# Patient Record
Sex: Female | Born: 1945 | Hispanic: No | Marital: Single | Smoking: Former smoker
Health system: Southern US, Community
[De-identification: ages and names within clinical notes are randomized; demographics above are authoritative.]

## PROBLEM LIST (undated history)

## (undated) DIAGNOSIS — I7381 Erythromelalgia: Secondary | ICD-10-CM

## (undated) DIAGNOSIS — F418 Other specified anxiety disorders: Secondary | ICD-10-CM

## (undated) DIAGNOSIS — C801 Malignant (primary) neoplasm, unspecified: Secondary | ICD-10-CM

## (undated) DIAGNOSIS — R519 Headache, unspecified: Secondary | ICD-10-CM

## (undated) DIAGNOSIS — I82409 Acute embolism and thrombosis of unspecified deep veins of unspecified lower extremity: Secondary | ICD-10-CM

## (undated) DIAGNOSIS — M1990 Unspecified osteoarthritis, unspecified site: Secondary | ICD-10-CM

## (undated) DIAGNOSIS — K5903 Drug induced constipation: Secondary | ICD-10-CM

## (undated) DIAGNOSIS — N189 Chronic kidney disease, unspecified: Secondary | ICD-10-CM

## (undated) DIAGNOSIS — Z9289 Personal history of other medical treatment: Secondary | ICD-10-CM

## (undated) DIAGNOSIS — I499 Cardiac arrhythmia, unspecified: Secondary | ICD-10-CM

## (undated) DIAGNOSIS — K219 Gastro-esophageal reflux disease without esophagitis: Secondary | ICD-10-CM

## (undated) DIAGNOSIS — I959 Hypotension, unspecified: Secondary | ICD-10-CM

## (undated) DIAGNOSIS — I059 Rheumatic mitral valve disease, unspecified: Secondary | ICD-10-CM

## (undated) HISTORY — PX: COLONOSCOPY: SHX174

## (undated) HISTORY — PX: AUGMENTATION MAMMAPLASTY: SUR837

## (undated) HISTORY — PX: EYE SURGERY: SHX253

## (undated) HISTORY — PX: MITRAL VALVE REPAIR: SHX2039

---

## 2011-03-29 HISTORY — PX: TOTAL SHOULDER ARTHROPLASTY: SHX126

## 2015-04-01 DIAGNOSIS — F411 Generalized anxiety disorder: Secondary | ICD-10-CM | POA: Diagnosis not present

## 2015-04-01 DIAGNOSIS — F4310 Post-traumatic stress disorder, unspecified: Secondary | ICD-10-CM | POA: Diagnosis not present

## 2015-04-01 DIAGNOSIS — F331 Major depressive disorder, recurrent, moderate: Secondary | ICD-10-CM | POA: Diagnosis not present

## 2015-04-08 DIAGNOSIS — F411 Generalized anxiety disorder: Secondary | ICD-10-CM | POA: Diagnosis not present

## 2015-04-08 DIAGNOSIS — F331 Major depressive disorder, recurrent, moderate: Secondary | ICD-10-CM | POA: Diagnosis not present

## 2015-04-08 DIAGNOSIS — F4310 Post-traumatic stress disorder, unspecified: Secondary | ICD-10-CM | POA: Diagnosis not present

## 2015-04-15 DIAGNOSIS — F411 Generalized anxiety disorder: Secondary | ICD-10-CM | POA: Diagnosis not present

## 2015-04-15 DIAGNOSIS — F331 Major depressive disorder, recurrent, moderate: Secondary | ICD-10-CM | POA: Diagnosis not present

## 2015-04-15 DIAGNOSIS — F4310 Post-traumatic stress disorder, unspecified: Secondary | ICD-10-CM | POA: Diagnosis not present

## 2015-04-16 DIAGNOSIS — F411 Generalized anxiety disorder: Secondary | ICD-10-CM | POA: Diagnosis not present

## 2015-04-16 DIAGNOSIS — F331 Major depressive disorder, recurrent, moderate: Secondary | ICD-10-CM | POA: Diagnosis not present

## 2015-04-16 DIAGNOSIS — F4310 Post-traumatic stress disorder, unspecified: Secondary | ICD-10-CM | POA: Diagnosis not present

## 2015-04-27 DIAGNOSIS — R001 Bradycardia, unspecified: Secondary | ICD-10-CM | POA: Diagnosis not present

## 2015-04-27 DIAGNOSIS — I4891 Unspecified atrial fibrillation: Secondary | ICD-10-CM | POA: Diagnosis not present

## 2015-04-27 DIAGNOSIS — Z01818 Encounter for other preprocedural examination: Secondary | ICD-10-CM | POA: Diagnosis not present

## 2015-04-27 DIAGNOSIS — Z79891 Long term (current) use of opiate analgesic: Secondary | ICD-10-CM | POA: Diagnosis not present

## 2015-04-29 DIAGNOSIS — F331 Major depressive disorder, recurrent, moderate: Secondary | ICD-10-CM | POA: Diagnosis not present

## 2015-04-29 DIAGNOSIS — F4310 Post-traumatic stress disorder, unspecified: Secondary | ICD-10-CM | POA: Diagnosis not present

## 2015-04-29 DIAGNOSIS — F411 Generalized anxiety disorder: Secondary | ICD-10-CM | POA: Diagnosis not present

## 2015-05-01 DIAGNOSIS — I10 Essential (primary) hypertension: Secondary | ICD-10-CM | POA: Diagnosis not present

## 2015-05-01 DIAGNOSIS — Z4542 Encounter for adjustment and management of neuropacemaker (brain) (peripheral nerve) (spinal cord): Secondary | ICD-10-CM | POA: Diagnosis not present

## 2015-05-01 DIAGNOSIS — K219 Gastro-esophageal reflux disease without esophagitis: Secondary | ICD-10-CM | POA: Diagnosis not present

## 2015-05-01 DIAGNOSIS — I4891 Unspecified atrial fibrillation: Secondary | ICD-10-CM | POA: Diagnosis not present

## 2015-05-01 DIAGNOSIS — Z79899 Other long term (current) drug therapy: Secondary | ICD-10-CM | POA: Diagnosis not present

## 2015-05-01 DIAGNOSIS — Z87891 Personal history of nicotine dependence: Secondary | ICD-10-CM | POA: Diagnosis not present

## 2015-05-01 DIAGNOSIS — Z462 Encounter for fitting and adjustment of other devices related to nervous system and special senses: Secondary | ICD-10-CM | POA: Diagnosis not present

## 2015-05-01 DIAGNOSIS — I7381 Erythromelalgia: Secondary | ICD-10-CM | POA: Diagnosis not present

## 2015-05-01 DIAGNOSIS — F329 Major depressive disorder, single episode, unspecified: Secondary | ICD-10-CM | POA: Diagnosis not present

## 2015-05-01 DIAGNOSIS — M549 Dorsalgia, unspecified: Secondary | ICD-10-CM | POA: Diagnosis not present

## 2015-05-01 DIAGNOSIS — Z7982 Long term (current) use of aspirin: Secondary | ICD-10-CM | POA: Diagnosis not present

## 2015-05-11 DIAGNOSIS — I7381 Erythromelalgia: Secondary | ICD-10-CM | POA: Diagnosis not present

## 2015-05-11 DIAGNOSIS — Z79899 Other long term (current) drug therapy: Secondary | ICD-10-CM | POA: Diagnosis not present

## 2015-05-14 DIAGNOSIS — F411 Generalized anxiety disorder: Secondary | ICD-10-CM | POA: Diagnosis not present

## 2015-05-14 DIAGNOSIS — F331 Major depressive disorder, recurrent, moderate: Secondary | ICD-10-CM | POA: Diagnosis not present

## 2015-05-14 DIAGNOSIS — F4310 Post-traumatic stress disorder, unspecified: Secondary | ICD-10-CM | POA: Diagnosis not present

## 2015-05-28 DIAGNOSIS — F4310 Post-traumatic stress disorder, unspecified: Secondary | ICD-10-CM | POA: Diagnosis not present

## 2015-05-28 DIAGNOSIS — F411 Generalized anxiety disorder: Secondary | ICD-10-CM | POA: Diagnosis not present

## 2015-05-28 DIAGNOSIS — F331 Major depressive disorder, recurrent, moderate: Secondary | ICD-10-CM | POA: Diagnosis not present

## 2015-06-04 DIAGNOSIS — I7381 Erythromelalgia: Secondary | ICD-10-CM | POA: Diagnosis not present

## 2015-06-04 DIAGNOSIS — L821 Other seborrheic keratosis: Secondary | ICD-10-CM | POA: Diagnosis not present

## 2015-06-09 DIAGNOSIS — F4310 Post-traumatic stress disorder, unspecified: Secondary | ICD-10-CM | POA: Diagnosis not present

## 2015-06-09 DIAGNOSIS — F411 Generalized anxiety disorder: Secondary | ICD-10-CM | POA: Diagnosis not present

## 2015-06-09 DIAGNOSIS — F331 Major depressive disorder, recurrent, moderate: Secondary | ICD-10-CM | POA: Diagnosis not present

## 2015-06-12 DIAGNOSIS — M5137 Other intervertebral disc degeneration, lumbosacral region: Secondary | ICD-10-CM | POA: Diagnosis not present

## 2015-06-12 DIAGNOSIS — M5432 Sciatica, left side: Secondary | ICD-10-CM | POA: Diagnosis not present

## 2015-06-12 DIAGNOSIS — Z79891 Long term (current) use of opiate analgesic: Secondary | ICD-10-CM | POA: Diagnosis not present

## 2015-06-12 DIAGNOSIS — M5416 Radiculopathy, lumbar region: Secondary | ICD-10-CM | POA: Diagnosis not present

## 2015-06-12 DIAGNOSIS — M5430 Sciatica, unspecified side: Secondary | ICD-10-CM | POA: Diagnosis not present

## 2015-06-12 DIAGNOSIS — Z9689 Presence of other specified functional implants: Secondary | ICD-10-CM | POA: Diagnosis not present

## 2015-06-12 DIAGNOSIS — M5117 Intervertebral disc disorders with radiculopathy, lumbosacral region: Secondary | ICD-10-CM | POA: Diagnosis not present

## 2015-06-12 DIAGNOSIS — I7381 Erythromelalgia: Secondary | ICD-10-CM | POA: Diagnosis not present

## 2015-06-12 DIAGNOSIS — M4317 Spondylolisthesis, lumbosacral region: Secondary | ICD-10-CM | POA: Diagnosis not present

## 2015-06-17 DIAGNOSIS — F411 Generalized anxiety disorder: Secondary | ICD-10-CM | POA: Diagnosis not present

## 2015-06-17 DIAGNOSIS — F331 Major depressive disorder, recurrent, moderate: Secondary | ICD-10-CM | POA: Diagnosis not present

## 2015-06-17 DIAGNOSIS — F4310 Post-traumatic stress disorder, unspecified: Secondary | ICD-10-CM | POA: Diagnosis not present

## 2015-06-19 DIAGNOSIS — I10 Essential (primary) hypertension: Secondary | ICD-10-CM | POA: Diagnosis not present

## 2015-06-19 DIAGNOSIS — R600 Localized edema: Secondary | ICD-10-CM | POA: Diagnosis not present

## 2015-06-19 DIAGNOSIS — F419 Anxiety disorder, unspecified: Secondary | ICD-10-CM | POA: Diagnosis not present

## 2015-06-24 DIAGNOSIS — F4310 Post-traumatic stress disorder, unspecified: Secondary | ICD-10-CM | POA: Diagnosis not present

## 2015-06-24 DIAGNOSIS — F411 Generalized anxiety disorder: Secondary | ICD-10-CM | POA: Diagnosis not present

## 2015-06-24 DIAGNOSIS — F331 Major depressive disorder, recurrent, moderate: Secondary | ICD-10-CM | POA: Diagnosis not present

## 2015-06-26 DIAGNOSIS — F4310 Post-traumatic stress disorder, unspecified: Secondary | ICD-10-CM | POA: Diagnosis not present

## 2015-06-26 DIAGNOSIS — F411 Generalized anxiety disorder: Secondary | ICD-10-CM | POA: Diagnosis not present

## 2015-06-26 DIAGNOSIS — F331 Major depressive disorder, recurrent, moderate: Secondary | ICD-10-CM | POA: Diagnosis not present

## 2015-06-27 DIAGNOSIS — R4182 Altered mental status, unspecified: Secondary | ICD-10-CM | POA: Diagnosis not present

## 2015-06-27 DIAGNOSIS — S0990XA Unspecified injury of head, initial encounter: Secondary | ICD-10-CM | POA: Diagnosis not present

## 2015-06-27 DIAGNOSIS — Z87891 Personal history of nicotine dependence: Secondary | ICD-10-CM | POA: Diagnosis not present

## 2015-06-27 DIAGNOSIS — M25559 Pain in unspecified hip: Secondary | ICD-10-CM | POA: Diagnosis not present

## 2015-06-27 DIAGNOSIS — S299XXA Unspecified injury of thorax, initial encounter: Secondary | ICD-10-CM | POA: Diagnosis not present

## 2015-06-27 DIAGNOSIS — R220 Localized swelling, mass and lump, head: Secondary | ICD-10-CM | POA: Diagnosis not present

## 2015-06-27 DIAGNOSIS — Z79899 Other long term (current) drug therapy: Secondary | ICD-10-CM | POA: Diagnosis not present

## 2015-06-27 DIAGNOSIS — S199XXA Unspecified injury of neck, initial encounter: Secondary | ICD-10-CM | POA: Diagnosis not present

## 2015-06-27 DIAGNOSIS — M8580 Other specified disorders of bone density and structure, unspecified site: Secondary | ICD-10-CM | POA: Diagnosis not present

## 2015-06-27 DIAGNOSIS — R443 Hallucinations, unspecified: Secondary | ICD-10-CM | POA: Diagnosis not present

## 2015-06-27 DIAGNOSIS — G8929 Other chronic pain: Secondary | ICD-10-CM | POA: Diagnosis not present

## 2015-06-30 DIAGNOSIS — F331 Major depressive disorder, recurrent, moderate: Secondary | ICD-10-CM | POA: Diagnosis not present

## 2015-06-30 DIAGNOSIS — F4310 Post-traumatic stress disorder, unspecified: Secondary | ICD-10-CM | POA: Diagnosis not present

## 2015-06-30 DIAGNOSIS — F411 Generalized anxiety disorder: Secondary | ICD-10-CM | POA: Diagnosis not present

## 2015-07-07 DIAGNOSIS — Z5181 Encounter for therapeutic drug level monitoring: Secondary | ICD-10-CM | POA: Diagnosis not present

## 2015-07-07 DIAGNOSIS — G894 Chronic pain syndrome: Secondary | ICD-10-CM | POA: Diagnosis not present

## 2015-07-07 DIAGNOSIS — I7381 Erythromelalgia: Secondary | ICD-10-CM | POA: Diagnosis not present

## 2015-07-07 DIAGNOSIS — Z79899 Other long term (current) drug therapy: Secondary | ICD-10-CM | POA: Diagnosis not present

## 2015-07-10 DIAGNOSIS — F331 Major depressive disorder, recurrent, moderate: Secondary | ICD-10-CM | POA: Diagnosis not present

## 2015-07-10 DIAGNOSIS — F411 Generalized anxiety disorder: Secondary | ICD-10-CM | POA: Diagnosis not present

## 2015-07-10 DIAGNOSIS — F4310 Post-traumatic stress disorder, unspecified: Secondary | ICD-10-CM | POA: Diagnosis not present

## 2015-07-20 DIAGNOSIS — F4310 Post-traumatic stress disorder, unspecified: Secondary | ICD-10-CM | POA: Diagnosis not present

## 2015-07-20 DIAGNOSIS — F411 Generalized anxiety disorder: Secondary | ICD-10-CM | POA: Diagnosis not present

## 2015-07-20 DIAGNOSIS — F331 Major depressive disorder, recurrent, moderate: Secondary | ICD-10-CM | POA: Diagnosis not present

## 2015-07-28 DIAGNOSIS — F411 Generalized anxiety disorder: Secondary | ICD-10-CM | POA: Diagnosis not present

## 2015-07-28 DIAGNOSIS — F331 Major depressive disorder, recurrent, moderate: Secondary | ICD-10-CM | POA: Diagnosis not present

## 2015-07-28 DIAGNOSIS — F4310 Post-traumatic stress disorder, unspecified: Secondary | ICD-10-CM | POA: Diagnosis not present

## 2015-08-03 DIAGNOSIS — H43393 Other vitreous opacities, bilateral: Secondary | ICD-10-CM | POA: Diagnosis not present

## 2015-08-03 DIAGNOSIS — Z821 Family history of blindness and visual loss: Secondary | ICD-10-CM | POA: Diagnosis not present

## 2015-08-03 DIAGNOSIS — H2513 Age-related nuclear cataract, bilateral: Secondary | ICD-10-CM | POA: Diagnosis not present

## 2015-08-07 DIAGNOSIS — Z79899 Other long term (current) drug therapy: Secondary | ICD-10-CM | POA: Diagnosis not present

## 2015-08-07 DIAGNOSIS — I7381 Erythromelalgia: Secondary | ICD-10-CM | POA: Diagnosis not present

## 2015-08-07 DIAGNOSIS — F411 Generalized anxiety disorder: Secondary | ICD-10-CM | POA: Diagnosis not present

## 2015-08-07 DIAGNOSIS — Z79891 Long term (current) use of opiate analgesic: Secondary | ICD-10-CM | POA: Diagnosis not present

## 2015-08-07 DIAGNOSIS — M5432 Sciatica, left side: Secondary | ICD-10-CM | POA: Diagnosis not present

## 2015-08-07 DIAGNOSIS — M5416 Radiculopathy, lumbar region: Secondary | ICD-10-CM | POA: Diagnosis not present

## 2015-08-07 DIAGNOSIS — M4316 Spondylolisthesis, lumbar region: Secondary | ICD-10-CM | POA: Diagnosis not present

## 2015-08-07 DIAGNOSIS — F4310 Post-traumatic stress disorder, unspecified: Secondary | ICD-10-CM | POA: Diagnosis not present

## 2015-08-07 DIAGNOSIS — M5136 Other intervertebral disc degeneration, lumbar region: Secondary | ICD-10-CM | POA: Diagnosis not present

## 2015-08-07 DIAGNOSIS — M5442 Lumbago with sciatica, left side: Secondary | ICD-10-CM | POA: Diagnosis not present

## 2015-08-07 DIAGNOSIS — F331 Major depressive disorder, recurrent, moderate: Secondary | ICD-10-CM | POA: Diagnosis not present

## 2015-08-07 DIAGNOSIS — M4317 Spondylolisthesis, lumbosacral region: Secondary | ICD-10-CM | POA: Diagnosis not present

## 2015-08-07 DIAGNOSIS — M5117 Intervertebral disc disorders with radiculopathy, lumbosacral region: Secondary | ICD-10-CM | POA: Diagnosis not present

## 2015-08-07 DIAGNOSIS — Z9689 Presence of other specified functional implants: Secondary | ICD-10-CM | POA: Diagnosis not present

## 2015-08-11 DIAGNOSIS — G894 Chronic pain syndrome: Secondary | ICD-10-CM | POA: Diagnosis not present

## 2015-08-11 DIAGNOSIS — I7381 Erythromelalgia: Secondary | ICD-10-CM | POA: Diagnosis not present

## 2015-08-14 DIAGNOSIS — F331 Major depressive disorder, recurrent, moderate: Secondary | ICD-10-CM | POA: Diagnosis not present

## 2015-08-14 DIAGNOSIS — F411 Generalized anxiety disorder: Secondary | ICD-10-CM | POA: Diagnosis not present

## 2015-08-14 DIAGNOSIS — F4310 Post-traumatic stress disorder, unspecified: Secondary | ICD-10-CM | POA: Diagnosis not present

## 2015-08-17 DIAGNOSIS — M79604 Pain in right leg: Secondary | ICD-10-CM | POA: Diagnosis not present

## 2015-08-17 DIAGNOSIS — I10 Essential (primary) hypertension: Secondary | ICD-10-CM | POA: Diagnosis not present

## 2015-08-17 DIAGNOSIS — F329 Major depressive disorder, single episode, unspecified: Secondary | ICD-10-CM | POA: Diagnosis not present

## 2015-08-20 DIAGNOSIS — F4310 Post-traumatic stress disorder, unspecified: Secondary | ICD-10-CM | POA: Diagnosis not present

## 2015-08-20 DIAGNOSIS — F411 Generalized anxiety disorder: Secondary | ICD-10-CM | POA: Diagnosis not present

## 2015-08-20 DIAGNOSIS — F331 Major depressive disorder, recurrent, moderate: Secondary | ICD-10-CM | POA: Diagnosis not present

## 2015-08-21 DIAGNOSIS — F331 Major depressive disorder, recurrent, moderate: Secondary | ICD-10-CM | POA: Diagnosis not present

## 2015-08-21 DIAGNOSIS — F411 Generalized anxiety disorder: Secondary | ICD-10-CM | POA: Diagnosis not present

## 2015-08-21 DIAGNOSIS — F4310 Post-traumatic stress disorder, unspecified: Secondary | ICD-10-CM | POA: Diagnosis not present

## 2015-08-25 DIAGNOSIS — I7381 Erythromelalgia: Secondary | ICD-10-CM | POA: Diagnosis not present

## 2015-08-25 DIAGNOSIS — G894 Chronic pain syndrome: Secondary | ICD-10-CM | POA: Diagnosis not present

## 2015-09-01 DIAGNOSIS — F411 Generalized anxiety disorder: Secondary | ICD-10-CM | POA: Diagnosis not present

## 2015-09-01 DIAGNOSIS — F4310 Post-traumatic stress disorder, unspecified: Secondary | ICD-10-CM | POA: Diagnosis not present

## 2015-09-01 DIAGNOSIS — F331 Major depressive disorder, recurrent, moderate: Secondary | ICD-10-CM | POA: Diagnosis not present

## 2015-09-04 DIAGNOSIS — F331 Major depressive disorder, recurrent, moderate: Secondary | ICD-10-CM | POA: Diagnosis not present

## 2015-09-04 DIAGNOSIS — F411 Generalized anxiety disorder: Secondary | ICD-10-CM | POA: Diagnosis not present

## 2015-09-04 DIAGNOSIS — F4310 Post-traumatic stress disorder, unspecified: Secondary | ICD-10-CM | POA: Diagnosis not present

## 2015-09-16 DIAGNOSIS — F411 Generalized anxiety disorder: Secondary | ICD-10-CM | POA: Diagnosis not present

## 2015-09-16 DIAGNOSIS — F4310 Post-traumatic stress disorder, unspecified: Secondary | ICD-10-CM | POA: Diagnosis not present

## 2015-09-16 DIAGNOSIS — F331 Major depressive disorder, recurrent, moderate: Secondary | ICD-10-CM | POA: Diagnosis not present

## 2015-09-22 DIAGNOSIS — I7381 Erythromelalgia: Secondary | ICD-10-CM | POA: Diagnosis not present

## 2015-09-22 DIAGNOSIS — M25572 Pain in left ankle and joints of left foot: Secondary | ICD-10-CM | POA: Diagnosis not present

## 2015-09-22 DIAGNOSIS — G894 Chronic pain syndrome: Secondary | ICD-10-CM | POA: Diagnosis not present

## 2015-09-25 DIAGNOSIS — M25472 Effusion, left ankle: Secondary | ICD-10-CM | POA: Diagnosis not present

## 2015-09-25 DIAGNOSIS — M25572 Pain in left ankle and joints of left foot: Secondary | ICD-10-CM | POA: Diagnosis not present

## 2015-10-02 DIAGNOSIS — M5416 Radiculopathy, lumbar region: Secondary | ICD-10-CM | POA: Diagnosis not present

## 2015-10-02 DIAGNOSIS — I7381 Erythromelalgia: Secondary | ICD-10-CM | POA: Diagnosis not present

## 2015-10-02 DIAGNOSIS — M5116 Intervertebral disc disorders with radiculopathy, lumbar region: Secondary | ICD-10-CM | POA: Diagnosis not present

## 2015-10-02 DIAGNOSIS — Z87891 Personal history of nicotine dependence: Secondary | ICD-10-CM | POA: Diagnosis not present

## 2015-10-02 DIAGNOSIS — Z79899 Other long term (current) drug therapy: Secondary | ICD-10-CM | POA: Diagnosis not present

## 2015-10-02 DIAGNOSIS — Z9689 Presence of other specified functional implants: Secondary | ICD-10-CM | POA: Diagnosis not present

## 2015-10-02 DIAGNOSIS — M5137 Other intervertebral disc degeneration, lumbosacral region: Secondary | ICD-10-CM | POA: Diagnosis not present

## 2015-10-02 DIAGNOSIS — M5432 Sciatica, left side: Secondary | ICD-10-CM | POA: Diagnosis not present

## 2015-10-06 DIAGNOSIS — F4310 Post-traumatic stress disorder, unspecified: Secondary | ICD-10-CM | POA: Diagnosis not present

## 2015-10-06 DIAGNOSIS — F411 Generalized anxiety disorder: Secondary | ICD-10-CM | POA: Diagnosis not present

## 2015-10-06 DIAGNOSIS — F331 Major depressive disorder, recurrent, moderate: Secondary | ICD-10-CM | POA: Diagnosis not present

## 2015-10-16 DIAGNOSIS — F4310 Post-traumatic stress disorder, unspecified: Secondary | ICD-10-CM | POA: Diagnosis not present

## 2015-10-16 DIAGNOSIS — F411 Generalized anxiety disorder: Secondary | ICD-10-CM | POA: Diagnosis not present

## 2015-10-16 DIAGNOSIS — F331 Major depressive disorder, recurrent, moderate: Secondary | ICD-10-CM | POA: Diagnosis not present

## 2015-11-04 DIAGNOSIS — F411 Generalized anxiety disorder: Secondary | ICD-10-CM | POA: Diagnosis not present

## 2015-11-04 DIAGNOSIS — F4310 Post-traumatic stress disorder, unspecified: Secondary | ICD-10-CM | POA: Diagnosis not present

## 2015-11-04 DIAGNOSIS — F331 Major depressive disorder, recurrent, moderate: Secondary | ICD-10-CM | POA: Diagnosis not present

## 2015-11-09 DIAGNOSIS — F329 Major depressive disorder, single episode, unspecified: Secondary | ICD-10-CM | POA: Diagnosis not present

## 2015-11-09 DIAGNOSIS — I10 Essential (primary) hypertension: Secondary | ICD-10-CM | POA: Diagnosis not present

## 2015-11-09 DIAGNOSIS — Z888 Allergy status to other drugs, medicaments and biological substances status: Secondary | ICD-10-CM | POA: Diagnosis not present

## 2015-11-09 DIAGNOSIS — G894 Chronic pain syndrome: Secondary | ICD-10-CM | POA: Diagnosis not present

## 2015-11-09 DIAGNOSIS — Z7982 Long term (current) use of aspirin: Secondary | ICD-10-CM | POA: Diagnosis not present

## 2015-11-09 DIAGNOSIS — I7381 Erythromelalgia: Secondary | ICD-10-CM | POA: Diagnosis not present

## 2015-11-09 DIAGNOSIS — Z87891 Personal history of nicotine dependence: Secondary | ICD-10-CM | POA: Diagnosis not present

## 2015-11-09 DIAGNOSIS — Z880 Allergy status to penicillin: Secondary | ICD-10-CM | POA: Diagnosis not present

## 2015-11-09 DIAGNOSIS — M1990 Unspecified osteoarthritis, unspecified site: Secondary | ICD-10-CM | POA: Diagnosis not present

## 2015-11-09 DIAGNOSIS — Z885 Allergy status to narcotic agent status: Secondary | ICD-10-CM | POA: Diagnosis not present

## 2015-11-09 DIAGNOSIS — Z79891 Long term (current) use of opiate analgesic: Secondary | ICD-10-CM | POA: Diagnosis not present

## 2015-11-09 DIAGNOSIS — M546 Pain in thoracic spine: Secondary | ICD-10-CM | POA: Diagnosis not present

## 2015-11-09 DIAGNOSIS — G8929 Other chronic pain: Secondary | ICD-10-CM | POA: Diagnosis not present

## 2015-11-09 DIAGNOSIS — Z881 Allergy status to other antibiotic agents status: Secondary | ICD-10-CM | POA: Diagnosis not present

## 2015-11-09 DIAGNOSIS — Z79899 Other long term (current) drug therapy: Secondary | ICD-10-CM | POA: Diagnosis not present

## 2015-11-09 DIAGNOSIS — K219 Gastro-esophageal reflux disease without esophagitis: Secondary | ICD-10-CM | POA: Diagnosis not present

## 2015-11-09 DIAGNOSIS — I4891 Unspecified atrial fibrillation: Secondary | ICD-10-CM | POA: Diagnosis not present

## 2015-11-09 DIAGNOSIS — Z791 Long term (current) use of non-steroidal anti-inflammatories (NSAID): Secondary | ICD-10-CM | POA: Diagnosis not present

## 2015-11-10 DIAGNOSIS — G894 Chronic pain syndrome: Secondary | ICD-10-CM | POA: Diagnosis not present

## 2015-11-10 DIAGNOSIS — I10 Essential (primary) hypertension: Secondary | ICD-10-CM | POA: Diagnosis not present

## 2015-11-10 DIAGNOSIS — I7381 Erythromelalgia: Secondary | ICD-10-CM | POA: Diagnosis not present

## 2015-11-10 DIAGNOSIS — F329 Major depressive disorder, single episode, unspecified: Secondary | ICD-10-CM | POA: Diagnosis not present

## 2015-11-10 DIAGNOSIS — I4891 Unspecified atrial fibrillation: Secondary | ICD-10-CM | POA: Diagnosis not present

## 2015-11-10 DIAGNOSIS — Z87891 Personal history of nicotine dependence: Secondary | ICD-10-CM | POA: Diagnosis not present

## 2015-11-17 DIAGNOSIS — G894 Chronic pain syndrome: Secondary | ICD-10-CM | POA: Diagnosis not present

## 2015-11-17 DIAGNOSIS — I7381 Erythromelalgia: Secondary | ICD-10-CM | POA: Diagnosis not present

## 2015-11-17 DIAGNOSIS — Z9689 Presence of other specified functional implants: Secondary | ICD-10-CM | POA: Diagnosis not present

## 2015-11-18 DIAGNOSIS — K5900 Constipation, unspecified: Secondary | ICD-10-CM | POA: Diagnosis not present

## 2015-11-18 DIAGNOSIS — N644 Mastodynia: Secondary | ICD-10-CM | POA: Diagnosis not present

## 2015-11-18 DIAGNOSIS — I10 Essential (primary) hypertension: Secondary | ICD-10-CM | POA: Diagnosis not present

## 2015-11-26 DIAGNOSIS — F411 Generalized anxiety disorder: Secondary | ICD-10-CM | POA: Diagnosis not present

## 2015-11-26 DIAGNOSIS — F331 Major depressive disorder, recurrent, moderate: Secondary | ICD-10-CM | POA: Diagnosis not present

## 2015-11-26 DIAGNOSIS — F4310 Post-traumatic stress disorder, unspecified: Secondary | ICD-10-CM | POA: Diagnosis not present

## 2015-11-27 DIAGNOSIS — Z79891 Long term (current) use of opiate analgesic: Secondary | ICD-10-CM | POA: Diagnosis not present

## 2015-11-27 DIAGNOSIS — M5430 Sciatica, unspecified side: Secondary | ICD-10-CM | POA: Diagnosis not present

## 2015-11-27 DIAGNOSIS — M5117 Intervertebral disc disorders with radiculopathy, lumbosacral region: Secondary | ICD-10-CM | POA: Diagnosis not present

## 2015-11-27 DIAGNOSIS — Z87891 Personal history of nicotine dependence: Secondary | ICD-10-CM | POA: Diagnosis not present

## 2015-11-27 DIAGNOSIS — Z9689 Presence of other specified functional implants: Secondary | ICD-10-CM | POA: Diagnosis not present

## 2015-11-27 DIAGNOSIS — M5416 Radiculopathy, lumbar region: Secondary | ICD-10-CM | POA: Diagnosis not present

## 2015-11-27 DIAGNOSIS — I7381 Erythromelalgia: Secondary | ICD-10-CM | POA: Diagnosis not present

## 2015-11-27 DIAGNOSIS — Z79899 Other long term (current) drug therapy: Secondary | ICD-10-CM | POA: Diagnosis not present

## 2015-11-27 DIAGNOSIS — M4317 Spondylolisthesis, lumbosacral region: Secondary | ICD-10-CM | POA: Diagnosis not present

## 2015-11-27 DIAGNOSIS — M5137 Other intervertebral disc degeneration, lumbosacral region: Secondary | ICD-10-CM | POA: Diagnosis not present

## 2015-12-08 DIAGNOSIS — I7381 Erythromelalgia: Secondary | ICD-10-CM | POA: Diagnosis not present

## 2015-12-08 DIAGNOSIS — Z9689 Presence of other specified functional implants: Secondary | ICD-10-CM | POA: Diagnosis not present

## 2015-12-08 DIAGNOSIS — G894 Chronic pain syndrome: Secondary | ICD-10-CM | POA: Diagnosis not present

## 2015-12-18 DIAGNOSIS — M545 Low back pain: Secondary | ICD-10-CM | POA: Diagnosis not present

## 2015-12-18 DIAGNOSIS — E559 Vitamin D deficiency, unspecified: Secondary | ICD-10-CM | POA: Diagnosis not present

## 2015-12-18 DIAGNOSIS — I10 Essential (primary) hypertension: Secondary | ICD-10-CM | POA: Diagnosis not present

## 2015-12-23 DIAGNOSIS — F331 Major depressive disorder, recurrent, moderate: Secondary | ICD-10-CM | POA: Diagnosis not present

## 2015-12-23 DIAGNOSIS — F4310 Post-traumatic stress disorder, unspecified: Secondary | ICD-10-CM | POA: Diagnosis not present

## 2015-12-23 DIAGNOSIS — F411 Generalized anxiety disorder: Secondary | ICD-10-CM | POA: Diagnosis not present

## 2015-12-24 DIAGNOSIS — Z9889 Other specified postprocedural states: Secondary | ICD-10-CM | POA: Diagnosis not present

## 2015-12-24 DIAGNOSIS — I10 Essential (primary) hypertension: Secondary | ICD-10-CM | POA: Diagnosis not present

## 2015-12-24 DIAGNOSIS — I480 Paroxysmal atrial fibrillation: Secondary | ICD-10-CM | POA: Diagnosis not present

## 2015-12-29 DIAGNOSIS — I7381 Erythromelalgia: Secondary | ICD-10-CM | POA: Diagnosis not present

## 2015-12-29 DIAGNOSIS — Z9689 Presence of other specified functional implants: Secondary | ICD-10-CM | POA: Diagnosis not present

## 2015-12-29 DIAGNOSIS — Z5181 Encounter for therapeutic drug level monitoring: Secondary | ICD-10-CM | POA: Diagnosis not present

## 2015-12-29 DIAGNOSIS — G894 Chronic pain syndrome: Secondary | ICD-10-CM | POA: Diagnosis not present

## 2015-12-29 DIAGNOSIS — Z79899 Other long term (current) drug therapy: Secondary | ICD-10-CM | POA: Diagnosis not present

## 2015-12-31 DIAGNOSIS — F4310 Post-traumatic stress disorder, unspecified: Secondary | ICD-10-CM | POA: Diagnosis not present

## 2015-12-31 DIAGNOSIS — F331 Major depressive disorder, recurrent, moderate: Secondary | ICD-10-CM | POA: Diagnosis not present

## 2015-12-31 DIAGNOSIS — F411 Generalized anxiety disorder: Secondary | ICD-10-CM | POA: Diagnosis not present

## 2016-01-01 DIAGNOSIS — Z87891 Personal history of nicotine dependence: Secondary | ICD-10-CM | POA: Diagnosis not present

## 2016-01-01 DIAGNOSIS — M8588 Other specified disorders of bone density and structure, other site: Secondary | ICD-10-CM | POA: Diagnosis not present

## 2016-01-01 DIAGNOSIS — Z880 Allergy status to penicillin: Secondary | ICD-10-CM | POA: Diagnosis not present

## 2016-01-01 DIAGNOSIS — M4316 Spondylolisthesis, lumbar region: Secondary | ICD-10-CM | POA: Diagnosis not present

## 2016-01-01 DIAGNOSIS — M47817 Spondylosis without myelopathy or radiculopathy, lumbosacral region: Secondary | ICD-10-CM | POA: Diagnosis not present

## 2016-01-01 DIAGNOSIS — Z90710 Acquired absence of both cervix and uterus: Secondary | ICD-10-CM | POA: Diagnosis not present

## 2016-01-01 DIAGNOSIS — Z79899 Other long term (current) drug therapy: Secondary | ICD-10-CM | POA: Diagnosis not present

## 2016-01-01 DIAGNOSIS — M5116 Intervertebral disc disorders with radiculopathy, lumbar region: Secondary | ICD-10-CM | POA: Diagnosis not present

## 2016-01-01 DIAGNOSIS — M461 Sacroiliitis, not elsewhere classified: Secondary | ICD-10-CM | POA: Diagnosis not present

## 2016-01-01 DIAGNOSIS — Z888 Allergy status to other drugs, medicaments and biological substances status: Secondary | ICD-10-CM | POA: Diagnosis not present

## 2016-01-01 DIAGNOSIS — Z885 Allergy status to narcotic agent status: Secondary | ICD-10-CM | POA: Diagnosis not present

## 2016-01-01 DIAGNOSIS — Z7982 Long term (current) use of aspirin: Secondary | ICD-10-CM | POA: Diagnosis not present

## 2016-01-01 DIAGNOSIS — Z79891 Long term (current) use of opiate analgesic: Secondary | ICD-10-CM | POA: Diagnosis not present

## 2016-01-01 DIAGNOSIS — Z8679 Personal history of other diseases of the circulatory system: Secondary | ICD-10-CM | POA: Diagnosis not present

## 2016-01-01 DIAGNOSIS — Z96611 Presence of right artificial shoulder joint: Secondary | ICD-10-CM | POA: Diagnosis not present

## 2016-01-01 DIAGNOSIS — I10 Essential (primary) hypertension: Secondary | ICD-10-CM | POA: Diagnosis not present

## 2016-01-01 DIAGNOSIS — I7381 Erythromelalgia: Secondary | ICD-10-CM | POA: Diagnosis not present

## 2016-01-06 DIAGNOSIS — F331 Major depressive disorder, recurrent, moderate: Secondary | ICD-10-CM | POA: Diagnosis not present

## 2016-01-06 DIAGNOSIS — F4310 Post-traumatic stress disorder, unspecified: Secondary | ICD-10-CM | POA: Diagnosis not present

## 2016-01-06 DIAGNOSIS — F411 Generalized anxiety disorder: Secondary | ICD-10-CM | POA: Diagnosis not present

## 2016-01-07 DIAGNOSIS — Z01818 Encounter for other preprocedural examination: Secondary | ICD-10-CM | POA: Diagnosis not present

## 2016-01-07 DIAGNOSIS — Z5181 Encounter for therapeutic drug level monitoring: Secondary | ICD-10-CM | POA: Diagnosis not present

## 2016-01-07 DIAGNOSIS — Z79899 Other long term (current) drug therapy: Secondary | ICD-10-CM | POA: Diagnosis not present

## 2016-01-13 DIAGNOSIS — F4310 Post-traumatic stress disorder, unspecified: Secondary | ICD-10-CM | POA: Diagnosis not present

## 2016-01-13 DIAGNOSIS — F331 Major depressive disorder, recurrent, moderate: Secondary | ICD-10-CM | POA: Diagnosis not present

## 2016-01-13 DIAGNOSIS — F411 Generalized anxiety disorder: Secondary | ICD-10-CM | POA: Diagnosis not present

## 2016-01-15 DIAGNOSIS — Z87891 Personal history of nicotine dependence: Secondary | ICD-10-CM | POA: Diagnosis not present

## 2016-01-15 DIAGNOSIS — Z462 Encounter for fitting and adjustment of other devices related to nervous system and special senses: Secondary | ICD-10-CM | POA: Diagnosis not present

## 2016-01-15 DIAGNOSIS — Z79899 Other long term (current) drug therapy: Secondary | ICD-10-CM | POA: Diagnosis not present

## 2016-01-15 DIAGNOSIS — M47817 Spondylosis without myelopathy or radiculopathy, lumbosacral region: Secondary | ICD-10-CM | POA: Diagnosis not present

## 2016-01-15 DIAGNOSIS — M5136 Other intervertebral disc degeneration, lumbar region: Secondary | ICD-10-CM | POA: Diagnosis not present

## 2016-01-15 DIAGNOSIS — I7381 Erythromelalgia: Secondary | ICD-10-CM | POA: Diagnosis not present

## 2016-01-15 DIAGNOSIS — G8929 Other chronic pain: Secondary | ICD-10-CM | POA: Diagnosis not present

## 2016-01-15 DIAGNOSIS — M19019 Primary osteoarthritis, unspecified shoulder: Secondary | ICD-10-CM | POA: Diagnosis not present

## 2016-01-15 DIAGNOSIS — Z880 Allergy status to penicillin: Secondary | ICD-10-CM | POA: Diagnosis not present

## 2016-01-15 DIAGNOSIS — Z881 Allergy status to other antibiotic agents status: Secondary | ICD-10-CM | POA: Diagnosis not present

## 2016-01-15 DIAGNOSIS — I10 Essential (primary) hypertension: Secondary | ICD-10-CM | POA: Diagnosis not present

## 2016-01-15 DIAGNOSIS — Z7982 Long term (current) use of aspirin: Secondary | ICD-10-CM | POA: Diagnosis not present

## 2016-01-15 DIAGNOSIS — Z885 Allergy status to narcotic agent status: Secondary | ICD-10-CM | POA: Diagnosis not present

## 2016-01-18 DIAGNOSIS — Z01810 Encounter for preprocedural cardiovascular examination: Secondary | ICD-10-CM | POA: Diagnosis not present

## 2016-01-20 DIAGNOSIS — F331 Major depressive disorder, recurrent, moderate: Secondary | ICD-10-CM | POA: Diagnosis not present

## 2016-01-20 DIAGNOSIS — F411 Generalized anxiety disorder: Secondary | ICD-10-CM | POA: Diagnosis not present

## 2016-01-20 DIAGNOSIS — F4310 Post-traumatic stress disorder, unspecified: Secondary | ICD-10-CM | POA: Diagnosis not present

## 2016-01-27 DIAGNOSIS — F331 Major depressive disorder, recurrent, moderate: Secondary | ICD-10-CM | POA: Diagnosis not present

## 2016-01-27 DIAGNOSIS — F4310 Post-traumatic stress disorder, unspecified: Secondary | ICD-10-CM | POA: Diagnosis not present

## 2016-01-27 DIAGNOSIS — F411 Generalized anxiety disorder: Secondary | ICD-10-CM | POA: Diagnosis not present

## 2016-02-02 DIAGNOSIS — F4310 Post-traumatic stress disorder, unspecified: Secondary | ICD-10-CM | POA: Diagnosis not present

## 2016-02-02 DIAGNOSIS — F411 Generalized anxiety disorder: Secondary | ICD-10-CM | POA: Diagnosis not present

## 2016-02-02 DIAGNOSIS — F331 Major depressive disorder, recurrent, moderate: Secondary | ICD-10-CM | POA: Diagnosis not present

## 2016-02-08 DIAGNOSIS — M720 Palmar fascial fibromatosis [Dupuytren]: Secondary | ICD-10-CM | POA: Diagnosis not present

## 2016-02-08 DIAGNOSIS — M778 Other enthesopathies, not elsewhere classified: Secondary | ICD-10-CM | POA: Diagnosis not present

## 2016-02-09 DIAGNOSIS — F331 Major depressive disorder, recurrent, moderate: Secondary | ICD-10-CM | POA: Diagnosis not present

## 2016-02-09 DIAGNOSIS — F4310 Post-traumatic stress disorder, unspecified: Secondary | ICD-10-CM | POA: Diagnosis not present

## 2016-02-09 DIAGNOSIS — F411 Generalized anxiety disorder: Secondary | ICD-10-CM | POA: Diagnosis not present

## 2016-02-15 DIAGNOSIS — I7381 Erythromelalgia: Secondary | ICD-10-CM | POA: Diagnosis not present

## 2016-02-15 DIAGNOSIS — M792 Neuralgia and neuritis, unspecified: Secondary | ICD-10-CM | POA: Diagnosis not present

## 2016-02-15 DIAGNOSIS — Z9689 Presence of other specified functional implants: Secondary | ICD-10-CM | POA: Diagnosis not present

## 2016-02-15 DIAGNOSIS — G894 Chronic pain syndrome: Secondary | ICD-10-CM | POA: Diagnosis not present

## 2016-02-16 DIAGNOSIS — F411 Generalized anxiety disorder: Secondary | ICD-10-CM | POA: Diagnosis not present

## 2016-02-16 DIAGNOSIS — F4310 Post-traumatic stress disorder, unspecified: Secondary | ICD-10-CM | POA: Diagnosis not present

## 2016-02-16 DIAGNOSIS — F331 Major depressive disorder, recurrent, moderate: Secondary | ICD-10-CM | POA: Diagnosis not present

## 2016-02-16 DIAGNOSIS — I4891 Unspecified atrial fibrillation: Secondary | ICD-10-CM | POA: Diagnosis not present

## 2016-02-24 DIAGNOSIS — F4310 Post-traumatic stress disorder, unspecified: Secondary | ICD-10-CM | POA: Diagnosis not present

## 2016-02-24 DIAGNOSIS — F331 Major depressive disorder, recurrent, moderate: Secondary | ICD-10-CM | POA: Diagnosis not present

## 2016-02-24 DIAGNOSIS — F411 Generalized anxiety disorder: Secondary | ICD-10-CM | POA: Diagnosis not present

## 2016-03-07 DIAGNOSIS — M5137 Other intervertebral disc degeneration, lumbosacral region: Secondary | ICD-10-CM | POA: Diagnosis not present

## 2016-03-07 DIAGNOSIS — G8929 Other chronic pain: Secondary | ICD-10-CM | POA: Diagnosis not present

## 2016-03-07 DIAGNOSIS — M5432 Sciatica, left side: Secondary | ICD-10-CM | POA: Diagnosis not present

## 2016-03-07 DIAGNOSIS — Z79899 Other long term (current) drug therapy: Secondary | ICD-10-CM | POA: Diagnosis not present

## 2016-03-07 DIAGNOSIS — M5116 Intervertebral disc disorders with radiculopathy, lumbar region: Secondary | ICD-10-CM | POA: Diagnosis not present

## 2016-03-07 DIAGNOSIS — I7381 Erythromelalgia: Secondary | ICD-10-CM | POA: Diagnosis not present

## 2016-03-07 DIAGNOSIS — M5416 Radiculopathy, lumbar region: Secondary | ICD-10-CM | POA: Diagnosis not present

## 2016-03-13 DIAGNOSIS — Z9889 Other specified postprocedural states: Secondary | ICD-10-CM | POA: Diagnosis not present

## 2016-03-13 DIAGNOSIS — I480 Paroxysmal atrial fibrillation: Secondary | ICD-10-CM | POA: Diagnosis not present

## 2016-03-18 DIAGNOSIS — I4891 Unspecified atrial fibrillation: Secondary | ICD-10-CM | POA: Diagnosis not present

## 2016-04-04 DIAGNOSIS — G894 Chronic pain syndrome: Secondary | ICD-10-CM | POA: Diagnosis not present

## 2016-04-04 DIAGNOSIS — M792 Neuralgia and neuritis, unspecified: Secondary | ICD-10-CM | POA: Diagnosis not present

## 2016-04-04 DIAGNOSIS — Z9689 Presence of other specified functional implants: Secondary | ICD-10-CM | POA: Diagnosis not present

## 2016-04-04 DIAGNOSIS — I7381 Erythromelalgia: Secondary | ICD-10-CM | POA: Diagnosis not present

## 2016-04-06 DIAGNOSIS — F331 Major depressive disorder, recurrent, moderate: Secondary | ICD-10-CM | POA: Diagnosis not present

## 2016-04-06 DIAGNOSIS — F4310 Post-traumatic stress disorder, unspecified: Secondary | ICD-10-CM | POA: Diagnosis not present

## 2016-04-06 DIAGNOSIS — F411 Generalized anxiety disorder: Secondary | ICD-10-CM | POA: Diagnosis not present

## 2016-04-26 DIAGNOSIS — F4310 Post-traumatic stress disorder, unspecified: Secondary | ICD-10-CM | POA: Diagnosis not present

## 2016-04-26 DIAGNOSIS — F331 Major depressive disorder, recurrent, moderate: Secondary | ICD-10-CM | POA: Diagnosis not present

## 2016-04-26 DIAGNOSIS — F411 Generalized anxiety disorder: Secondary | ICD-10-CM | POA: Diagnosis not present

## 2016-05-04 DIAGNOSIS — F4310 Post-traumatic stress disorder, unspecified: Secondary | ICD-10-CM | POA: Diagnosis not present

## 2016-05-04 DIAGNOSIS — F411 Generalized anxiety disorder: Secondary | ICD-10-CM | POA: Diagnosis not present

## 2016-05-04 DIAGNOSIS — F331 Major depressive disorder, recurrent, moderate: Secondary | ICD-10-CM | POA: Diagnosis not present

## 2016-05-12 DIAGNOSIS — F4310 Post-traumatic stress disorder, unspecified: Secondary | ICD-10-CM | POA: Diagnosis not present

## 2016-05-12 DIAGNOSIS — F411 Generalized anxiety disorder: Secondary | ICD-10-CM | POA: Diagnosis not present

## 2016-05-12 DIAGNOSIS — F331 Major depressive disorder, recurrent, moderate: Secondary | ICD-10-CM | POA: Diagnosis not present

## 2016-05-17 DIAGNOSIS — Z9689 Presence of other specified functional implants: Secondary | ICD-10-CM | POA: Diagnosis not present

## 2016-05-17 DIAGNOSIS — G894 Chronic pain syndrome: Secondary | ICD-10-CM | POA: Diagnosis not present

## 2016-05-17 DIAGNOSIS — I7381 Erythromelalgia: Secondary | ICD-10-CM | POA: Diagnosis not present

## 2016-05-19 DIAGNOSIS — F411 Generalized anxiety disorder: Secondary | ICD-10-CM | POA: Diagnosis not present

## 2016-05-19 DIAGNOSIS — F4310 Post-traumatic stress disorder, unspecified: Secondary | ICD-10-CM | POA: Diagnosis not present

## 2016-05-19 DIAGNOSIS — F331 Major depressive disorder, recurrent, moderate: Secondary | ICD-10-CM | POA: Diagnosis not present

## 2016-05-24 DIAGNOSIS — F4310 Post-traumatic stress disorder, unspecified: Secondary | ICD-10-CM | POA: Diagnosis not present

## 2016-05-24 DIAGNOSIS — F411 Generalized anxiety disorder: Secondary | ICD-10-CM | POA: Diagnosis not present

## 2016-05-24 DIAGNOSIS — F331 Major depressive disorder, recurrent, moderate: Secondary | ICD-10-CM | POA: Diagnosis not present

## 2016-05-31 DIAGNOSIS — F411 Generalized anxiety disorder: Secondary | ICD-10-CM | POA: Diagnosis not present

## 2016-05-31 DIAGNOSIS — F331 Major depressive disorder, recurrent, moderate: Secondary | ICD-10-CM | POA: Diagnosis not present

## 2016-05-31 DIAGNOSIS — F4310 Post-traumatic stress disorder, unspecified: Secondary | ICD-10-CM | POA: Diagnosis not present

## 2016-06-09 DIAGNOSIS — F4310 Post-traumatic stress disorder, unspecified: Secondary | ICD-10-CM | POA: Diagnosis not present

## 2016-06-09 DIAGNOSIS — F331 Major depressive disorder, recurrent, moderate: Secondary | ICD-10-CM | POA: Diagnosis not present

## 2016-06-09 DIAGNOSIS — F411 Generalized anxiety disorder: Secondary | ICD-10-CM | POA: Diagnosis not present

## 2016-06-15 DIAGNOSIS — F4310 Post-traumatic stress disorder, unspecified: Secondary | ICD-10-CM | POA: Diagnosis not present

## 2016-06-15 DIAGNOSIS — F411 Generalized anxiety disorder: Secondary | ICD-10-CM | POA: Diagnosis not present

## 2016-06-15 DIAGNOSIS — F331 Major depressive disorder, recurrent, moderate: Secondary | ICD-10-CM | POA: Diagnosis not present

## 2016-06-21 DIAGNOSIS — Z23 Encounter for immunization: Secondary | ICD-10-CM | POA: Diagnosis not present

## 2016-06-21 DIAGNOSIS — N183 Chronic kidney disease, stage 3 (moderate): Secondary | ICD-10-CM | POA: Diagnosis not present

## 2016-06-21 DIAGNOSIS — G5603 Carpal tunnel syndrome, bilateral upper limbs: Secondary | ICD-10-CM | POA: Diagnosis not present

## 2016-06-21 DIAGNOSIS — F325 Major depressive disorder, single episode, in full remission: Secondary | ICD-10-CM | POA: Diagnosis not present

## 2016-06-21 DIAGNOSIS — I7381 Erythromelalgia: Secondary | ICD-10-CM | POA: Diagnosis not present

## 2016-06-21 DIAGNOSIS — I129 Hypertensive chronic kidney disease with stage 1 through stage 4 chronic kidney disease, or unspecified chronic kidney disease: Secondary | ICD-10-CM | POA: Diagnosis not present

## 2016-07-05 DIAGNOSIS — G894 Chronic pain syndrome: Secondary | ICD-10-CM | POA: Diagnosis not present

## 2016-07-05 DIAGNOSIS — Z9689 Presence of other specified functional implants: Secondary | ICD-10-CM | POA: Diagnosis not present

## 2016-07-05 DIAGNOSIS — M792 Neuralgia and neuritis, unspecified: Secondary | ICD-10-CM | POA: Diagnosis not present

## 2016-07-05 DIAGNOSIS — I7381 Erythromelalgia: Secondary | ICD-10-CM | POA: Diagnosis not present

## 2016-07-15 DIAGNOSIS — F4310 Post-traumatic stress disorder, unspecified: Secondary | ICD-10-CM | POA: Diagnosis not present

## 2016-07-15 DIAGNOSIS — F411 Generalized anxiety disorder: Secondary | ICD-10-CM | POA: Diagnosis not present

## 2016-07-15 DIAGNOSIS — F331 Major depressive disorder, recurrent, moderate: Secondary | ICD-10-CM | POA: Diagnosis not present

## 2016-07-27 DIAGNOSIS — F4310 Post-traumatic stress disorder, unspecified: Secondary | ICD-10-CM | POA: Diagnosis not present

## 2016-07-27 DIAGNOSIS — F331 Major depressive disorder, recurrent, moderate: Secondary | ICD-10-CM | POA: Diagnosis not present

## 2016-07-27 DIAGNOSIS — F411 Generalized anxiety disorder: Secondary | ICD-10-CM | POA: Diagnosis not present

## 2016-07-28 DIAGNOSIS — D2271 Melanocytic nevi of right lower limb, including hip: Secondary | ICD-10-CM | POA: Diagnosis not present

## 2016-07-28 DIAGNOSIS — Z85828 Personal history of other malignant neoplasm of skin: Secondary | ICD-10-CM | POA: Diagnosis not present

## 2016-07-28 DIAGNOSIS — D225 Melanocytic nevi of trunk: Secondary | ICD-10-CM | POA: Diagnosis not present

## 2016-07-28 DIAGNOSIS — D692 Other nonthrombocytopenic purpura: Secondary | ICD-10-CM | POA: Diagnosis not present

## 2016-07-28 DIAGNOSIS — L821 Other seborrheic keratosis: Secondary | ICD-10-CM | POA: Diagnosis not present

## 2016-07-28 DIAGNOSIS — D1801 Hemangioma of skin and subcutaneous tissue: Secondary | ICD-10-CM | POA: Diagnosis not present

## 2016-07-29 DIAGNOSIS — J301 Allergic rhinitis due to pollen: Secondary | ICD-10-CM | POA: Diagnosis not present

## 2016-07-29 DIAGNOSIS — R0609 Other forms of dyspnea: Secondary | ICD-10-CM | POA: Diagnosis not present

## 2016-07-29 DIAGNOSIS — Z79899 Other long term (current) drug therapy: Secondary | ICD-10-CM | POA: Diagnosis not present

## 2016-07-29 DIAGNOSIS — N183 Chronic kidney disease, stage 3 (moderate): Secondary | ICD-10-CM | POA: Diagnosis not present

## 2016-07-29 DIAGNOSIS — I129 Hypertensive chronic kidney disease with stage 1 through stage 4 chronic kidney disease, or unspecified chronic kidney disease: Secondary | ICD-10-CM | POA: Diagnosis not present

## 2016-07-29 DIAGNOSIS — D539 Nutritional anemia, unspecified: Secondary | ICD-10-CM | POA: Diagnosis not present

## 2016-07-29 DIAGNOSIS — R600 Localized edema: Secondary | ICD-10-CM | POA: Diagnosis not present

## 2016-08-08 DIAGNOSIS — E611 Iron deficiency: Secondary | ICD-10-CM | POA: Diagnosis not present

## 2016-08-08 DIAGNOSIS — I129 Hypertensive chronic kidney disease with stage 1 through stage 4 chronic kidney disease, or unspecified chronic kidney disease: Secondary | ICD-10-CM | POA: Diagnosis not present

## 2016-08-08 DIAGNOSIS — N183 Chronic kidney disease, stage 3 (moderate): Secondary | ICD-10-CM | POA: Diagnosis not present

## 2016-08-12 DIAGNOSIS — F331 Major depressive disorder, recurrent, moderate: Secondary | ICD-10-CM | POA: Diagnosis not present

## 2016-08-12 DIAGNOSIS — F411 Generalized anxiety disorder: Secondary | ICD-10-CM | POA: Diagnosis not present

## 2016-08-12 DIAGNOSIS — F4310 Post-traumatic stress disorder, unspecified: Secondary | ICD-10-CM | POA: Diagnosis not present

## 2016-08-16 DIAGNOSIS — F4310 Post-traumatic stress disorder, unspecified: Secondary | ICD-10-CM | POA: Diagnosis not present

## 2016-08-16 DIAGNOSIS — F331 Major depressive disorder, recurrent, moderate: Secondary | ICD-10-CM | POA: Diagnosis not present

## 2016-08-16 DIAGNOSIS — F411 Generalized anxiety disorder: Secondary | ICD-10-CM | POA: Diagnosis not present

## 2016-08-17 DIAGNOSIS — G894 Chronic pain syndrome: Secondary | ICD-10-CM | POA: Diagnosis not present

## 2016-08-17 DIAGNOSIS — Z9689 Presence of other specified functional implants: Secondary | ICD-10-CM | POA: Diagnosis not present

## 2016-08-17 DIAGNOSIS — M792 Neuralgia and neuritis, unspecified: Secondary | ICD-10-CM | POA: Diagnosis not present

## 2016-08-17 DIAGNOSIS — I7381 Erythromelalgia: Secondary | ICD-10-CM | POA: Diagnosis not present

## 2016-08-23 DIAGNOSIS — K5903 Drug induced constipation: Secondary | ICD-10-CM | POA: Diagnosis not present

## 2016-08-23 DIAGNOSIS — D509 Iron deficiency anemia, unspecified: Secondary | ICD-10-CM | POA: Diagnosis not present

## 2016-08-23 DIAGNOSIS — T402X5A Adverse effect of other opioids, initial encounter: Secondary | ICD-10-CM | POA: Diagnosis not present

## 2016-08-24 DIAGNOSIS — R600 Localized edema: Secondary | ICD-10-CM | POA: Diagnosis not present

## 2016-08-24 DIAGNOSIS — I129 Hypertensive chronic kidney disease with stage 1 through stage 4 chronic kidney disease, or unspecified chronic kidney disease: Secondary | ICD-10-CM | POA: Diagnosis not present

## 2016-08-24 DIAGNOSIS — Z79899 Other long term (current) drug therapy: Secondary | ICD-10-CM | POA: Diagnosis not present

## 2016-08-24 DIAGNOSIS — N183 Chronic kidney disease, stage 3 (moderate): Secondary | ICD-10-CM | POA: Diagnosis not present

## 2016-09-01 DIAGNOSIS — F331 Major depressive disorder, recurrent, moderate: Secondary | ICD-10-CM | POA: Diagnosis not present

## 2016-09-01 DIAGNOSIS — F411 Generalized anxiety disorder: Secondary | ICD-10-CM | POA: Diagnosis not present

## 2016-09-01 DIAGNOSIS — F4310 Post-traumatic stress disorder, unspecified: Secondary | ICD-10-CM | POA: Diagnosis not present

## 2016-09-08 DIAGNOSIS — F411 Generalized anxiety disorder: Secondary | ICD-10-CM | POA: Diagnosis not present

## 2016-09-08 DIAGNOSIS — F331 Major depressive disorder, recurrent, moderate: Secondary | ICD-10-CM | POA: Diagnosis not present

## 2016-09-08 DIAGNOSIS — F4310 Post-traumatic stress disorder, unspecified: Secondary | ICD-10-CM | POA: Diagnosis not present

## 2016-09-22 DIAGNOSIS — F4310 Post-traumatic stress disorder, unspecified: Secondary | ICD-10-CM | POA: Diagnosis not present

## 2016-09-22 DIAGNOSIS — F331 Major depressive disorder, recurrent, moderate: Secondary | ICD-10-CM | POA: Diagnosis not present

## 2016-09-22 DIAGNOSIS — F411 Generalized anxiety disorder: Secondary | ICD-10-CM | POA: Diagnosis not present

## 2016-10-03 DIAGNOSIS — M47897 Other spondylosis, lumbosacral region: Secondary | ICD-10-CM | POA: Diagnosis not present

## 2016-10-03 DIAGNOSIS — G894 Chronic pain syndrome: Secondary | ICD-10-CM | POA: Diagnosis not present

## 2016-10-03 DIAGNOSIS — I7381 Erythromelalgia: Secondary | ICD-10-CM | POA: Diagnosis not present

## 2016-10-03 DIAGNOSIS — M47817 Spondylosis without myelopathy or radiculopathy, lumbosacral region: Secondary | ICD-10-CM | POA: Diagnosis not present

## 2016-10-03 DIAGNOSIS — M545 Low back pain: Secondary | ICD-10-CM | POA: Diagnosis not present

## 2016-10-03 DIAGNOSIS — M5137 Other intervertebral disc degeneration, lumbosacral region: Secondary | ICD-10-CM | POA: Diagnosis not present

## 2016-10-03 DIAGNOSIS — G8929 Other chronic pain: Secondary | ICD-10-CM | POA: Diagnosis not present

## 2016-10-03 DIAGNOSIS — Z79899 Other long term (current) drug therapy: Secondary | ICD-10-CM | POA: Diagnosis not present

## 2016-10-03 DIAGNOSIS — S39012A Strain of muscle, fascia and tendon of lower back, initial encounter: Secondary | ICD-10-CM | POA: Diagnosis not present

## 2016-10-03 DIAGNOSIS — Z5181 Encounter for therapeutic drug level monitoring: Secondary | ICD-10-CM | POA: Diagnosis not present

## 2016-10-03 DIAGNOSIS — Z9689 Presence of other specified functional implants: Secondary | ICD-10-CM | POA: Diagnosis not present

## 2016-10-03 DIAGNOSIS — M5442 Lumbago with sciatica, left side: Secondary | ICD-10-CM | POA: Diagnosis not present

## 2016-10-03 DIAGNOSIS — M792 Neuralgia and neuritis, unspecified: Secondary | ICD-10-CM | POA: Diagnosis not present

## 2016-10-05 DIAGNOSIS — F331 Major depressive disorder, recurrent, moderate: Secondary | ICD-10-CM | POA: Diagnosis not present

## 2016-10-05 DIAGNOSIS — F4310 Post-traumatic stress disorder, unspecified: Secondary | ICD-10-CM | POA: Diagnosis not present

## 2016-10-05 DIAGNOSIS — F411 Generalized anxiety disorder: Secondary | ICD-10-CM | POA: Diagnosis not present

## 2016-10-10 DIAGNOSIS — K640 First degree hemorrhoids: Secondary | ICD-10-CM | POA: Diagnosis not present

## 2016-10-10 DIAGNOSIS — K5730 Diverticulosis of large intestine without perforation or abscess without bleeding: Secondary | ICD-10-CM | POA: Diagnosis not present

## 2016-10-10 DIAGNOSIS — K2930 Chronic superficial gastritis without bleeding: Secondary | ICD-10-CM | POA: Diagnosis not present

## 2016-10-10 DIAGNOSIS — K210 Gastro-esophageal reflux disease with esophagitis: Secondary | ICD-10-CM | POA: Diagnosis not present

## 2016-10-10 DIAGNOSIS — D509 Iron deficiency anemia, unspecified: Secondary | ICD-10-CM | POA: Diagnosis not present

## 2016-10-10 DIAGNOSIS — R1310 Dysphagia, unspecified: Secondary | ICD-10-CM | POA: Diagnosis not present

## 2016-10-12 DIAGNOSIS — D509 Iron deficiency anemia, unspecified: Secondary | ICD-10-CM | POA: Diagnosis not present

## 2016-10-12 DIAGNOSIS — K210 Gastro-esophageal reflux disease with esophagitis: Secondary | ICD-10-CM | POA: Diagnosis not present

## 2016-10-14 DIAGNOSIS — F331 Major depressive disorder, recurrent, moderate: Secondary | ICD-10-CM | POA: Diagnosis not present

## 2016-10-14 DIAGNOSIS — F4310 Post-traumatic stress disorder, unspecified: Secondary | ICD-10-CM | POA: Diagnosis not present

## 2016-10-14 DIAGNOSIS — F411 Generalized anxiety disorder: Secondary | ICD-10-CM | POA: Diagnosis not present

## 2016-10-19 DIAGNOSIS — F4310 Post-traumatic stress disorder, unspecified: Secondary | ICD-10-CM | POA: Diagnosis not present

## 2016-10-19 DIAGNOSIS — F331 Major depressive disorder, recurrent, moderate: Secondary | ICD-10-CM | POA: Diagnosis not present

## 2016-10-19 DIAGNOSIS — F411 Generalized anxiety disorder: Secondary | ICD-10-CM | POA: Diagnosis not present

## 2016-10-21 DIAGNOSIS — I7381 Erythromelalgia: Secondary | ICD-10-CM | POA: Diagnosis not present

## 2016-10-21 DIAGNOSIS — I129 Hypertensive chronic kidney disease with stage 1 through stage 4 chronic kidney disease, or unspecified chronic kidney disease: Secondary | ICD-10-CM | POA: Diagnosis not present

## 2016-10-21 DIAGNOSIS — N183 Chronic kidney disease, stage 3 (moderate): Secondary | ICD-10-CM | POA: Diagnosis not present

## 2016-10-21 DIAGNOSIS — F325 Major depressive disorder, single episode, in full remission: Secondary | ICD-10-CM | POA: Diagnosis not present

## 2016-10-21 DIAGNOSIS — Z79899 Other long term (current) drug therapy: Secondary | ICD-10-CM | POA: Diagnosis not present

## 2016-10-21 DIAGNOSIS — D509 Iron deficiency anemia, unspecified: Secondary | ICD-10-CM | POA: Diagnosis not present

## 2016-10-26 DIAGNOSIS — F331 Major depressive disorder, recurrent, moderate: Secondary | ICD-10-CM | POA: Diagnosis not present

## 2016-10-26 DIAGNOSIS — F4310 Post-traumatic stress disorder, unspecified: Secondary | ICD-10-CM | POA: Diagnosis not present

## 2016-10-26 DIAGNOSIS — F411 Generalized anxiety disorder: Secondary | ICD-10-CM | POA: Diagnosis not present

## 2016-11-01 DIAGNOSIS — K5903 Drug induced constipation: Secondary | ICD-10-CM | POA: Diagnosis not present

## 2016-11-01 DIAGNOSIS — D500 Iron deficiency anemia secondary to blood loss (chronic): Secondary | ICD-10-CM | POA: Diagnosis not present

## 2016-11-01 DIAGNOSIS — T402X5A Adverse effect of other opioids, initial encounter: Secondary | ICD-10-CM | POA: Diagnosis not present

## 2016-11-02 DIAGNOSIS — F4310 Post-traumatic stress disorder, unspecified: Secondary | ICD-10-CM | POA: Diagnosis not present

## 2016-11-02 DIAGNOSIS — F331 Major depressive disorder, recurrent, moderate: Secondary | ICD-10-CM | POA: Diagnosis not present

## 2016-11-02 DIAGNOSIS — F411 Generalized anxiety disorder: Secondary | ICD-10-CM | POA: Diagnosis not present

## 2016-11-09 DIAGNOSIS — F4310 Post-traumatic stress disorder, unspecified: Secondary | ICD-10-CM | POA: Diagnosis not present

## 2016-11-09 DIAGNOSIS — F331 Major depressive disorder, recurrent, moderate: Secondary | ICD-10-CM | POA: Diagnosis not present

## 2016-11-09 DIAGNOSIS — F411 Generalized anxiety disorder: Secondary | ICD-10-CM | POA: Diagnosis not present

## 2016-11-15 DIAGNOSIS — Z9689 Presence of other specified functional implants: Secondary | ICD-10-CM | POA: Diagnosis not present

## 2016-11-15 DIAGNOSIS — G894 Chronic pain syndrome: Secondary | ICD-10-CM | POA: Diagnosis not present

## 2016-11-15 DIAGNOSIS — M792 Neuralgia and neuritis, unspecified: Secondary | ICD-10-CM | POA: Diagnosis not present

## 2016-11-15 DIAGNOSIS — I7381 Erythromelalgia: Secondary | ICD-10-CM | POA: Diagnosis not present

## 2016-11-15 DIAGNOSIS — M79671 Pain in right foot: Secondary | ICD-10-CM | POA: Diagnosis not present

## 2016-11-17 DIAGNOSIS — F411 Generalized anxiety disorder: Secondary | ICD-10-CM | POA: Diagnosis not present

## 2016-11-17 DIAGNOSIS — F4310 Post-traumatic stress disorder, unspecified: Secondary | ICD-10-CM | POA: Diagnosis not present

## 2016-11-17 DIAGNOSIS — F331 Major depressive disorder, recurrent, moderate: Secondary | ICD-10-CM | POA: Diagnosis not present

## 2016-11-25 DIAGNOSIS — F331 Major depressive disorder, recurrent, moderate: Secondary | ICD-10-CM | POA: Diagnosis not present

## 2016-11-25 DIAGNOSIS — F4310 Post-traumatic stress disorder, unspecified: Secondary | ICD-10-CM | POA: Diagnosis not present

## 2016-11-25 DIAGNOSIS — F411 Generalized anxiety disorder: Secondary | ICD-10-CM | POA: Diagnosis not present

## 2016-12-06 DIAGNOSIS — F411 Generalized anxiety disorder: Secondary | ICD-10-CM | POA: Diagnosis not present

## 2016-12-06 DIAGNOSIS — F4310 Post-traumatic stress disorder, unspecified: Secondary | ICD-10-CM | POA: Diagnosis not present

## 2016-12-06 DIAGNOSIS — F331 Major depressive disorder, recurrent, moderate: Secondary | ICD-10-CM | POA: Diagnosis not present

## 2016-12-14 DIAGNOSIS — F331 Major depressive disorder, recurrent, moderate: Secondary | ICD-10-CM | POA: Diagnosis not present

## 2016-12-14 DIAGNOSIS — F4310 Post-traumatic stress disorder, unspecified: Secondary | ICD-10-CM | POA: Diagnosis not present

## 2016-12-14 DIAGNOSIS — F411 Generalized anxiety disorder: Secondary | ICD-10-CM | POA: Diagnosis not present

## 2016-12-21 DIAGNOSIS — F331 Major depressive disorder, recurrent, moderate: Secondary | ICD-10-CM | POA: Diagnosis not present

## 2016-12-21 DIAGNOSIS — F411 Generalized anxiety disorder: Secondary | ICD-10-CM | POA: Diagnosis not present

## 2016-12-21 DIAGNOSIS — F4310 Post-traumatic stress disorder, unspecified: Secondary | ICD-10-CM | POA: Diagnosis not present

## 2016-12-28 DIAGNOSIS — M79672 Pain in left foot: Secondary | ICD-10-CM | POA: Diagnosis not present

## 2016-12-28 DIAGNOSIS — I7381 Erythromelalgia: Secondary | ICD-10-CM | POA: Diagnosis not present

## 2016-12-28 DIAGNOSIS — Z9689 Presence of other specified functional implants: Secondary | ICD-10-CM | POA: Diagnosis not present

## 2016-12-28 DIAGNOSIS — G894 Chronic pain syndrome: Secondary | ICD-10-CM | POA: Diagnosis not present

## 2016-12-28 DIAGNOSIS — M79671 Pain in right foot: Secondary | ICD-10-CM | POA: Diagnosis not present

## 2016-12-28 DIAGNOSIS — M792 Neuralgia and neuritis, unspecified: Secondary | ICD-10-CM | POA: Diagnosis not present

## 2017-01-16 DIAGNOSIS — K5909 Other constipation: Secondary | ICD-10-CM | POA: Diagnosis not present

## 2017-01-16 DIAGNOSIS — I129 Hypertensive chronic kidney disease with stage 1 through stage 4 chronic kidney disease, or unspecified chronic kidney disease: Secondary | ICD-10-CM | POA: Diagnosis not present

## 2017-01-16 DIAGNOSIS — M549 Dorsalgia, unspecified: Secondary | ICD-10-CM | POA: Diagnosis not present

## 2017-01-16 DIAGNOSIS — D500 Iron deficiency anemia secondary to blood loss (chronic): Secondary | ICD-10-CM | POA: Diagnosis not present

## 2017-01-16 DIAGNOSIS — F325 Major depressive disorder, single episode, in full remission: Secondary | ICD-10-CM | POA: Diagnosis not present

## 2017-01-16 DIAGNOSIS — Z23 Encounter for immunization: Secondary | ICD-10-CM | POA: Diagnosis not present

## 2017-01-16 DIAGNOSIS — I7381 Erythromelalgia: Secondary | ICD-10-CM | POA: Diagnosis not present

## 2017-01-16 DIAGNOSIS — G4700 Insomnia, unspecified: Secondary | ICD-10-CM | POA: Diagnosis not present

## 2017-01-16 DIAGNOSIS — J301 Allergic rhinitis due to pollen: Secondary | ICD-10-CM | POA: Diagnosis not present

## 2017-01-16 DIAGNOSIS — N183 Chronic kidney disease, stage 3 (moderate): Secondary | ICD-10-CM | POA: Diagnosis not present

## 2017-01-16 DIAGNOSIS — K219 Gastro-esophageal reflux disease without esophagitis: Secondary | ICD-10-CM | POA: Diagnosis not present

## 2017-01-17 DIAGNOSIS — N183 Chronic kidney disease, stage 3 (moderate): Secondary | ICD-10-CM | POA: Diagnosis not present

## 2017-01-17 DIAGNOSIS — D500 Iron deficiency anemia secondary to blood loss (chronic): Secondary | ICD-10-CM | POA: Diagnosis not present

## 2017-01-17 DIAGNOSIS — I129 Hypertensive chronic kidney disease with stage 1 through stage 4 chronic kidney disease, or unspecified chronic kidney disease: Secondary | ICD-10-CM | POA: Diagnosis not present

## 2017-01-17 DIAGNOSIS — D509 Iron deficiency anemia, unspecified: Secondary | ICD-10-CM | POA: Diagnosis not present

## 2017-01-17 DIAGNOSIS — F325 Major depressive disorder, single episode, in full remission: Secondary | ICD-10-CM | POA: Diagnosis not present

## 2017-01-30 DIAGNOSIS — N183 Chronic kidney disease, stage 3 (moderate): Secondary | ICD-10-CM | POA: Diagnosis not present

## 2017-01-30 DIAGNOSIS — I129 Hypertensive chronic kidney disease with stage 1 through stage 4 chronic kidney disease, or unspecified chronic kidney disease: Secondary | ICD-10-CM | POA: Diagnosis not present

## 2017-01-30 DIAGNOSIS — F325 Major depressive disorder, single episode, in full remission: Secondary | ICD-10-CM | POA: Diagnosis not present

## 2017-01-30 DIAGNOSIS — D500 Iron deficiency anemia secondary to blood loss (chronic): Secondary | ICD-10-CM | POA: Diagnosis not present

## 2017-02-07 DIAGNOSIS — G894 Chronic pain syndrome: Secondary | ICD-10-CM | POA: Diagnosis not present

## 2017-02-07 DIAGNOSIS — Z9689 Presence of other specified functional implants: Secondary | ICD-10-CM | POA: Diagnosis not present

## 2017-02-07 DIAGNOSIS — M792 Neuralgia and neuritis, unspecified: Secondary | ICD-10-CM | POA: Diagnosis not present

## 2017-02-07 DIAGNOSIS — Z791 Long term (current) use of non-steroidal anti-inflammatories (NSAID): Secondary | ICD-10-CM | POA: Diagnosis not present

## 2017-03-01 DIAGNOSIS — Z9689 Presence of other specified functional implants: Secondary | ICD-10-CM | POA: Diagnosis not present

## 2017-03-01 DIAGNOSIS — M79671 Pain in right foot: Secondary | ICD-10-CM | POA: Diagnosis not present

## 2017-03-01 DIAGNOSIS — I7381 Erythromelalgia: Secondary | ICD-10-CM | POA: Diagnosis not present

## 2017-03-01 DIAGNOSIS — G894 Chronic pain syndrome: Secondary | ICD-10-CM | POA: Diagnosis not present

## 2017-03-01 DIAGNOSIS — M792 Neuralgia and neuritis, unspecified: Secondary | ICD-10-CM | POA: Diagnosis not present

## 2017-04-11 DIAGNOSIS — M79671 Pain in right foot: Secondary | ICD-10-CM | POA: Diagnosis not present

## 2017-04-11 DIAGNOSIS — M5442 Lumbago with sciatica, left side: Secondary | ICD-10-CM | POA: Diagnosis not present

## 2017-04-11 DIAGNOSIS — G8929 Other chronic pain: Secondary | ICD-10-CM | POA: Diagnosis not present

## 2017-04-11 DIAGNOSIS — I7381 Erythromelalgia: Secondary | ICD-10-CM | POA: Diagnosis not present

## 2017-04-11 DIAGNOSIS — G894 Chronic pain syndrome: Secondary | ICD-10-CM | POA: Diagnosis not present

## 2017-04-17 DIAGNOSIS — N183 Chronic kidney disease, stage 3 (moderate): Secondary | ICD-10-CM | POA: Diagnosis not present

## 2017-04-17 DIAGNOSIS — Z1389 Encounter for screening for other disorder: Secondary | ICD-10-CM | POA: Diagnosis not present

## 2017-04-17 DIAGNOSIS — Z23 Encounter for immunization: Secondary | ICD-10-CM | POA: Diagnosis not present

## 2017-04-17 DIAGNOSIS — Z0000 Encounter for general adult medical examination without abnormal findings: Secondary | ICD-10-CM | POA: Diagnosis not present

## 2017-04-17 DIAGNOSIS — Z79899 Other long term (current) drug therapy: Secondary | ICD-10-CM | POA: Diagnosis not present

## 2017-04-17 DIAGNOSIS — I7381 Erythromelalgia: Secondary | ICD-10-CM | POA: Diagnosis not present

## 2017-04-17 DIAGNOSIS — M8588 Other specified disorders of bone density and structure, other site: Secondary | ICD-10-CM | POA: Diagnosis not present

## 2017-04-17 DIAGNOSIS — I129 Hypertensive chronic kidney disease with stage 1 through stage 4 chronic kidney disease, or unspecified chronic kidney disease: Secondary | ICD-10-CM | POA: Diagnosis not present

## 2017-04-17 DIAGNOSIS — F325 Major depressive disorder, single episode, in full remission: Secondary | ICD-10-CM | POA: Diagnosis not present

## 2017-05-15 ENCOUNTER — Other Ambulatory Visit: Payer: Self-pay | Admitting: Geriatric Medicine

## 2017-05-15 DIAGNOSIS — Z1231 Encounter for screening mammogram for malignant neoplasm of breast: Secondary | ICD-10-CM

## 2017-05-23 DIAGNOSIS — G8929 Other chronic pain: Secondary | ICD-10-CM | POA: Diagnosis not present

## 2017-05-23 DIAGNOSIS — Z5181 Encounter for therapeutic drug level monitoring: Secondary | ICD-10-CM | POA: Diagnosis not present

## 2017-05-23 DIAGNOSIS — I7381 Erythromelalgia: Secondary | ICD-10-CM | POA: Diagnosis not present

## 2017-05-23 DIAGNOSIS — Z79899 Other long term (current) drug therapy: Secondary | ICD-10-CM | POA: Diagnosis not present

## 2017-05-23 DIAGNOSIS — G894 Chronic pain syndrome: Secondary | ICD-10-CM | POA: Diagnosis not present

## 2017-05-23 DIAGNOSIS — M5442 Lumbago with sciatica, left side: Secondary | ICD-10-CM | POA: Diagnosis not present

## 2017-05-23 DIAGNOSIS — Z9689 Presence of other specified functional implants: Secondary | ICD-10-CM | POA: Diagnosis not present

## 2017-06-06 ENCOUNTER — Other Ambulatory Visit: Payer: Self-pay | Admitting: Geriatric Medicine

## 2017-06-06 ENCOUNTER — Ambulatory Visit
Admission: RE | Admit: 2017-06-06 | Discharge: 2017-06-06 | Disposition: A | Discharge status: Home / Self Care | Payer: Medicare Other | Source: Ambulatory Visit | Attending: Geriatric Medicine | Admitting: Geriatric Medicine

## 2017-06-06 DIAGNOSIS — Z1231 Encounter for screening mammogram for malignant neoplasm of breast: Secondary | ICD-10-CM

## 2017-06-08 DIAGNOSIS — M8588 Other specified disorders of bone density and structure, other site: Secondary | ICD-10-CM | POA: Diagnosis not present

## 2017-06-13 DIAGNOSIS — I7381 Erythromelalgia: Secondary | ICD-10-CM | POA: Diagnosis not present

## 2017-06-13 DIAGNOSIS — M79671 Pain in right foot: Secondary | ICD-10-CM | POA: Diagnosis not present

## 2017-06-13 DIAGNOSIS — M5442 Lumbago with sciatica, left side: Secondary | ICD-10-CM | POA: Diagnosis not present

## 2017-06-13 DIAGNOSIS — M792 Neuralgia and neuritis, unspecified: Secondary | ICD-10-CM | POA: Diagnosis not present

## 2017-06-30 DIAGNOSIS — F411 Generalized anxiety disorder: Secondary | ICD-10-CM | POA: Diagnosis not present

## 2017-06-30 DIAGNOSIS — F331 Major depressive disorder, recurrent, moderate: Secondary | ICD-10-CM | POA: Diagnosis not present

## 2017-06-30 DIAGNOSIS — F4310 Post-traumatic stress disorder, unspecified: Secondary | ICD-10-CM | POA: Diagnosis not present

## 2017-07-07 DIAGNOSIS — F411 Generalized anxiety disorder: Secondary | ICD-10-CM | POA: Diagnosis not present

## 2017-07-07 DIAGNOSIS — F4310 Post-traumatic stress disorder, unspecified: Secondary | ICD-10-CM | POA: Diagnosis not present

## 2017-07-07 DIAGNOSIS — F331 Major depressive disorder, recurrent, moderate: Secondary | ICD-10-CM | POA: Diagnosis not present

## 2017-07-11 DIAGNOSIS — M5442 Lumbago with sciatica, left side: Secondary | ICD-10-CM | POA: Diagnosis not present

## 2017-07-11 DIAGNOSIS — G8929 Other chronic pain: Secondary | ICD-10-CM | POA: Diagnosis not present

## 2017-07-11 DIAGNOSIS — M792 Neuralgia and neuritis, unspecified: Secondary | ICD-10-CM | POA: Diagnosis not present

## 2017-07-11 DIAGNOSIS — I7381 Erythromelalgia: Secondary | ICD-10-CM | POA: Diagnosis not present

## 2017-07-11 DIAGNOSIS — M79671 Pain in right foot: Secondary | ICD-10-CM | POA: Diagnosis not present

## 2017-07-14 DIAGNOSIS — F4310 Post-traumatic stress disorder, unspecified: Secondary | ICD-10-CM | POA: Diagnosis not present

## 2017-07-14 DIAGNOSIS — F411 Generalized anxiety disorder: Secondary | ICD-10-CM | POA: Diagnosis not present

## 2017-07-14 DIAGNOSIS — F331 Major depressive disorder, recurrent, moderate: Secondary | ICD-10-CM | POA: Diagnosis not present

## 2017-07-18 DIAGNOSIS — Z9689 Presence of other specified functional implants: Secondary | ICD-10-CM | POA: Diagnosis not present

## 2017-07-18 DIAGNOSIS — M79672 Pain in left foot: Secondary | ICD-10-CM | POA: Diagnosis not present

## 2017-07-18 DIAGNOSIS — I7381 Erythromelalgia: Secondary | ICD-10-CM | POA: Diagnosis not present

## 2017-07-18 DIAGNOSIS — M79671 Pain in right foot: Secondary | ICD-10-CM | POA: Diagnosis not present

## 2017-07-18 DIAGNOSIS — I10 Essential (primary) hypertension: Secondary | ICD-10-CM | POA: Diagnosis not present

## 2017-07-20 DIAGNOSIS — F4310 Post-traumatic stress disorder, unspecified: Secondary | ICD-10-CM | POA: Diagnosis not present

## 2017-07-20 DIAGNOSIS — F331 Major depressive disorder, recurrent, moderate: Secondary | ICD-10-CM | POA: Diagnosis not present

## 2017-07-20 DIAGNOSIS — F411 Generalized anxiety disorder: Secondary | ICD-10-CM | POA: Diagnosis not present

## 2017-07-28 DIAGNOSIS — F4310 Post-traumatic stress disorder, unspecified: Secondary | ICD-10-CM | POA: Diagnosis not present

## 2017-07-28 DIAGNOSIS — F331 Major depressive disorder, recurrent, moderate: Secondary | ICD-10-CM | POA: Diagnosis not present

## 2017-07-28 DIAGNOSIS — F411 Generalized anxiety disorder: Secondary | ICD-10-CM | POA: Diagnosis not present

## 2017-08-01 DIAGNOSIS — L812 Freckles: Secondary | ICD-10-CM | POA: Diagnosis not present

## 2017-08-01 DIAGNOSIS — D225 Melanocytic nevi of trunk: Secondary | ICD-10-CM | POA: Diagnosis not present

## 2017-08-01 DIAGNOSIS — L309 Dermatitis, unspecified: Secondary | ICD-10-CM | POA: Diagnosis not present

## 2017-08-01 DIAGNOSIS — D692 Other nonthrombocytopenic purpura: Secondary | ICD-10-CM | POA: Diagnosis not present

## 2017-08-01 DIAGNOSIS — L570 Actinic keratosis: Secondary | ICD-10-CM | POA: Diagnosis not present

## 2017-08-01 DIAGNOSIS — L821 Other seborrheic keratosis: Secondary | ICD-10-CM | POA: Diagnosis not present

## 2017-08-02 DIAGNOSIS — F331 Major depressive disorder, recurrent, moderate: Secondary | ICD-10-CM | POA: Diagnosis not present

## 2017-08-02 DIAGNOSIS — G8929 Other chronic pain: Secondary | ICD-10-CM | POA: Diagnosis not present

## 2017-08-02 DIAGNOSIS — F4310 Post-traumatic stress disorder, unspecified: Secondary | ICD-10-CM | POA: Diagnosis not present

## 2017-08-02 DIAGNOSIS — F411 Generalized anxiety disorder: Secondary | ICD-10-CM | POA: Diagnosis not present

## 2017-08-10 DIAGNOSIS — F4310 Post-traumatic stress disorder, unspecified: Secondary | ICD-10-CM | POA: Diagnosis not present

## 2017-08-10 DIAGNOSIS — F411 Generalized anxiety disorder: Secondary | ICD-10-CM | POA: Diagnosis not present

## 2017-08-10 DIAGNOSIS — F331 Major depressive disorder, recurrent, moderate: Secondary | ICD-10-CM | POA: Diagnosis not present

## 2017-08-15 DIAGNOSIS — I7381 Erythromelalgia: Secondary | ICD-10-CM | POA: Diagnosis not present

## 2017-08-15 DIAGNOSIS — M792 Neuralgia and neuritis, unspecified: Secondary | ICD-10-CM | POA: Diagnosis not present

## 2017-08-15 DIAGNOSIS — G894 Chronic pain syndrome: Secondary | ICD-10-CM | POA: Diagnosis not present

## 2017-08-15 DIAGNOSIS — Z9689 Presence of other specified functional implants: Secondary | ICD-10-CM | POA: Diagnosis not present

## 2017-08-18 DIAGNOSIS — F331 Major depressive disorder, recurrent, moderate: Secondary | ICD-10-CM | POA: Diagnosis not present

## 2017-08-18 DIAGNOSIS — F4310 Post-traumatic stress disorder, unspecified: Secondary | ICD-10-CM | POA: Diagnosis not present

## 2017-08-18 DIAGNOSIS — F411 Generalized anxiety disorder: Secondary | ICD-10-CM | POA: Diagnosis not present

## 2017-09-01 DIAGNOSIS — F4310 Post-traumatic stress disorder, unspecified: Secondary | ICD-10-CM | POA: Diagnosis not present

## 2017-09-01 DIAGNOSIS — F411 Generalized anxiety disorder: Secondary | ICD-10-CM | POA: Diagnosis not present

## 2017-09-01 DIAGNOSIS — F331 Major depressive disorder, recurrent, moderate: Secondary | ICD-10-CM | POA: Diagnosis not present

## 2017-09-08 DIAGNOSIS — F4310 Post-traumatic stress disorder, unspecified: Secondary | ICD-10-CM | POA: Diagnosis not present

## 2017-09-08 DIAGNOSIS — F411 Generalized anxiety disorder: Secondary | ICD-10-CM | POA: Diagnosis not present

## 2017-09-08 DIAGNOSIS — F331 Major depressive disorder, recurrent, moderate: Secondary | ICD-10-CM | POA: Diagnosis not present

## 2017-09-15 DIAGNOSIS — F4310 Post-traumatic stress disorder, unspecified: Secondary | ICD-10-CM | POA: Diagnosis not present

## 2017-09-15 DIAGNOSIS — F411 Generalized anxiety disorder: Secondary | ICD-10-CM | POA: Diagnosis not present

## 2017-09-15 DIAGNOSIS — F331 Major depressive disorder, recurrent, moderate: Secondary | ICD-10-CM | POA: Diagnosis not present

## 2017-09-22 DIAGNOSIS — F4310 Post-traumatic stress disorder, unspecified: Secondary | ICD-10-CM | POA: Diagnosis not present

## 2017-09-22 DIAGNOSIS — F331 Major depressive disorder, recurrent, moderate: Secondary | ICD-10-CM | POA: Diagnosis not present

## 2017-09-22 DIAGNOSIS — F411 Generalized anxiety disorder: Secondary | ICD-10-CM | POA: Diagnosis not present

## 2017-09-25 DIAGNOSIS — M792 Neuralgia and neuritis, unspecified: Secondary | ICD-10-CM | POA: Diagnosis not present

## 2017-09-25 DIAGNOSIS — M5442 Lumbago with sciatica, left side: Secondary | ICD-10-CM | POA: Diagnosis not present

## 2017-09-25 DIAGNOSIS — G8929 Other chronic pain: Secondary | ICD-10-CM | POA: Diagnosis not present

## 2017-09-25 DIAGNOSIS — Z9689 Presence of other specified functional implants: Secondary | ICD-10-CM | POA: Diagnosis not present

## 2017-10-06 DIAGNOSIS — F4310 Post-traumatic stress disorder, unspecified: Secondary | ICD-10-CM | POA: Diagnosis not present

## 2017-10-06 DIAGNOSIS — F411 Generalized anxiety disorder: Secondary | ICD-10-CM | POA: Diagnosis not present

## 2017-10-06 DIAGNOSIS — F331 Major depressive disorder, recurrent, moderate: Secondary | ICD-10-CM | POA: Diagnosis not present

## 2017-10-20 DIAGNOSIS — F331 Major depressive disorder, recurrent, moderate: Secondary | ICD-10-CM | POA: Diagnosis not present

## 2017-10-20 DIAGNOSIS — F4310 Post-traumatic stress disorder, unspecified: Secondary | ICD-10-CM | POA: Diagnosis not present

## 2017-10-20 DIAGNOSIS — F411 Generalized anxiety disorder: Secondary | ICD-10-CM | POA: Diagnosis not present

## 2017-10-25 DIAGNOSIS — I129 Hypertensive chronic kidney disease with stage 1 through stage 4 chronic kidney disease, or unspecified chronic kidney disease: Secondary | ICD-10-CM | POA: Diagnosis not present

## 2017-10-25 DIAGNOSIS — R002 Palpitations: Secondary | ICD-10-CM | POA: Diagnosis not present

## 2017-10-25 DIAGNOSIS — N183 Chronic kidney disease, stage 3 (moderate): Secondary | ICD-10-CM | POA: Diagnosis not present

## 2017-10-25 DIAGNOSIS — Z79899 Other long term (current) drug therapy: Secondary | ICD-10-CM | POA: Diagnosis not present

## 2017-10-25 DIAGNOSIS — S6991XA Unspecified injury of right wrist, hand and finger(s), initial encounter: Secondary | ICD-10-CM | POA: Diagnosis not present

## 2017-10-25 DIAGNOSIS — B351 Tinea unguium: Secondary | ICD-10-CM | POA: Diagnosis not present

## 2017-11-03 DIAGNOSIS — F411 Generalized anxiety disorder: Secondary | ICD-10-CM | POA: Diagnosis not present

## 2017-11-03 DIAGNOSIS — F331 Major depressive disorder, recurrent, moderate: Secondary | ICD-10-CM | POA: Diagnosis not present

## 2017-11-03 DIAGNOSIS — F4310 Post-traumatic stress disorder, unspecified: Secondary | ICD-10-CM | POA: Diagnosis not present

## 2017-11-07 DIAGNOSIS — M5442 Lumbago with sciatica, left side: Secondary | ICD-10-CM | POA: Diagnosis not present

## 2017-11-07 DIAGNOSIS — G894 Chronic pain syndrome: Secondary | ICD-10-CM | POA: Diagnosis not present

## 2017-11-07 DIAGNOSIS — M792 Neuralgia and neuritis, unspecified: Secondary | ICD-10-CM | POA: Diagnosis not present

## 2017-11-07 DIAGNOSIS — Z9689 Presence of other specified functional implants: Secondary | ICD-10-CM | POA: Diagnosis not present

## 2017-11-10 DIAGNOSIS — F411 Generalized anxiety disorder: Secondary | ICD-10-CM | POA: Diagnosis not present

## 2017-11-10 DIAGNOSIS — F4310 Post-traumatic stress disorder, unspecified: Secondary | ICD-10-CM | POA: Diagnosis not present

## 2017-11-10 DIAGNOSIS — F331 Major depressive disorder, recurrent, moderate: Secondary | ICD-10-CM | POA: Diagnosis not present

## 2017-11-17 DIAGNOSIS — F441 Dissociative fugue: Secondary | ICD-10-CM | POA: Diagnosis not present

## 2017-11-17 DIAGNOSIS — F331 Major depressive disorder, recurrent, moderate: Secondary | ICD-10-CM | POA: Diagnosis not present

## 2017-11-17 DIAGNOSIS — F4310 Post-traumatic stress disorder, unspecified: Secondary | ICD-10-CM | POA: Diagnosis not present

## 2017-11-24 DIAGNOSIS — F331 Major depressive disorder, recurrent, moderate: Secondary | ICD-10-CM | POA: Diagnosis not present

## 2017-11-24 DIAGNOSIS — F4310 Post-traumatic stress disorder, unspecified: Secondary | ICD-10-CM | POA: Diagnosis not present

## 2017-11-24 DIAGNOSIS — F411 Generalized anxiety disorder: Secondary | ICD-10-CM | POA: Diagnosis not present

## 2017-12-05 DIAGNOSIS — G894 Chronic pain syndrome: Secondary | ICD-10-CM | POA: Diagnosis not present

## 2017-12-05 DIAGNOSIS — M792 Neuralgia and neuritis, unspecified: Secondary | ICD-10-CM | POA: Diagnosis not present

## 2017-12-05 DIAGNOSIS — M5442 Lumbago with sciatica, left side: Secondary | ICD-10-CM | POA: Diagnosis not present

## 2017-12-08 DIAGNOSIS — F331 Major depressive disorder, recurrent, moderate: Secondary | ICD-10-CM | POA: Diagnosis not present

## 2017-12-08 DIAGNOSIS — F411 Generalized anxiety disorder: Secondary | ICD-10-CM | POA: Diagnosis not present

## 2017-12-08 DIAGNOSIS — F4310 Post-traumatic stress disorder, unspecified: Secondary | ICD-10-CM | POA: Diagnosis not present

## 2017-12-12 DIAGNOSIS — G894 Chronic pain syndrome: Secondary | ICD-10-CM | POA: Diagnosis not present

## 2017-12-12 DIAGNOSIS — Z9689 Presence of other specified functional implants: Secondary | ICD-10-CM | POA: Diagnosis not present

## 2017-12-13 DIAGNOSIS — R5383 Other fatigue: Secondary | ICD-10-CM | POA: Diagnosis not present

## 2017-12-13 DIAGNOSIS — Z79899 Other long term (current) drug therapy: Secondary | ICD-10-CM | POA: Diagnosis not present

## 2017-12-13 DIAGNOSIS — I129 Hypertensive chronic kidney disease with stage 1 through stage 4 chronic kidney disease, or unspecified chronic kidney disease: Secondary | ICD-10-CM | POA: Diagnosis not present

## 2017-12-13 DIAGNOSIS — N183 Chronic kidney disease, stage 3 (moderate): Secondary | ICD-10-CM | POA: Diagnosis not present

## 2017-12-13 DIAGNOSIS — R002 Palpitations: Secondary | ICD-10-CM | POA: Diagnosis not present

## 2017-12-13 DIAGNOSIS — Z23 Encounter for immunization: Secondary | ICD-10-CM | POA: Diagnosis not present

## 2017-12-29 DIAGNOSIS — F411 Generalized anxiety disorder: Secondary | ICD-10-CM | POA: Diagnosis not present

## 2017-12-29 DIAGNOSIS — F331 Major depressive disorder, recurrent, moderate: Secondary | ICD-10-CM | POA: Diagnosis not present

## 2017-12-29 DIAGNOSIS — F4310 Post-traumatic stress disorder, unspecified: Secondary | ICD-10-CM | POA: Diagnosis not present

## 2018-01-03 DIAGNOSIS — R002 Palpitations: Secondary | ICD-10-CM | POA: Diagnosis not present

## 2018-01-15 DIAGNOSIS — R002 Palpitations: Secondary | ICD-10-CM | POA: Diagnosis not present

## 2018-01-16 DIAGNOSIS — G894 Chronic pain syndrome: Secondary | ICD-10-CM | POA: Diagnosis not present

## 2018-01-26 DIAGNOSIS — F411 Generalized anxiety disorder: Secondary | ICD-10-CM | POA: Diagnosis not present

## 2018-01-26 DIAGNOSIS — F4310 Post-traumatic stress disorder, unspecified: Secondary | ICD-10-CM | POA: Diagnosis not present

## 2018-01-26 DIAGNOSIS — F331 Major depressive disorder, recurrent, moderate: Secondary | ICD-10-CM | POA: Diagnosis not present

## 2018-02-02 DIAGNOSIS — F411 Generalized anxiety disorder: Secondary | ICD-10-CM | POA: Diagnosis not present

## 2018-02-02 DIAGNOSIS — F331 Major depressive disorder, recurrent, moderate: Secondary | ICD-10-CM | POA: Diagnosis not present

## 2018-02-02 DIAGNOSIS — F4310 Post-traumatic stress disorder, unspecified: Secondary | ICD-10-CM | POA: Diagnosis not present

## 2018-02-09 DIAGNOSIS — F331 Major depressive disorder, recurrent, moderate: Secondary | ICD-10-CM | POA: Diagnosis not present

## 2018-02-09 DIAGNOSIS — F411 Generalized anxiety disorder: Secondary | ICD-10-CM | POA: Diagnosis not present

## 2018-02-09 DIAGNOSIS — F4310 Post-traumatic stress disorder, unspecified: Secondary | ICD-10-CM | POA: Diagnosis not present

## 2018-02-19 DIAGNOSIS — F331 Major depressive disorder, recurrent, moderate: Secondary | ICD-10-CM | POA: Diagnosis not present

## 2018-02-19 DIAGNOSIS — F411 Generalized anxiety disorder: Secondary | ICD-10-CM | POA: Diagnosis not present

## 2018-02-19 DIAGNOSIS — F4310 Post-traumatic stress disorder, unspecified: Secondary | ICD-10-CM | POA: Diagnosis not present

## 2018-02-28 DIAGNOSIS — G894 Chronic pain syndrome: Secondary | ICD-10-CM | POA: Diagnosis not present

## 2018-02-28 DIAGNOSIS — M5442 Lumbago with sciatica, left side: Secondary | ICD-10-CM | POA: Diagnosis not present

## 2018-02-28 DIAGNOSIS — G8929 Other chronic pain: Secondary | ICD-10-CM | POA: Diagnosis not present

## 2018-02-28 DIAGNOSIS — M792 Neuralgia and neuritis, unspecified: Secondary | ICD-10-CM | POA: Diagnosis not present

## 2018-03-05 DIAGNOSIS — F411 Generalized anxiety disorder: Secondary | ICD-10-CM | POA: Diagnosis not present

## 2018-03-05 DIAGNOSIS — F4310 Post-traumatic stress disorder, unspecified: Secondary | ICD-10-CM | POA: Diagnosis not present

## 2018-03-05 DIAGNOSIS — F331 Major depressive disorder, recurrent, moderate: Secondary | ICD-10-CM | POA: Diagnosis not present

## 2018-03-16 DIAGNOSIS — F331 Major depressive disorder, recurrent, moderate: Secondary | ICD-10-CM | POA: Diagnosis not present

## 2018-03-16 DIAGNOSIS — F4310 Post-traumatic stress disorder, unspecified: Secondary | ICD-10-CM | POA: Diagnosis not present

## 2018-03-16 DIAGNOSIS — F411 Generalized anxiety disorder: Secondary | ICD-10-CM | POA: Diagnosis not present

## 2018-03-19 DIAGNOSIS — F411 Generalized anxiety disorder: Secondary | ICD-10-CM | POA: Diagnosis not present

## 2018-03-19 DIAGNOSIS — F4310 Post-traumatic stress disorder, unspecified: Secondary | ICD-10-CM | POA: Diagnosis not present

## 2018-03-19 DIAGNOSIS — F331 Major depressive disorder, recurrent, moderate: Secondary | ICD-10-CM | POA: Diagnosis not present

## 2018-03-23 ENCOUNTER — Encounter (HOSPITAL_COMMUNITY): Payer: Self-pay

## 2018-03-23 ENCOUNTER — Other Ambulatory Visit: Payer: Self-pay

## 2018-03-23 ENCOUNTER — Emergency Department (HOSPITAL_COMMUNITY): Payer: Medicare Other

## 2018-03-23 ENCOUNTER — Observation Stay (HOSPITAL_COMMUNITY)
Admission: EM | Admit: 2018-03-23 | Discharge: 2018-03-24 | Disposition: A | Discharge status: Home / Self Care | Payer: Medicare Other | Attending: Internal Medicine | Admitting: Internal Medicine

## 2018-03-23 DIAGNOSIS — Z880 Allergy status to penicillin: Secondary | ICD-10-CM | POA: Insufficient documentation

## 2018-03-23 DIAGNOSIS — D649 Anemia, unspecified: Secondary | ICD-10-CM | POA: Diagnosis not present

## 2018-03-23 DIAGNOSIS — I10 Essential (primary) hypertension: Secondary | ICD-10-CM | POA: Diagnosis present

## 2018-03-23 DIAGNOSIS — R0602 Shortness of breath: Secondary | ICD-10-CM | POA: Diagnosis not present

## 2018-03-23 DIAGNOSIS — Z885 Allergy status to narcotic agent status: Secondary | ICD-10-CM | POA: Insufficient documentation

## 2018-03-23 DIAGNOSIS — F418 Other specified anxiety disorders: Secondary | ICD-10-CM | POA: Diagnosis present

## 2018-03-23 DIAGNOSIS — I110 Hypertensive heart disease with heart failure: Principal | ICD-10-CM | POA: Insufficient documentation

## 2018-03-23 DIAGNOSIS — I340 Nonrheumatic mitral (valve) insufficiency: Secondary | ICD-10-CM | POA: Diagnosis not present

## 2018-03-23 DIAGNOSIS — M7989 Other specified soft tissue disorders: Secondary | ICD-10-CM | POA: Diagnosis not present

## 2018-03-23 DIAGNOSIS — Z881 Allergy status to other antibiotic agents status: Secondary | ICD-10-CM | POA: Diagnosis not present

## 2018-03-23 DIAGNOSIS — Z8249 Family history of ischemic heart disease and other diseases of the circulatory system: Secondary | ICD-10-CM | POA: Diagnosis not present

## 2018-03-23 DIAGNOSIS — Z841 Family history of disorders of kidney and ureter: Secondary | ICD-10-CM | POA: Insufficient documentation

## 2018-03-23 DIAGNOSIS — N179 Acute kidney failure, unspecified: Secondary | ICD-10-CM | POA: Diagnosis present

## 2018-03-23 DIAGNOSIS — I5033 Acute on chronic diastolic (congestive) heart failure: Secondary | ICD-10-CM | POA: Diagnosis not present

## 2018-03-23 DIAGNOSIS — Z9882 Breast implant status: Secondary | ICD-10-CM | POA: Insufficient documentation

## 2018-03-23 DIAGNOSIS — R609 Edema, unspecified: Secondary | ICD-10-CM

## 2018-03-23 DIAGNOSIS — D509 Iron deficiency anemia, unspecified: Secondary | ICD-10-CM | POA: Diagnosis not present

## 2018-03-23 DIAGNOSIS — Z79899 Other long term (current) drug therapy: Secondary | ICD-10-CM | POA: Diagnosis not present

## 2018-03-23 DIAGNOSIS — I7381 Erythromelalgia: Secondary | ICD-10-CM | POA: Diagnosis not present

## 2018-03-23 DIAGNOSIS — I509 Heart failure, unspecified: Secondary | ICD-10-CM | POA: Diagnosis not present

## 2018-03-23 DIAGNOSIS — R55 Syncope and collapse: Secondary | ICD-10-CM | POA: Diagnosis not present

## 2018-03-23 DIAGNOSIS — K219 Gastro-esophageal reflux disease without esophagitis: Secondary | ICD-10-CM | POA: Diagnosis not present

## 2018-03-23 DIAGNOSIS — Z888 Allergy status to other drugs, medicaments and biological substances status: Secondary | ICD-10-CM | POA: Diagnosis not present

## 2018-03-23 DIAGNOSIS — R600 Localized edema: Secondary | ICD-10-CM | POA: Diagnosis not present

## 2018-03-23 HISTORY — DX: Other specified anxiety disorders: F41.8

## 2018-03-23 HISTORY — DX: Erythromelalgia: I73.81

## 2018-03-23 HISTORY — DX: Gastro-esophageal reflux disease without esophagitis: K21.9

## 2018-03-23 LAB — URINALYSIS, ROUTINE W REFLEX MICROSCOPIC
Bacteria, UA: NONE SEEN
Bilirubin Urine: NEGATIVE
Glucose, UA: NEGATIVE mg/dL
Hgb urine dipstick: NEGATIVE
Ketones, ur: NEGATIVE mg/dL
Nitrite: POSITIVE — AB
Protein, ur: NEGATIVE mg/dL
Specific Gravity, Urine: 1.012 (ref 1.005–1.030)
pH: 7 (ref 5.0–8.0)

## 2018-03-23 LAB — COMPREHENSIVE METABOLIC PANEL
ALT: 30 U/L (ref 0–44)
AST: 30 U/L (ref 15–41)
Albumin: 3.5 g/dL (ref 3.5–5.0)
Alkaline Phosphatase: 74 U/L (ref 38–126)
Anion gap: 6 (ref 5–15)
BUN: 35 mg/dL — ABNORMAL HIGH (ref 8–23)
CO2: 33 mmol/L — ABNORMAL HIGH (ref 22–32)
Calcium: 8.8 mg/dL — ABNORMAL LOW (ref 8.9–10.3)
Chloride: 101 mmol/L (ref 98–111)
Creatinine, Ser: 1.15 mg/dL — ABNORMAL HIGH (ref 0.44–1.00)
GFR calc Af Amer: 56 mL/min — ABNORMAL LOW (ref >60)
GFR calc non Af Amer: 48 mL/min — ABNORMAL LOW (ref >60)
Glucose, Bld: 79 mg/dL (ref 70–99)
Potassium: 4.2 mmol/L (ref 3.5–5.1)
Sodium: 140 mmol/L (ref 135–145)
Total Bilirubin: 0.4 mg/dL (ref 0.3–1.2)
Total Protein: 5.6 g/dL — ABNORMAL LOW (ref 6.5–8.1)

## 2018-03-23 LAB — CBC WITH DIFFERENTIAL/PLATELET
Abs Immature Granulocytes: 0.12 10*3/uL — ABNORMAL HIGH (ref 0.00–0.07)
Basophils Absolute: 0.1 10*3/uL (ref 0.0–0.1)
Basophils Relative: 1 %
Eosinophils Absolute: 0.4 10*3/uL (ref 0.0–0.5)
Eosinophils Relative: 7 %
HCT: 27.1 % — ABNORMAL LOW (ref 36.0–46.0)
Hemoglobin: 7.7 g/dL — ABNORMAL LOW (ref 12.0–15.0)
Immature Granulocytes: 2 %
Lymphocytes Relative: 18 %
Lymphs Abs: 1.2 10*3/uL (ref 0.7–4.0)
MCH: 22.4 pg — ABNORMAL LOW (ref 26.0–34.0)
MCHC: 28.4 g/dL — ABNORMAL LOW (ref 30.0–36.0)
MCV: 78.8 fL — ABNORMAL LOW (ref 80.0–100.0)
Monocytes Absolute: 0.6 10*3/uL (ref 0.1–1.0)
Monocytes Relative: 9 %
Neutro Abs: 3.9 10*3/uL (ref 1.7–7.7)
Neutrophils Relative %: 63 %
Platelets: UNDETERMINED 10*3/uL (ref 150–400)
RBC: 3.44 MIL/uL — ABNORMAL LOW (ref 3.87–5.11)
RDW: 16.6 % — ABNORMAL HIGH (ref 11.5–15.5)
WBC: 6.3 10*3/uL (ref 4.0–10.5)
nRBC: 0 % (ref 0.0–0.2)

## 2018-03-23 LAB — IRON AND TIBC
Iron: 23 ug/dL — ABNORMAL LOW (ref 28–170)
Saturation Ratios: 5 % — ABNORMAL LOW (ref 10.4–31.8)
TIBC: 461 ug/dL — ABNORMAL HIGH (ref 250–450)
UIBC: 438 ug/dL

## 2018-03-23 LAB — RETICULOCYTES
Immature Retic Fract: 20.2 % — ABNORMAL HIGH (ref 2.3–15.9)
RBC.: 3.07 MIL/uL — ABNORMAL LOW (ref 3.87–5.11)
Retic Count, Absolute: 60 10*3/uL (ref 19.0–186.0)
Retic Ct Pct: 1.9 % (ref 0.4–3.1)

## 2018-03-23 LAB — FERRITIN: Ferritin: 9 ng/mL — ABNORMAL LOW (ref 11–307)

## 2018-03-23 LAB — FOLATE: Folate: 8 ng/mL (ref >5.9)

## 2018-03-23 LAB — I-STAT TROPONIN, ED
Troponin i, poc: 0 ng/mL (ref 0.00–0.08)
Troponin i, poc: 0 ng/mL (ref 0.00–0.08)

## 2018-03-23 LAB — VITAMIN B12: Vitamin B-12: 333 pg/mL (ref 180–914)

## 2018-03-23 LAB — LACTATE DEHYDROGENASE: LDH: 165 U/L (ref 98–192)

## 2018-03-23 LAB — SAVE SMEAR(SSMR), FOR PROVIDER SLIDE REVIEW

## 2018-03-23 LAB — BRAIN NATRIURETIC PEPTIDE: B Natriuretic Peptide: 277.4 pg/mL — ABNORMAL HIGH (ref 0.0–100.0)

## 2018-03-23 LAB — TYPE AND SCREEN
ABO/RH(D): O POS
Antibody Screen: NEGATIVE

## 2018-03-23 LAB — ABO/RH: ABO/RH(D): O POS

## 2018-03-23 LAB — MAGNESIUM: Magnesium: 2.6 mg/dL — ABNORMAL HIGH (ref 1.7–2.4)

## 2018-03-23 MED ORDER — PANTOPRAZOLE SODIUM 40 MG PO TBEC
40.0000 mg | DELAYED_RELEASE_TABLET | Freq: Every day | ORAL | Status: DC
  Administered 2018-03-24: 40 mg via ORAL
  Filled 2018-03-23: qty 1

## 2018-03-23 MED ORDER — NALOXONE HCL 4 MG/0.1ML NA LIQD
1.0000 | Freq: Once | NASAL | Status: DC | PRN
  Filled 2018-03-23: qty 8

## 2018-03-23 MED ORDER — SODIUM CHLORIDE 0.9 % IV SOLN: 250.0000 mL | INTRAVENOUS | Status: DC | PRN

## 2018-03-23 MED ORDER — DULOXETINE HCL 30 MG PO CPEP
30.0000 mg | ORAL_CAPSULE | Freq: Every day | ORAL | Status: DC
  Administered 2018-03-24: 30 mg via ORAL
  Filled 2018-03-23: qty 1

## 2018-03-23 MED ORDER — HYDRALAZINE HCL 20 MG/ML IJ SOLN: 5.0000 mg | INTRAMUSCULAR | Status: DC | PRN

## 2018-03-23 MED ORDER — SODIUM CHLORIDE 0.9% FLUSH: 3.0000 mL | INTRAVENOUS | Status: DC | PRN

## 2018-03-23 MED ORDER — FUROSEMIDE 10 MG/ML IJ SOLN
40.0000 mg | Freq: Two times a day (BID) | INTRAMUSCULAR | Status: DC
  Administered 2018-03-23 – 2018-03-24 (×2): 40 mg via INTRAVENOUS
  Filled 2018-03-23 (×2): qty 4

## 2018-03-23 MED ORDER — ONDANSETRON HCL 4 MG/2ML IJ SOLN: 4.0000 mg | Freq: Four times a day (QID) | INTRAMUSCULAR | Status: DC | PRN

## 2018-03-23 MED ORDER — SODIUM CHLORIDE 0.9% FLUSH
3.0000 mL | Freq: Two times a day (BID) | INTRAVENOUS | Status: DC
  Administered 2018-03-23 – 2018-03-24 (×2): 3 mL via INTRAVENOUS

## 2018-03-23 MED ORDER — OXYCODONE-ACETAMINOPHEN 10-325 MG PO TABS: 1.0000 | ORAL_TABLET | Freq: Four times a day (QID) | ORAL | Status: DC | PRN

## 2018-03-23 MED ORDER — ARNICA 20 % EX TINC: Freq: Four times a day (QID) | CUTANEOUS | Status: DC | PRN

## 2018-03-23 MED ORDER — ACETAMINOPHEN 325 MG PO TABS: 650.0000 mg | ORAL_TABLET | ORAL | Status: DC | PRN

## 2018-03-23 MED ORDER — ASPIRIN EC 81 MG PO TBEC
81.0000 mg | DELAYED_RELEASE_TABLET | Freq: Every day | ORAL | Status: DC
  Administered 2018-03-24: 81 mg via ORAL
  Filled 2018-03-23: qty 1

## 2018-03-23 MED ORDER — BUPROPION HCL ER (XL) 150 MG PO TB24
150.0000 mg | ORAL_TABLET | Freq: Every day | ORAL | Status: DC
  Administered 2018-03-23: 150 mg via ORAL
  Filled 2018-03-23: qty 1

## 2018-03-23 MED ORDER — OXYCODONE-ACETAMINOPHEN 5-325 MG PO TABS
1.0000 | ORAL_TABLET | Freq: Four times a day (QID) | ORAL | Status: DC | PRN
  Administered 2018-03-23 – 2018-03-24 (×2): 1 via ORAL
  Filled 2018-03-23 (×2): qty 1

## 2018-03-23 MED ORDER — MAGNESIUM OXIDE 400 (241.3 MG) MG PO TABS
400.0000 mg | ORAL_TABLET | Freq: Every day | ORAL | Status: DC
  Administered 2018-03-23: 400 mg via ORAL
  Filled 2018-03-23: qty 1

## 2018-03-23 MED ORDER — POLYVINYL ALCOHOL 1.4 % OP SOLN
1.0000 [drp] | Freq: Four times a day (QID) | OPHTHALMIC | Status: DC | PRN
  Filled 2018-03-23: qty 15

## 2018-03-23 MED ORDER — LORATADINE 10 MG PO TABS
10.0000 mg | ORAL_TABLET | Freq: Every day | ORAL | Status: DC
  Administered 2018-03-24: 10 mg via ORAL
  Filled 2018-03-23: qty 1

## 2018-03-23 MED ORDER — TIZANIDINE HCL 4 MG PO TABS: 4.0000 mg | ORAL_TABLET | Freq: Four times a day (QID) | ORAL | Status: DC | PRN

## 2018-03-23 MED ORDER — ASPIRIN EC 81 MG PO TBEC: 81.0000 mg | DELAYED_RELEASE_TABLET | Freq: Every day | ORAL | Status: DC

## 2018-03-23 MED ORDER — METHADONE HCL 10 MG PO TABS
10.0000 mg | ORAL_TABLET | Freq: Three times a day (TID) | ORAL | Status: DC
  Administered 2018-03-24: 10 mg via ORAL
  Filled 2018-03-23: qty 1

## 2018-03-23 MED ORDER — OXYCODONE HCL 5 MG PO TABS
5.0000 mg | ORAL_TABLET | Freq: Four times a day (QID) | ORAL | Status: DC | PRN
  Administered 2018-03-23 – 2018-03-24 (×2): 5 mg via ORAL
  Filled 2018-03-23 (×2): qty 1

## 2018-03-23 MED ORDER — ZOLPIDEM TARTRATE 5 MG PO TABS
5.0000 mg | ORAL_TABLET | Freq: Every evening | ORAL | Status: DC | PRN
  Administered 2018-03-23: 5 mg via ORAL
  Filled 2018-03-23: qty 1

## 2018-03-23 NOTE — ED Provider Notes (Signed)
[facility] DEPART[facility] Provider Note   CSN: [id number] Arrival date & time: 03/23/18  1310     History   Chief Complaint Chief Complaint  Patient presents with  . Leg Swelling  . Abnormal Lab    HPI Kelly Curtis is a 72 y.o. female.  The history is provided by the patient, medical records and a relative. No language interpreter was used.  Shortness of Breath  This is a new problem. The average episode lasts 1 month. The problem occurs frequently.The current episode started more than 1 week ago. The problem has been gradually worsening. Associated symptoms include syncope, rash (at baseline), leg pain and leg swelling. Pertinent negatives include no fever, no headaches, no rhinorrhea, no neck pain, no cough, no sputum production, no hemoptysis, no wheezing, no chest pain, no vomiting and no abdominal pain. She has tried nothing for the symptoms. The treatment provided no relief. Associated medical issues comments: hx of fluid overload on lasix.    Past Medical History:  Diagnosis Date  . 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   Past Surgical History:  Procedure Laterality Date  . AUGMENTATION MAMMAPLASTY Bilateral      OB History   No obstetric history on file.      Home Medications    Prior to Admission medications   Not on File    Family History No family history on file.  Social History Social History   Tobacco Use  . Smoking status: Never Smoker  Substance Use Topics  . Alcohol use: Never    Frequency: Never  . Drug use: Never     Allergies   Vancomycin and Penicillins   Review of Systems Review of Systems  Constitutional: Positive for chills, diaphoresis and fatigue. Negative for fever.  HENT: Negative for congestion and rhinorrhea.   Respiratory: Positive for shortness of breath. Negative for cough, hemoptysis, sputum production, choking, chest tightness, wheezing and  stridor.   Cardiovascular: Positive for palpitations, leg swelling and syncope. Negative for chest pain.  Gastrointestinal: Positive for anal bleeding (several weeks ago). Negative for abdominal pain, constipation (chronic), diarrhea, nausea and vomiting.  Genitourinary: Negative for dysuria, flank pain and frequency.  Musculoskeletal: Negative for back pain, neck pain and neck stiffness.  Skin: Positive for rash (at baseline). Negative for wound.  Neurological: Positive for syncope and light-headedness. Negative for dizziness and headaches.  Psychiatric/Behavioral: Negative for agitation.  All other systems reviewed and are negative.    Physical Exam Updated Vital Signs BP 132/72 (BP Location: Left Arm)   Pulse 76   Temp (!) 97.4 F (36.3 C) (Oral)   Resp 18   SpO2 99%   Physical Exam Vitals signs and nursing note reviewed.  Constitutional:      General: She is not in acute distress.    Appearance: She is well-developed. She is not ill-appearing, toxic-appearing or diaphoretic.  HENT:     Head: Normocephalic and atraumatic.     Right Ear: External ear normal.     Left Ear: External ear normal.     Nose: Nose normal. No congestion or rhinorrhea.     Mouth/Throat:     Pharynx: No oropharyngeal exudate.  Eyes:     Conjunctiva/sclera: Conjunctivae normal.     Pupils: Pupils are equal, round, and reactive to light.  Neck:     Musculoskeletal: Normal range of motion and neck supple. No muscular tenderness.  Cardiovascular:  Rate and Rhythm: Normal rate.  Extrasystoles are present.    Heart sounds: No murmur.  Pulmonary:     Effort: Pulmonary effort is normal. No respiratory distress.     Breath sounds: No stridor. No wheezing, rhonchi or rales.  Chest:     Chest wall: No tenderness.  Abdominal:     General: There is no distension.     Tenderness: There is no abdominal tenderness. There is no rebound.  Musculoskeletal:        General: Tenderness present. No deformity or  signs of injury.     Right lower leg: Edema present.     Left lower leg: Edema present.  Skin:    General: Skin is warm.     Capillary Refill: Capillary refill takes less than 2 seconds.     Findings: Erythema (in palms and soles) present. No rash.  Neurological:     General: No focal deficit present.     Mental Status: She is alert and oriented to person, place, and time.     Motor: No abnormal muscle tone.     Coordination: Coordination normal.     Deep Tendon Reflexes: Reflexes are normal and symmetric.      ED Treatments / Results  Labs (all labs ordered are listed, but only abnormal results are displayed) Labs Reviewed  CBC WITH DIFFERENTIAL/PLATELET - Abnormal; Notable for the following components:      Result Value   RBC 3.44 (*)    Hemoglobin 7.7 (*)    HCT 27.1 (*)    MCV 78.8 (*)    MCH 22.4 (*)    MCHC 28.4 (*)    RDW 16.6 (*)    Abs Immature Granulocytes 0.12 (*)    All other components within normal limits  COMPREHENSIVE METABOLIC PANEL - Abnormal; Notable for the following components:   CO2 33 (*)    BUN 35 (*)    Creatinine, Ser 1.15 (*)    Calcium 8.8 (*)    Total Protein 5.6 (*)    GFR calc non Af Amer 48 (*)    GFR calc Af Amer 56 (*)    All other components within normal limits  MAGNESIUM - Abnormal; Notable for the following components:   Magnesium 2.6 (*)    All other components within normal limits  URINALYSIS, ROUTINE W REFLEX MICROSCOPIC - Abnormal; Notable for the following components:   Nitrite POSITIVE (*)    Leukocytes, UA MODERATE (*)    All other components within normal limits  BRAIN NATRIURETIC PEPTIDE - Abnormal; Notable for the following components:   B Natriuretic Peptide 277.4 (*)    All other components within normal limits  IRON AND TIBC - Abnormal; Notable for the following components:   Iron 23 (*)    TIBC 461 (*)    Saturation Ratios 5 (*)    All other components within normal limits  FERRITIN - Abnormal; Notable for the  following components:   Ferritin 9 (*)    All other components within normal limits  RETICULOCYTES - Abnormal; Notable for the following components:   RBC. 3.07 (*)    Immature Retic Fract 20.2 (*)    All other components within normal limits  URINE CULTURE  VITAMIN B12  FOLATE  LACTATE DEHYDROGENASE  SAVE SMEAR (SSMR)  HAPTOGLOBIN  PATHOLOGIST SMEAR REVIEW  HIV ANTIBODY (ROUTINE TESTING W REFLEX)  BASIC METABOLIC PANEL  I-STAT TROPONIN, ED  I-STAT TROPONIN, ED  TYPE AND SCREEN  ABO/RH  EKG EKG Interpretation  Date/Time:  Friday March 23 2018 17:50:33 EST Ventricular Rate:  65 PR Interval:    QRS Duration: 105 QT Interval:  466 QTC Calculation: 485 R Axis:   73 Text Interpretation:  Sinus rhythm No prior ECG for comparison. No STEMI Confirmed by Antony Blackbird [phone]) on 03/23/2018 7:32:14 PM   Radiology Dg Chest 2 View  Result Date: 03/23/2018 CLINICAL DATA:  Bilateral lower extremity edema, shortness of breath, and syncope. EXAM: CHEST - 2 VIEW COMPARISON:  None. FINDINGS: The cardiomediastinal silhouette is within normal limits. Mitral valve repair and aortic atherosclerosis are noted. No airspace consolidation, edema, pleural effusion, pneumothorax is identified. A dorsal column stimulator terminates in the lower thoracic spine. Prior right shoulder arthroplasty is noted. IMPRESSION: No active cardiopulmonary disease. Electronically Signed   By: Logan Bores M.D.   On: 03/23/2018 17:35    Procedures Procedures (including critical care time)  Medications Ordered in ED Medications  sodium chloride flush (NS) 0.9 % injection 3 mL (3 mLs Intravenous Given 03/23/18 2254)  sodium chloride flush (NS) 0.9 % injection 3 mL (has no administration in time range)  0.9 %  sodium chloride infusion (has no administration in time range)  acetaminophen (TYLENOL) tablet 650 mg (has no administration in time range)  ondansetron (ZOFRAN) injection 4 mg (has no administration  in time range)  hydrALAZINE (APRESOLINE) injection 5 mg (has no administration in time range)  zolpidem (AMBIEN) tablet 5 mg (5 mg Oral Given 03/23/18 2249)  aspirin EC tablet 81 mg (has no administration in time range)  methadone (DOLOPHINE) tablet 10 mg (has no administration in time range)  buPROPion (WELLBUTRIN XL) 24 hr tablet 150 mg (150 mg Oral Given 03/23/18 2306)  DULoxetine (CYMBALTA) DR capsule 30 mg (has no administration in time range)  magnesium oxide (MAG-OX) tablet 400 mg (400 mg Oral Given 03/23/18 2249)  pantoprazole (PROTONIX) EC tablet 40 mg (has no administration in time range)  naloxone (NARCAN) nasal spray 4 mg/0.1 mL (has no administration in time range)  tiZANidine (ZANAFLEX) tablet 4 mg (has no administration in time range)  loratadine (CLARITIN) tablet 10 mg (has no administration in time range)  polyvinyl alcohol (LIQUIFILM TEARS) 1.4 % ophthalmic solution 1 drop (has no administration in time range)  furosemide (LASIX) injection 40 mg (40 mg Intravenous Given 03/23/18 2249)  oxyCODONE-acetaminophen (PERCOCET/ROXICET) 5-325 MG per tablet 1 tablet (1 tablet Oral Given 03/23/18 2307)    And  oxyCODONE (Oxy IR/ROXICODONE) immediate release tablet 5 mg (5 mg Oral Given 03/23/18 2307)     Initial Impression / Assessment and Plan / ED Course  I have reviewed the triage vital signs and the nursing notes.  Pertinent labs & imaging results that were available during my care of the patient were reviewed by me and considered in my medical decision making (see chart for details).     Kelly Curtis is a 72 y.o. female with a past medical history significant for erythromelasia, prior mitral valve repair, and fluid overload on Lasix who presents with severe fatigue, intermittent subjective chills and diaphoresis, multiple episodes of syncope, exertional shortness of breath worsening peripheral edema, malaise, and PCP report of anemia.  Patient says that for the last month she is  been having worsening fatigue.  She says that her legs been more and more edematous despite taking the Lasix as directed.  She says that she has been having some shortness of breath with exertion.  She denies any chest pain but does  report palpitations at times.  She denies any fevers, chills, congestion, or cough.  She says that she has chronic erythema in her extremities due to her erythro-[place] and has some edema from this but it is acutely worsening.  She reports that she has had 2 syncopal episodes over the last month.  She denies syncopal episode today.  Due to worsening fatigue and sensation of malaise, patient saw her PCP today who did blood work.  Patient was then called and told she had a hemoglobin in the sevens and needed to come to the emergency department for evaluation.  She reports that earlier this month she did have some rectal bleeding in the setting of constipation and a hard impacted stool.  She reports she has not had further bleeding recently.  She denies other complaints on arrival.  On exam, lungs are clear.  Chest is nontender.  Abdomen is nontender.  Patient has edema in her lower extremities bilaterally.  Patient does have palpable DP pulses.  Patient has erythema in her hands and feet which she reports is normal for her.  Patient had some PVCs on telemetry.  Abdomen was nontender.  CVA areas nontender, back nontender.  Clinical aspect patient is having symptomatic anemia and concern for fluid overload.  Due to the syncopal episodes, exertional shortness of breath, and hemoglobin allegedly in the 7 range, anticipate patient will require admission.  Patient will have laboratory testing and work-up.  8:16 PM Patient's laboratory testing showed no evidence of heart strain with no elevation in troponin.  BNP was however elevated to 77.4.  Patient has no history of heart failure but does take Lasix.  Patient has nitrites and leukocytes in her urine but no bacteria.  Given lack of  urinary symptoms we will hold on antibiotics and send a urine culture.  Not feel she has UTI at this time.  Good patient's magnesium was slightly elevated and CBC confirms the low hemoglobin of 7.7.  I suspect patient is having symptomatic anemia causing her multiple syncopal episodes, her exertional shortness breath, her fatigue, and this worsening peripheral edema.  Chest x-ray shows no pneumonia.  Due to the patient's continued symptoms, patient will be admitted for further hemoglobin management and trending.   Final Clinical Impressions(s) / ED Diagnoses   Final diagnoses:  Symptomatic anemia  Syncope, unspecified syncope type  Peripheral edema    ED Discharge Orders    None     Clinical Impression: 1. Symptomatic anemia   2. Syncope, unspecified syncope type   3. Peripheral edema     Disposition: Admit  This note was prepared with assistance of Dragon voice recognition software. Occasional wrong-word or sound-a-like substitutions may have occurred due to the inherent limitations of voice recognition software.     Tegeler, Gwenyth Allegra, MD 03/24/18 [phone]

## 2018-03-23 NOTE — H&P (Signed)
History and Physical    Kelly Curtis CBJ:[id number] DOB: 12-04-1945 DOA: 03/23/2018  Referring MD/NP/PA:   PCP: [name], MD   Patient coming from:  The patient is coming from home.  At baseline, pt is independent for most of ADL.        Chief Complaint: Bilateral leg edema, shortness of breath, and abnormal lab  HPI: Kelly Curtis is a 72 y.o. female with medical history significant of erythromelalgia, CHF, GERD, depression, anxiety, mitral valve repair, GERD, depression, anxiety, who presents with bilateral leg edema and shortness of breath and abnormal lab.  Patient states that because of erythromelalgia, she normally has chronic erythema, pain and mild swelling in legs.  Recently her leg swelling has been worsening causing more pain.  The erythema has also worsened in legs, both hands and face.  She has mild shortness of breath, but no chest pain, cough, fever or chills.  Patient denies nausea, vomiting, diarrhea, abdominal pain, symptoms of UTI.  Patient states that because of constipation, approximately 1 month ago, she noted a little blood in stool.  She states that she had negative colonoscopy and EGD last year by [facility].  Patient was seen by PCP today, and found to have low hemoglobin 7.0.  She states that she had hemoglobin 12.3 months ago.  She was sent to ED for further evaluation and treatment.  Patient states that she had 2 syncope at 1 and 2 month ago, but not recently.  Currently patient does not have unilateral weakness, numbness or tingling his extremities with no facial droop or slurred speech.  ED Course: pt was found to have hemoglobin 7.7, BNP 227, negative troponin, urinalysis positive for nitrite with pyuria, creatinine 1.15, BUN 35, GFR 48, no fever, no tachycardia, oxygen saturation 97% on room air.  Chest x-ray negative.  Patient is placed on telemetry bed for observation.  Review of Systems:   General: no fevers, chills, no body weight gain, fatigue HEENT: no  blurry vision, hearing changes or sore throat Respiratory: has dyspnea, no coughing, wheezing CV: no chest pain, no palpitations GI: no nausea, vomiting, abdominal pain, diarrhea, constipation GU: no dysuria, burning on urination, increased urinary frequency, hematuria  Ext: has leg edema and leg pain Neuro: no unilateral weakness, numbness, or tingling, no vision change or hearing loss Skin: no rash, no skin tear. MSK: No muscle spasm, no deformity, no limitation of range of movement in spin Heme: No easy bruising.  Travel history: No recent long distant travel.  Allergy:  Allergies  Allergen Reactions  . Demerol  [Meperidine Hcl] Nausea And Vomiting  . Gabapentin Other (See Comments)    Suicidal ideation (03/23/18 - pt currently is getting small amount in pain pump with no reaction)  . Hydroxyzine Other (See Comments)    hallucinations  . Midazolam Other (See Comments)    Bradycardia  . Trazodone And Nefazodone Other (See Comments)    dizziness  . Escitalopram Oxalate Other (See Comments)    confusion  . Penicillins Rash    DID THE REACTION INVOLVE: Swelling of the face/tongue/throat, SOB, or low BP? No Sudden or severe rash/hives, skin peeling, or the inside of the mouth or nose? Yes Did it require medical treatment? Yes When did it last happen?72 years old If all above answers are "NO", may proceed with cephalosporin use.  . Vancomycin Swelling and Rash    Past Medical History:  Diagnosis Date  . Depression with anxiety   . 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  GERD (gastroesophageal reflux disease)     Past Surgical History:  Procedure Laterality Date  . AUGMENTATION MAMMAPLASTY Bilateral   . MITRAL VALVE REPAIR      Social History:  reports that she has never smoked. She does not have any smokeless tobacco history on file. She reports that she does not drink alcohol or use drugs.  Family History:  Family History  Problem Relation Age of Onset  . Heart disease  Mother   . Alzheimer's disease Mother   . Kidney disease Father   . Melanoma Sister   . Heart disease Brother      Prior to Admission medications   Not on File    Physical Exam: Vitals:   03/23/18 2130 03/23/18 2217 03/24/18 0017 03/24/18 0536  BP:  (!) 147/65 (!) 109/56 108/60  Pulse: 73 71 79 63  Resp: 20 18 18 18   Temp:  (!) 97.5 F (36.4 C) 97.7 F (36.5 C) 98.7 F (37.1 C)  TempSrc:  Oral Oral Oral  SpO2: 99% 97% 95% 95%  Weight:  67.9 kg 67.4 kg   Height:  5\' 4"  (1.626 m)     General: Not in acute distress HEENT:       Eyes: PERRL, EOMI, no scleral icterus.       ENT: No discharge from the ears and nose, no pharynx injection, no tonsillar enlargement.        Neck: Positive JVD, no bruit, no mass felt. Heme: No neck lymph node enlargement. Cardiac: S1/S2, RRR, No murmurs, No gallops or rubs. Respiratory: No rales, wheezing, rhonchi or rubs. GI: Soft, nondistended, nontender, no rebound pain, no organomegaly, BS present. GU: No hematuria Ext: 3+ pitting leg edema bilaterally. 2+DP/PT pulse bilaterally.  Patient has erythema in lower legs, both hands and face.  Has tenderness in both lower legs. Musculoskeletal: No joint deformities, No joint redness or warmth, no limitation of ROM in spin. Skin: No rashes.  Neuro: Alert, oriented X3, cranial nerves II-XII grossly intact, moves all extremities normally. Psych: Patient is not psychotic, no suicidal or hemocidal ideation.   Labs on Admission: I have personally reviewed following labs and imaging studies  CBC: Recent Labs  Lab 03/23/18 1404  WBC 6.3  NEUTROABS 3.9  HGB 7.7*  HCT 27.1*  MCV 78.8*  PLT PLATELET CLUMPS NOTED ON SMEAR, UNABLE TO ESTIMATE   Basic Metabolic Panel: Recent Labs  Lab 03/23/18 1738 03/24/18 0418  NA 140 143  K 4.2 3.9  CL 101 102  CO2 33* 32  GLUCOSE 79 94  BUN 35* 33*  CREATININE 1.15* 1.15*  CALCIUM 8.8* 8.7*  MG 2.6*  --    GFR: Estimated Creatinine Clearance: 43  mL/min (A) (by C-G formula based on SCr of 1.15 mg/dL (H)). Liver Function Tests: Recent Labs  Lab 03/23/18 1738  AST 30  ALT 30  ALKPHOS 74  BILITOT 0.4  PROT 5.6*  ALBUMIN 3.5   No results for input(s): LIPASE, AMYLASE in the last 168 hours. No results for input(s): AMMONIA in the last 168 hours. Coagulation Profile: No results for input(s): INR, PROTIME in the last 168 hours. Cardiac Enzymes: No results for input(s): CKTOTAL, CKMB, CKMBINDEX, TROPONINI in the last 168 hours. BNP (last 3 results) No results for input(s): PROBNP in the last 8760 hours. HbA1C: No results for input(s): HGBA1C in the last 72 hours. CBG: No results for input(s): GLUCAP in the last 168 hours. Lipid Profile: No results for input(s): CHOL, HDL, LDLCALC, TRIG, CHOLHDL,  LDLDIRECT in the last 72 hours. Thyroid Function Tests: No results for input(s): TSH, T4TOTAL, FREET4, T3FREE, THYROIDAB in the last 72 hours. Anemia Panel: Recent Labs    03/23/18 2131  VITAMINB12 333  FOLATE 8.0  FERRITIN 9*  TIBC 461*  IRON 23*  RETICCTPCT 1.9   Urine analysis:    Component Value Date/Time   COLORURINE YELLOW 03/23/2018 1852   APPEARANCEUR CLEAR 03/23/2018 1852   LABSPEC 1.012 03/23/2018 1852   PHURINE 7.0 03/23/2018 1852   GLUCOSEU NEGATIVE 03/23/2018 1852   HGBUR NEGATIVE 03/23/2018 1852   BILIRUBINUR NEGATIVE 03/23/2018 1852   KETONESUR NEGATIVE 03/23/2018 1852   PROTEINUR NEGATIVE 03/23/2018 1852   NITRITE POSITIVE (A) 03/23/2018 1852   LEUKOCYTESUR MODERATE (A) 03/23/2018 1852   Sepsis Labs: @LABRCNTIP (procalcitonin:4,lacticidven:4) )No results found for this or any previous visit (from the past 240 hour(s)).   Radiological Exams on Admission: Dg Chest 2 View  Result Date: 03/23/2018 CLINICAL DATA:  Bilateral lower extremity edema, shortness of breath, and syncope. EXAM: CHEST - 2 VIEW COMPARISON:  None. FINDINGS: The cardiomediastinal silhouette is within normal limits. Mitral valve  repair and aortic atherosclerosis are noted. No airspace consolidation, edema, pleural effusion, pneumothorax is identified. A dorsal column stimulator terminates in the lower thoracic spine. Prior right shoulder arthroplasty is noted. IMPRESSION: No active cardiopulmonary disease. Electronically Signed   By: Logan Bores M.D.   On: 03/23/2018 17:35     EKG: Independently reviewed.  Sinus rhythm, QTC 485, low voltage, nonspecific T wave change.  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)   HTN (hypertension)   Acute on chronic diastolic CHF (congestive heart failure) [facility]): Patient states that she had history of CHF after she had mitral valve repair 2001.  She currently is taking Lasix 20 mg daily.  Now she has worsening leg edema, elevated BNP 277, positive JVD, indicating possible CHF exacerbation.  She had 2D echo on 02/10/2012 showed EF 60-65%, indicating possible acute on chronic diastolic CHF.  -will place on tele bed for obs -Lasix 40 mg bid by IV -2d echo -Daily weights -strict I/O's -Low salt diet -will get LE venous doppler to r/o DVT  Microcytic anemia: anemia panel showed iron deficiency -Give IV Feraheme 515 mg -Start ferrous sulfate -PRN MiraLAX -Follow-up FOBT -Follow-up peripheral smear, LDH, haptoglobin  GERD: -Protonix  Depression and anxiety: Stable, no suicidal or homicidal ideations. -Continue home medications   AKI (acute kidney injury) ([place]): mild. Cre 1.15, BUN 35.  -f/u renal Fx by BMP -Avoid using renal toxic medications, such as NSAIDs  Erythromelalgia: -cooling fan -continue home pain management: Intrathecal gabapentin -Methadone, Percocet  Hypertension: -Patient is on intrathecal clonidine per pt - IV hydralazin as  needed -IV Lasix as above   DVT ppx: none (needed to rule out GI bleeding, cannot start heparin or Lovenox; need to rule out DVT, cannot start SCD at this moment); Code Status: Full code Family Communication:  Yes, patient's  Sister and niece at bed side Disposition Plan:  Anticipate discharge back to previous home environment Consults called:  none Admission status: Obs / tele     Date of Service 03/24/2018    Ivor Costa [facility] Pager [phone]  If 7PM-7AM, please contact night-coverage [email or web address] Password [facility] 03/24/2018, 7:35 AM

## 2018-03-23 NOTE — ED Notes (Signed)
  Attempted to call report.  RN will call me back. 

## 2018-03-23 NOTE — ED Triage Notes (Signed)
Pt states she has bilateral swelling in her legs. She has some skin breakdown as well. She went to PCP and her hemoglobin came back at 7. No distress noted.

## 2018-03-24 ENCOUNTER — Encounter (HOSPITAL_COMMUNITY): Payer: Self-pay

## 2018-03-24 ENCOUNTER — Observation Stay (HOSPITAL_BASED_OUTPATIENT_CLINIC_OR_DEPARTMENT_OTHER): Payer: Medicare Other

## 2018-03-24 ENCOUNTER — Other Ambulatory Visit: Payer: Self-pay

## 2018-03-24 DIAGNOSIS — I110 Hypertensive heart disease with heart failure: Secondary | ICD-10-CM | POA: Diagnosis not present

## 2018-03-24 DIAGNOSIS — I340 Nonrheumatic mitral (valve) insufficiency: Secondary | ICD-10-CM

## 2018-03-24 DIAGNOSIS — D509 Iron deficiency anemia, unspecified: Secondary | ICD-10-CM | POA: Diagnosis not present

## 2018-03-24 DIAGNOSIS — I5033 Acute on chronic diastolic (congestive) heart failure: Secondary | ICD-10-CM | POA: Diagnosis not present

## 2018-03-24 DIAGNOSIS — R609 Edema, unspecified: Secondary | ICD-10-CM | POA: Diagnosis not present

## 2018-03-24 DIAGNOSIS — I10 Essential (primary) hypertension: Secondary | ICD-10-CM | POA: Diagnosis present

## 2018-03-24 DIAGNOSIS — I7381 Erythromelalgia: Secondary | ICD-10-CM | POA: Diagnosis not present

## 2018-03-24 DIAGNOSIS — N179 Acute kidney failure, unspecified: Secondary | ICD-10-CM | POA: Diagnosis present

## 2018-03-24 LAB — BASIC METABOLIC PANEL
Anion gap: 9 (ref 5–15)
BUN: 33 mg/dL — ABNORMAL HIGH (ref 8–23)
CO2: 32 mmol/L (ref 22–32)
Calcium: 8.7 mg/dL — ABNORMAL LOW (ref 8.9–10.3)
Chloride: 102 mmol/L (ref 98–111)
Creatinine, Ser: 1.15 mg/dL — ABNORMAL HIGH (ref 0.44–1.00)
GFR calc Af Amer: 56 mL/min — ABNORMAL LOW (ref >60)
GFR calc non Af Amer: 48 mL/min — ABNORMAL LOW (ref >60)
Glucose, Bld: 94 mg/dL (ref 70–99)
Potassium: 3.9 mmol/L (ref 3.5–5.1)
Sodium: 143 mmol/L (ref 135–145)

## 2018-03-24 LAB — ECHOCARDIOGRAM COMPLETE
Height: 64 in
Weight: 2379.2 oz

## 2018-03-24 LAB — HIV ANTIBODY (ROUTINE TESTING W REFLEX): HIV Screen 4th Generation wRfx: NONREACTIVE

## 2018-03-24 MED ORDER — FERROUS SULFATE 325 (65 FE) MG PO TABS
325.0000 mg | ORAL_TABLET | Freq: Two times a day (BID) | ORAL | Status: DC
  Administered 2018-03-24: 325 mg via ORAL
  Filled 2018-03-24: qty 1

## 2018-03-24 MED ORDER — SODIUM CHLORIDE 0.9 % IV SOLN
510.0000 mg | Freq: Once | INTRAVENOUS | Status: AC
  Administered 2018-03-24: 510 mg via INTRAVENOUS
  Filled 2018-03-24: qty 17

## 2018-03-24 MED ORDER — POLYETHYLENE GLYCOL 3350 17 G PO PACK: 17.0000 g | PACK | Freq: Every day | ORAL | Status: DC | PRN

## 2018-03-24 NOTE — Progress Notes (Signed)
  Echocardiogram 2D Echocardiogram has been performed.  Kelly Curtis L Androw 03/24/2018, 10:07 AM

## 2018-03-24 NOTE — Progress Notes (Signed)
VASCULAR LAB PRELIMINARY  PRELIMINARY  PRELIMINARY  PRELIMINARY  Bilateral lower extremity venous duplex completed.    Preliminary report:  There is no DVT or SVT noted in the bilateral lower extremities.   Keelin Neville, RVT 03/24/2018, 9:50 AM

## 2018-03-24 NOTE — Discharge Summary (Signed)
Physician Discharge Summary  Dorea Gillean MRN:[id number] DOB: 12/06/1945 DOA: 03/23/2018  PCP: Stoneking, Hal, MD  Admit date: 03/23/2018 Discharge date: 03/24/2018  Admitted From: Home Disposition: Home  Recommendations for Outpatient Follow-up:  1. Follow up with PCP in 2 to 3 days 2. Please obtain CBC in one week 3. Please follow up on the following pending results: Urine culture when you see Dr. Stoneking  Home Health: No Equipment/Devices: None  Discharge Condition: Stable CODE STATUS: Full Diet recommendation: Regular; increase iron-containing foods in your diet including red meats and leafy greens for the next 90 days.  Brief/Interim Summary: Kelly Curtis is a 72 y.o. female with medical history significant of erythromelalgia, CHF, GERD, depression, anxiety, mitral valve repair, GERD, depression, anxiety, who presents with bilateral leg edema and shortness of breath and abnormal lab.  She was found to have a hemoglobin of 7.7 and placed in overnight observation.  She was given IV iron.  Urinalysis was positive for nitrite with positive leukocyte Estrace however no white cells were noted in the microscopic.  Urine culture is pending at the time of discharge. Patient was given IV Lasix and is feeling significantly better.  Echocardiogram was obtained and preliminary reading read by Dr. Croitoru shows a normal ejection fraction, trace TR, aortic valve looked okay, trivial MR, and evidence of mitral valve repair. Patient is feeling well and anxious for discharge home as she has a large family gathering this evening.  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 Patient has reached maximal benefit of hospitalization.  Discharge diagnosis, prognosis, plans, follow-up, medications and treatments discussed with the patient(or responsible party) and is in agreement with the plans as described.  Patient is stable for  discharge.  Discharge Diagnoses:  Active Problems:   Acute on chronic diastolic CHF (congestive heart failure) (HCC)   Microcytic anemia   Erythromelalgia (HCC)   GERD (gastroesophageal reflux disease)   Depression with anxiety   Leg swelling   AKI (acute kidney injury) (HCC)   HTN (hypertension)    Discharge Instructions  Discharge Instructions    Call MD for:  extreme fatigue   Complete by:  As directed    Call MD for:  persistant dizziness or light-headedness   Complete by:  As directed    Diet - low sodium heart healthy   Complete by:  As directed    Discharge instructions   Complete by:  As directed    You have requested not to be put on oral Iron therapy.  Please eat foods rich in iron including leafy greens and red meat for the next 90 days.  Follow up with Dr. StoneKing in the next 1 week for results of urine culture.  You may require antibiotics   Increase activity slowly   Complete by:  As directed      Allergies as of 03/24/2018      Reactions   Demerol  [meperidine Hcl] Nausea And Vomiting   Gabapentin Other (See Comments)   Suicidal ideation (03/23/18 - pt currently is getting small amount in pain pump with no reaction)   Hydroxyzine Other (See Comments)   hallucinations   Midazolam Other (See Comments)   Bradycardia   Trazodone And Nefazodone Other (See Comments)   dizziness   Escitalopram Oxalate Other (See Comments)   confusion   Penicillins Rash   DID THE REACTION INVOLVE: Swelling of the face/tongue/throat, SOB, or low   BP? No Sudden or severe rash/hives, skin peeling, or the inside of the mouth or nose? Yes Did it require medical treatment? Yes When did it last happen?72 years old If all above answers are "NO", may proceed with cephalosporin use.   Vancomycin Swelling, Rash      Medication List    TAKE these medications   ARNICA EX Apply 1 application topically 4 (four) times daily as needed (erythromelalgia).   ARTIFICIAL TEARS 1.4 %  ophthalmic solution Generic drug:  polyvinyl alcohol Place 1 drop into both eyes 4 (four) times daily as needed for dry eyes.   aspirin EC 81 MG tablet Take 81 mg by mouth daily.   buPROPion 150 MG 24 hr tablet Commonly known as:  WELLBUTRIN XL Take 150 mg by mouth at bedtime.   cetirizine 10 MG tablet Commonly known as:  ZYRTEC Take 10 mg by mouth daily.   DULoxetine 30 MG capsule Commonly known as:  CYMBALTA Take 30 mg by mouth daily.   furosemide 20 MG tablet Commonly known as:  LASIX Take 40 mg by mouth daily.   magnesium oxide 400 MG tablet Commonly known as:  MAG-OX Take 400 mg by mouth at bedtime.   methadone 10 MG tablet Commonly known as:  DOLOPHINE Take 10 mg by mouth every 8 (eight) hours.   NARCAN 4 MG/0.1ML Liqd nasal spray kit Generic drug:  naloxone Place 1 spray into the nose once as needed (opiod overdose).   omeprazole 20 MG capsule Commonly known as:  PRILOSEC Take 20 mg by mouth daily.   oxyCODONE-acetaminophen 10-325 MG tablet Commonly known as:  PERCOCET Take 1 tablet by mouth 5 (five) times daily.   PRESCRIPTION MEDICATION by Intrathecal route See admin instructions. Pt has intrathecal pain pump - continuous. - provided by Dr. Debarah Crape, [place], [place], [place] (compounded in [place]). Clonidine 130 mcg/day (reduced from 163.81 mcg/day on 02/28/18) and Gabapentin 26.210 mg/day   tiZANidine 4 MG tablet Commonly known as:  ZANAFLEX Take 4 mg by mouth 4 (four) times daily as needed for muscle spasms (back pain).   zolpidem 10 MG tablet Commonly known as:  AMBIEN Take 10 mg by mouth at bedtime as needed for sleep.      Follow-up Information    Stoneking, Hal, MD. Schedule an appointment as soon as possible for a visit in 3 day(s).   Specialty:  Internal Medicine Why:  need results of urine culture done while at hospital and to check for fluid  Contact information: [address] [place]  [address] [phone]          Allergies  Allergen Reactions  . Demerol  [Meperidine Hcl] Nausea And Vomiting  . Gabapentin Other (See Comments)    Suicidal ideation (03/23/18 - pt currently is getting small amount in pain pump with no reaction)  . Hydroxyzine Other (See Comments)    hallucinations  . Midazolam Other (See Comments)    Bradycardia  . Trazodone And Nefazodone Other (See Comments)    dizziness  . Escitalopram Oxalate Other (See Comments)    confusion  . Penicillins Rash    DID THE REACTION INVOLVE: Swelling of the face/tongue/throat, SOB, or low BP? No Sudden or severe rash/hives, skin peeling, or the inside of the mouth or nose? Yes Did it require medical treatment? Yes When did it last happen?72 years old If all above answers are "NO", may proceed with cephalosporin use.  . Vancomycin Swelling and Rash  Consultations:  None   Procedures/Studies: Dg Chest 2 View  Result Date: 03/23/2018 CLINICAL DATA:  Bilateral lower extremity edema, shortness of breath, and syncope. EXAM: CHEST - 2 VIEW COMPARISON:  None. FINDINGS: The cardiomediastinal silhouette is within normal limits. Mitral valve repair and aortic atherosclerosis are noted. No airspace consolidation, edema, pleural effusion, pneumothorax is identified. A dorsal column stimulator terminates in the lower thoracic spine. Prior right shoulder arthroplasty is noted. IMPRESSION: No active cardiopulmonary disease. Electronically Signed   By: Allen  Grady M.D.   On: 03/23/2018 17:35    Echocardiogram as reported in body of discharge summary hospital course   Subjective:  Patient feeling significantly better.  Swelling has resolved. Discharge Exam: Vitals:   03/24/18 0536 03/24/18 1101  BP: 108/60 131/62  Pulse: 63 65  Resp: 18   Temp: 98.7 F (37.1 C)   SpO2: 95% 96%   Vitals:   03/23/18 2217 03/24/18 0017 03/24/18 0536 03/24/18 1101  BP: (!) 147/65 (!) 109/56 108/60 131/62  Pulse: 71 79  63 65  Resp: 18 18 18   Temp: (!) 97.5 F (36.4 C) 97.7 F (36.5 C) 98.7 F (37.1 C)   TempSrc: Oral Oral Oral   SpO2: 97% 95% 95% 96%  Weight: 67.9 kg 67.4 kg    Height: 5' 4" (1.626 m)       General: Pt is alert, awake, not in acute distress Cardiovascular: RRR, S1/S2 +, no rubs, no gallops Respiratory: CTA bilaterally, no wheezing, no rhonchi Abdominal: Soft, NT, ND, bowel sounds + Extremities: no edema, no cyanosis    The results of significant diagnostics from this hospitalization (including imaging, microbiology, ancillary and laboratory) are listed below for reference.     Microbiology: No results found for this or any previous visit (from the past 240 hour(s)).   Labs: BNP (last 3 results) Recent Labs    03/23/18 1750  BNP 277.4*   Basic Metabolic Panel: Recent Labs  Lab 03/23/18 1738 03/24/18 0418  NA 140 143  K 4.2 3.9  CL 101 102  CO2 33* 32  GLUCOSE 79 94  BUN 35* 33*  CREATININE 1.15* 1.15*  CALCIUM 8.8* 8.7*  MG 2.6*  --    Liver Function Tests: Recent Labs  Lab 03/23/18 1738  AST 30  ALT 30  ALKPHOS 74  BILITOT 0.4  PROT 5.6*  ALBUMIN 3.5   No results for input(s): LIPASE, AMYLASE in the last 168 hours. No results for input(s): AMMONIA in the last 168 hours. CBC: Recent Labs  Lab 03/23/18 1404  WBC 6.3  NEUTROABS 3.9  HGB 7.7*  HCT 27.1*  MCV 78.8*  PLT PLATELET CLUMPS NOTED ON SMEAR, UNABLE TO ESTIMATE   Cardiac Enzymes: No results for input(s): CKTOTAL, CKMB, CKMBINDEX, TROPONINI in the last 168 hours. BNP: Invalid input(s): POCBNP CBG: No results for input(s): GLUCAP in the last 168 hours. D-Dimer No results for input(s): DDIMER in the last 72 hours. Hgb A1c No results for input(s): HGBA1C in the last 72 hours. Lipid Profile No results for input(s): CHOL, HDL, LDLCALC, TRIG, CHOLHDL, LDLDIRECT in the last 72 hours. Thyroid function studies No results for input(s): TSH, T4TOTAL, T3FREE, THYROIDAB in the last 72  hours.  Invalid input(s): FREET3 Anemia work up Recent Labs    03/23/18 2131  VITAMINB12 333  FOLATE 8.0  FERRITIN 9*  TIBC 461*  IRON 23*  RETICCTPCT 1.9   Urinalysis    Component Value Date/Time   COLORURINE YELLOW 03/23/2018 1852     APPEARANCEUR CLEAR 03/23/2018 1852   LABSPEC 1.012 03/23/2018 1852   PHURINE 7.0 03/23/2018 1852   GLUCOSEU NEGATIVE 03/23/2018 1852   HGBUR NEGATIVE 03/23/2018 1852   BILIRUBINUR NEGATIVE 03/23/2018 1852   KETONESUR NEGATIVE 03/23/2018 1852   PROTEINUR NEGATIVE 03/23/2018 1852   NITRITE POSITIVE (A) 03/23/2018 1852   LEUKOCYTESUR MODERATE (A) 03/23/2018 1852   Sepsis Labs Invalid input(s): PROCALCITONIN,  WBC,  LACTICIDVEN Microbiology No results found for this or any previous visit (from the past 240 hour(s)).   Time coordinating discharge: 38 minutes  SIGNED:   Lady Deutscher, MD  FACP [facility] 03/24/2018, 11:30 AM Pager   If 7PM-7AM, please contact night-coverage [email or web address] Password [email or web address]

## 2018-03-25 LAB — URINE CULTURE: Culture: >=100000 — AB

## 2018-03-25 LAB — HAPTOGLOBIN: Haptoglobin: 113 mg/dL (ref 37–355)

## 2018-03-26 LAB — PATHOLOGIST SMEAR REVIEW

## 2018-03-30 DIAGNOSIS — I129 Hypertensive chronic kidney disease with stage 1 through stage 4 chronic kidney disease, or unspecified chronic kidney disease: Secondary | ICD-10-CM | POA: Diagnosis not present

## 2018-03-30 DIAGNOSIS — N183 Chronic kidney disease, stage 3 (moderate): Secondary | ICD-10-CM | POA: Diagnosis not present

## 2018-03-30 DIAGNOSIS — I5189 Other ill-defined heart diseases: Secondary | ICD-10-CM | POA: Diagnosis not present

## 2018-03-30 DIAGNOSIS — N3000 Acute cystitis without hematuria: Secondary | ICD-10-CM | POA: Diagnosis not present

## 2018-03-30 DIAGNOSIS — D509 Iron deficiency anemia, unspecified: Secondary | ICD-10-CM | POA: Diagnosis not present

## 2018-04-02 DIAGNOSIS — F4310 Post-traumatic stress disorder, unspecified: Secondary | ICD-10-CM | POA: Diagnosis not present

## 2018-04-02 DIAGNOSIS — F331 Major depressive disorder, recurrent, moderate: Secondary | ICD-10-CM | POA: Diagnosis not present

## 2018-04-02 DIAGNOSIS — F411 Generalized anxiety disorder: Secondary | ICD-10-CM | POA: Diagnosis not present

## 2018-04-05 DIAGNOSIS — N183 Chronic kidney disease, stage 3 (moderate): Secondary | ICD-10-CM | POA: Diagnosis not present

## 2018-04-05 DIAGNOSIS — I5189 Other ill-defined heart diseases: Secondary | ICD-10-CM | POA: Diagnosis not present

## 2018-04-05 DIAGNOSIS — D509 Iron deficiency anemia, unspecified: Secondary | ICD-10-CM | POA: Diagnosis not present

## 2018-04-09 DIAGNOSIS — I129 Hypertensive chronic kidney disease with stage 1 through stage 4 chronic kidney disease, or unspecified chronic kidney disease: Secondary | ICD-10-CM | POA: Diagnosis not present

## 2018-04-09 DIAGNOSIS — F331 Major depressive disorder, recurrent, moderate: Secondary | ICD-10-CM | POA: Diagnosis not present

## 2018-04-09 DIAGNOSIS — R600 Localized edema: Secondary | ICD-10-CM | POA: Diagnosis not present

## 2018-04-09 DIAGNOSIS — I5189 Other ill-defined heart diseases: Secondary | ICD-10-CM | POA: Diagnosis not present

## 2018-04-09 DIAGNOSIS — I872 Venous insufficiency (chronic) (peripheral): Secondary | ICD-10-CM | POA: Diagnosis not present

## 2018-04-09 DIAGNOSIS — F411 Generalized anxiety disorder: Secondary | ICD-10-CM | POA: Diagnosis not present

## 2018-04-09 DIAGNOSIS — D509 Iron deficiency anemia, unspecified: Secondary | ICD-10-CM | POA: Diagnosis not present

## 2018-04-09 DIAGNOSIS — F4310 Post-traumatic stress disorder, unspecified: Secondary | ICD-10-CM | POA: Diagnosis not present

## 2018-04-09 DIAGNOSIS — N183 Chronic kidney disease, stage 3 (moderate): Secondary | ICD-10-CM | POA: Diagnosis not present

## 2018-04-11 DIAGNOSIS — G894 Chronic pain syndrome: Secondary | ICD-10-CM | POA: Diagnosis not present

## 2018-04-11 DIAGNOSIS — Z978 Presence of other specified devices: Secondary | ICD-10-CM | POA: Diagnosis not present

## 2018-04-11 DIAGNOSIS — M5442 Lumbago with sciatica, left side: Secondary | ICD-10-CM | POA: Diagnosis not present

## 2018-04-11 DIAGNOSIS — M792 Neuralgia and neuritis, unspecified: Secondary | ICD-10-CM | POA: Diagnosis not present

## 2018-04-18 DIAGNOSIS — F4310 Post-traumatic stress disorder, unspecified: Secondary | ICD-10-CM | POA: Diagnosis not present

## 2018-04-18 DIAGNOSIS — F411 Generalized anxiety disorder: Secondary | ICD-10-CM | POA: Diagnosis not present

## 2018-04-18 DIAGNOSIS — F331 Major depressive disorder, recurrent, moderate: Secondary | ICD-10-CM | POA: Diagnosis not present

## 2018-04-20 DIAGNOSIS — N183 Chronic kidney disease, stage 3 (moderate): Secondary | ICD-10-CM | POA: Diagnosis not present

## 2018-04-20 DIAGNOSIS — Z1389 Encounter for screening for other disorder: Secondary | ICD-10-CM | POA: Diagnosis not present

## 2018-04-20 DIAGNOSIS — F325 Major depressive disorder, single episode, in full remission: Secondary | ICD-10-CM | POA: Diagnosis not present

## 2018-04-20 DIAGNOSIS — Z0000 Encounter for general adult medical examination without abnormal findings: Secondary | ICD-10-CM | POA: Diagnosis not present

## 2018-04-20 DIAGNOSIS — I5189 Other ill-defined heart diseases: Secondary | ICD-10-CM | POA: Diagnosis not present

## 2018-04-20 DIAGNOSIS — I129 Hypertensive chronic kidney disease with stage 1 through stage 4 chronic kidney disease, or unspecified chronic kidney disease: Secondary | ICD-10-CM | POA: Diagnosis not present

## 2018-04-20 DIAGNOSIS — I7381 Erythromelalgia: Secondary | ICD-10-CM | POA: Diagnosis not present

## 2018-04-30 DIAGNOSIS — F411 Generalized anxiety disorder: Secondary | ICD-10-CM | POA: Diagnosis not present

## 2018-04-30 DIAGNOSIS — F331 Major depressive disorder, recurrent, moderate: Secondary | ICD-10-CM | POA: Diagnosis not present

## 2018-04-30 DIAGNOSIS — F4310 Post-traumatic stress disorder, unspecified: Secondary | ICD-10-CM | POA: Diagnosis not present

## 2018-05-03 ENCOUNTER — Other Ambulatory Visit: Payer: Self-pay | Admitting: Internal Medicine

## 2018-05-03 ENCOUNTER — Ambulatory Visit
Admission: RE | Admit: 2018-05-03 | Discharge: 2018-05-03 | Disposition: A | Discharge status: Home / Self Care | Payer: Medicare Other | Source: Ambulatory Visit | Attending: Internal Medicine | Admitting: Internal Medicine

## 2018-05-03 DIAGNOSIS — M7989 Other specified soft tissue disorders: Secondary | ICD-10-CM

## 2018-05-03 DIAGNOSIS — I7381 Erythromelalgia: Secondary | ICD-10-CM | POA: Diagnosis not present

## 2018-05-09 DIAGNOSIS — I129 Hypertensive chronic kidney disease with stage 1 through stage 4 chronic kidney disease, or unspecified chronic kidney disease: Secondary | ICD-10-CM | POA: Diagnosis not present

## 2018-05-09 DIAGNOSIS — I5189 Other ill-defined heart diseases: Secondary | ICD-10-CM | POA: Diagnosis not present

## 2018-05-09 DIAGNOSIS — N183 Chronic kidney disease, stage 3 (moderate): Secondary | ICD-10-CM | POA: Diagnosis not present

## 2018-05-09 DIAGNOSIS — I7381 Erythromelalgia: Secondary | ICD-10-CM | POA: Diagnosis not present

## 2018-05-14 DIAGNOSIS — F411 Generalized anxiety disorder: Secondary | ICD-10-CM | POA: Diagnosis not present

## 2018-05-14 DIAGNOSIS — F4310 Post-traumatic stress disorder, unspecified: Secondary | ICD-10-CM | POA: Diagnosis not present

## 2018-05-14 DIAGNOSIS — H04123 Dry eye syndrome of bilateral lacrimal glands: Secondary | ICD-10-CM | POA: Diagnosis not present

## 2018-05-14 DIAGNOSIS — H2512 Age-related nuclear cataract, left eye: Secondary | ICD-10-CM | POA: Diagnosis not present

## 2018-05-14 DIAGNOSIS — F331 Major depressive disorder, recurrent, moderate: Secondary | ICD-10-CM | POA: Diagnosis not present

## 2018-05-14 DIAGNOSIS — Z01818 Encounter for other preprocedural examination: Secondary | ICD-10-CM | POA: Diagnosis not present

## 2018-05-16 DIAGNOSIS — D509 Iron deficiency anemia, unspecified: Secondary | ICD-10-CM | POA: Diagnosis not present

## 2018-05-16 DIAGNOSIS — I7381 Erythromelalgia: Secondary | ICD-10-CM | POA: Diagnosis not present

## 2018-05-16 DIAGNOSIS — N183 Chronic kidney disease, stage 3 (moderate): Secondary | ICD-10-CM | POA: Diagnosis not present

## 2018-05-16 DIAGNOSIS — R300 Dysuria: Secondary | ICD-10-CM | POA: Diagnosis not present

## 2018-05-16 DIAGNOSIS — I129 Hypertensive chronic kidney disease with stage 1 through stage 4 chronic kidney disease, or unspecified chronic kidney disease: Secondary | ICD-10-CM | POA: Diagnosis not present

## 2018-05-16 DIAGNOSIS — I5189 Other ill-defined heart diseases: Secondary | ICD-10-CM | POA: Diagnosis not present

## 2018-05-21 DIAGNOSIS — F4310 Post-traumatic stress disorder, unspecified: Secondary | ICD-10-CM | POA: Diagnosis not present

## 2018-05-21 DIAGNOSIS — F411 Generalized anxiety disorder: Secondary | ICD-10-CM | POA: Diagnosis not present

## 2018-05-21 DIAGNOSIS — F331 Major depressive disorder, recurrent, moderate: Secondary | ICD-10-CM | POA: Diagnosis not present

## 2018-05-23 DIAGNOSIS — I7381 Erythromelalgia: Secondary | ICD-10-CM | POA: Diagnosis not present

## 2018-05-23 DIAGNOSIS — G8929 Other chronic pain: Secondary | ICD-10-CM | POA: Diagnosis not present

## 2018-05-23 DIAGNOSIS — M792 Neuralgia and neuritis, unspecified: Secondary | ICD-10-CM | POA: Diagnosis not present

## 2018-05-23 DIAGNOSIS — M5442 Lumbago with sciatica, left side: Secondary | ICD-10-CM | POA: Diagnosis not present

## 2018-05-23 DIAGNOSIS — Z79899 Other long term (current) drug therapy: Secondary | ICD-10-CM | POA: Diagnosis not present

## 2018-05-23 DIAGNOSIS — Z5181 Encounter for therapeutic drug level monitoring: Secondary | ICD-10-CM | POA: Diagnosis not present

## 2018-05-25 DIAGNOSIS — H25812 Combined forms of age-related cataract, left eye: Secondary | ICD-10-CM | POA: Diagnosis not present

## 2018-05-25 DIAGNOSIS — H2512 Age-related nuclear cataract, left eye: Secondary | ICD-10-CM | POA: Diagnosis not present

## 2018-05-28 DIAGNOSIS — Z961 Presence of intraocular lens: Secondary | ICD-10-CM | POA: Diagnosis not present

## 2018-05-28 DIAGNOSIS — H2511 Age-related nuclear cataract, right eye: Secondary | ICD-10-CM | POA: Diagnosis not present

## 2018-05-28 DIAGNOSIS — H04123 Dry eye syndrome of bilateral lacrimal glands: Secondary | ICD-10-CM | POA: Diagnosis not present

## 2018-06-12 DIAGNOSIS — F411 Generalized anxiety disorder: Secondary | ICD-10-CM | POA: Diagnosis not present

## 2018-06-12 DIAGNOSIS — F331 Major depressive disorder, recurrent, moderate: Secondary | ICD-10-CM | POA: Diagnosis not present

## 2018-06-12 DIAGNOSIS — F4310 Post-traumatic stress disorder, unspecified: Secondary | ICD-10-CM | POA: Diagnosis not present

## 2018-06-13 ENCOUNTER — Other Ambulatory Visit: Payer: Self-pay | Admitting: Geriatric Medicine

## 2018-06-13 ENCOUNTER — Ambulatory Visit
Admission: RE | Admit: 2018-06-13 | Discharge: 2018-06-13 | Disposition: A | Discharge status: Home / Self Care | Payer: Medicare Other | Source: Ambulatory Visit | Attending: Geriatric Medicine | Admitting: Geriatric Medicine

## 2018-06-13 DIAGNOSIS — I129 Hypertensive chronic kidney disease with stage 1 through stage 4 chronic kidney disease, or unspecified chronic kidney disease: Secondary | ICD-10-CM | POA: Diagnosis not present

## 2018-06-13 DIAGNOSIS — R61 Generalized hyperhidrosis: Secondary | ICD-10-CM | POA: Diagnosis not present

## 2018-06-13 DIAGNOSIS — I950 Idiopathic hypotension: Secondary | ICD-10-CM | POA: Diagnosis not present

## 2018-06-13 DIAGNOSIS — I5189 Other ill-defined heart diseases: Secondary | ICD-10-CM | POA: Diagnosis not present

## 2018-06-13 DIAGNOSIS — R8290 Unspecified abnormal findings in urine: Secondary | ICD-10-CM | POA: Diagnosis not present

## 2018-06-13 DIAGNOSIS — I7381 Erythromelalgia: Secondary | ICD-10-CM | POA: Diagnosis not present

## 2018-06-13 DIAGNOSIS — Z79899 Other long term (current) drug therapy: Secondary | ICD-10-CM | POA: Diagnosis not present

## 2018-06-13 DIAGNOSIS — N183 Chronic kidney disease, stage 3 (moderate): Secondary | ICD-10-CM | POA: Diagnosis not present

## 2018-06-14 DIAGNOSIS — R61 Generalized hyperhidrosis: Secondary | ICD-10-CM | POA: Diagnosis not present

## 2018-06-14 DIAGNOSIS — D539 Nutritional anemia, unspecified: Secondary | ICD-10-CM | POA: Diagnosis not present

## 2018-06-14 DIAGNOSIS — I950 Idiopathic hypotension: Secondary | ICD-10-CM | POA: Diagnosis not present

## 2018-06-14 DIAGNOSIS — Z79899 Other long term (current) drug therapy: Secondary | ICD-10-CM | POA: Diagnosis not present

## 2018-06-20 DIAGNOSIS — G894 Chronic pain syndrome: Secondary | ICD-10-CM | POA: Diagnosis not present

## 2018-06-20 DIAGNOSIS — Z978 Presence of other specified devices: Secondary | ICD-10-CM | POA: Diagnosis not present

## 2018-06-20 DIAGNOSIS — M792 Neuralgia and neuritis, unspecified: Secondary | ICD-10-CM | POA: Diagnosis not present

## 2018-06-20 DIAGNOSIS — I7381 Erythromelalgia: Secondary | ICD-10-CM | POA: Diagnosis not present

## 2018-06-30 DIAGNOSIS — F4310 Post-traumatic stress disorder, unspecified: Secondary | ICD-10-CM | POA: Diagnosis not present

## 2018-06-30 DIAGNOSIS — F331 Major depressive disorder, recurrent, moderate: Secondary | ICD-10-CM | POA: Diagnosis not present

## 2018-06-30 DIAGNOSIS — F411 Generalized anxiety disorder: Secondary | ICD-10-CM | POA: Diagnosis not present

## 2018-07-04 DIAGNOSIS — M5442 Lumbago with sciatica, left side: Secondary | ICD-10-CM | POA: Diagnosis not present

## 2018-07-04 DIAGNOSIS — Z978 Presence of other specified devices: Secondary | ICD-10-CM | POA: Diagnosis not present

## 2018-07-04 DIAGNOSIS — M792 Neuralgia and neuritis, unspecified: Secondary | ICD-10-CM | POA: Diagnosis not present

## 2018-07-04 DIAGNOSIS — G8929 Other chronic pain: Secondary | ICD-10-CM | POA: Diagnosis not present

## 2018-07-10 DIAGNOSIS — I5189 Other ill-defined heart diseases: Secondary | ICD-10-CM | POA: Diagnosis not present

## 2018-07-10 DIAGNOSIS — N183 Chronic kidney disease, stage 3 (moderate): Secondary | ICD-10-CM | POA: Diagnosis not present

## 2018-07-10 DIAGNOSIS — R05 Cough: Secondary | ICD-10-CM | POA: Diagnosis not present

## 2018-07-10 DIAGNOSIS — I129 Hypertensive chronic kidney disease with stage 1 through stage 4 chronic kidney disease, or unspecified chronic kidney disease: Secondary | ICD-10-CM | POA: Diagnosis not present

## 2018-07-10 DIAGNOSIS — D509 Iron deficiency anemia, unspecified: Secondary | ICD-10-CM | POA: Diagnosis not present

## 2018-07-11 DIAGNOSIS — F4310 Post-traumatic stress disorder, unspecified: Secondary | ICD-10-CM | POA: Diagnosis not present

## 2018-07-11 DIAGNOSIS — F331 Major depressive disorder, recurrent, moderate: Secondary | ICD-10-CM | POA: Diagnosis not present

## 2018-07-11 DIAGNOSIS — F411 Generalized anxiety disorder: Secondary | ICD-10-CM | POA: Diagnosis not present

## 2018-07-18 DIAGNOSIS — Z79899 Other long term (current) drug therapy: Secondary | ICD-10-CM | POA: Diagnosis not present

## 2018-07-18 DIAGNOSIS — I7381 Erythromelalgia: Secondary | ICD-10-CM | POA: Diagnosis not present

## 2018-07-18 DIAGNOSIS — G894 Chronic pain syndrome: Secondary | ICD-10-CM | POA: Diagnosis not present

## 2018-07-28 DIAGNOSIS — F4310 Post-traumatic stress disorder, unspecified: Secondary | ICD-10-CM | POA: Diagnosis not present

## 2018-07-28 DIAGNOSIS — F331 Major depressive disorder, recurrent, moderate: Secondary | ICD-10-CM | POA: Diagnosis not present

## 2018-07-28 DIAGNOSIS — F411 Generalized anxiety disorder: Secondary | ICD-10-CM | POA: Diagnosis not present

## 2018-08-08 DIAGNOSIS — I7381 Erythromelalgia: Secondary | ICD-10-CM | POA: Diagnosis not present

## 2018-08-15 DIAGNOSIS — G894 Chronic pain syndrome: Secondary | ICD-10-CM | POA: Diagnosis not present

## 2018-08-17 DIAGNOSIS — Z961 Presence of intraocular lens: Secondary | ICD-10-CM | POA: Diagnosis not present

## 2018-08-17 DIAGNOSIS — H04123 Dry eye syndrome of bilateral lacrimal glands: Secondary | ICD-10-CM | POA: Diagnosis not present

## 2018-08-17 DIAGNOSIS — S00201A Unspecified superficial injury of right eyelid and periocular area, initial encounter: Secondary | ICD-10-CM | POA: Diagnosis not present

## 2018-09-03 DIAGNOSIS — H2513 Age-related nuclear cataract, bilateral: Secondary | ICD-10-CM | POA: Diagnosis not present

## 2018-09-03 DIAGNOSIS — H2511 Age-related nuclear cataract, right eye: Secondary | ICD-10-CM | POA: Diagnosis not present

## 2018-09-10 DIAGNOSIS — Z961 Presence of intraocular lens: Secondary | ICD-10-CM | POA: Diagnosis not present

## 2018-09-10 DIAGNOSIS — H04123 Dry eye syndrome of bilateral lacrimal glands: Secondary | ICD-10-CM | POA: Diagnosis not present

## 2018-09-10 DIAGNOSIS — H2511 Age-related nuclear cataract, right eye: Secondary | ICD-10-CM | POA: Diagnosis not present

## 2018-09-12 DIAGNOSIS — R51 Headache: Secondary | ICD-10-CM | POA: Diagnosis not present

## 2018-09-12 DIAGNOSIS — S0591XA Unspecified injury of right eye and orbit, initial encounter: Secondary | ICD-10-CM | POA: Diagnosis not present

## 2018-09-12 DIAGNOSIS — M7989 Other specified soft tissue disorders: Secondary | ICD-10-CM | POA: Diagnosis not present

## 2018-09-17 DIAGNOSIS — G4700 Insomnia, unspecified: Secondary | ICD-10-CM | POA: Diagnosis not present

## 2018-09-17 DIAGNOSIS — I129 Hypertensive chronic kidney disease with stage 1 through stage 4 chronic kidney disease, or unspecified chronic kidney disease: Secondary | ICD-10-CM | POA: Diagnosis not present

## 2018-09-17 DIAGNOSIS — I5189 Other ill-defined heart diseases: Secondary | ICD-10-CM | POA: Diagnosis not present

## 2018-09-17 DIAGNOSIS — I878 Other specified disorders of veins: Secondary | ICD-10-CM | POA: Diagnosis not present

## 2018-09-17 DIAGNOSIS — N183 Chronic kidney disease, stage 3 (moderate): Secondary | ICD-10-CM | POA: Diagnosis not present

## 2018-09-18 DIAGNOSIS — H2511 Age-related nuclear cataract, right eye: Secondary | ICD-10-CM | POA: Diagnosis not present

## 2018-09-18 DIAGNOSIS — H25811 Combined forms of age-related cataract, right eye: Secondary | ICD-10-CM | POA: Diagnosis not present

## 2018-10-02 DIAGNOSIS — M792 Neuralgia and neuritis, unspecified: Secondary | ICD-10-CM | POA: Diagnosis not present

## 2018-10-02 DIAGNOSIS — M79672 Pain in left foot: Secondary | ICD-10-CM | POA: Diagnosis not present

## 2018-10-02 DIAGNOSIS — G894 Chronic pain syndrome: Secondary | ICD-10-CM | POA: Diagnosis not present

## 2018-10-02 DIAGNOSIS — M79671 Pain in right foot: Secondary | ICD-10-CM | POA: Diagnosis not present

## 2018-10-18 DIAGNOSIS — F411 Generalized anxiety disorder: Secondary | ICD-10-CM | POA: Diagnosis not present

## 2018-10-18 DIAGNOSIS — F331 Major depressive disorder, recurrent, moderate: Secondary | ICD-10-CM | POA: Diagnosis not present

## 2018-10-18 DIAGNOSIS — F4310 Post-traumatic stress disorder, unspecified: Secondary | ICD-10-CM | POA: Diagnosis not present

## 2018-11-21 DIAGNOSIS — Z79899 Other long term (current) drug therapy: Secondary | ICD-10-CM | POA: Diagnosis not present

## 2018-11-21 DIAGNOSIS — I7381 Erythromelalgia: Secondary | ICD-10-CM | POA: Diagnosis not present

## 2018-11-21 DIAGNOSIS — G894 Chronic pain syndrome: Secondary | ICD-10-CM | POA: Diagnosis not present

## 2018-11-21 DIAGNOSIS — M792 Neuralgia and neuritis, unspecified: Secondary | ICD-10-CM | POA: Diagnosis not present

## 2018-11-22 DIAGNOSIS — I129 Hypertensive chronic kidney disease with stage 1 through stage 4 chronic kidney disease, or unspecified chronic kidney disease: Secondary | ICD-10-CM | POA: Diagnosis not present

## 2018-11-22 DIAGNOSIS — F325 Major depressive disorder, single episode, in full remission: Secondary | ICD-10-CM | POA: Diagnosis not present

## 2018-11-22 DIAGNOSIS — D509 Iron deficiency anemia, unspecified: Secondary | ICD-10-CM | POA: Diagnosis not present

## 2018-11-22 DIAGNOSIS — D500 Iron deficiency anemia secondary to blood loss (chronic): Secondary | ICD-10-CM | POA: Diagnosis not present

## 2018-11-22 DIAGNOSIS — N183 Chronic kidney disease, stage 3 (moderate): Secondary | ICD-10-CM | POA: Diagnosis not present

## 2018-11-27 DIAGNOSIS — D509 Iron deficiency anemia, unspecified: Secondary | ICD-10-CM | POA: Diagnosis not present

## 2018-11-27 DIAGNOSIS — R002 Palpitations: Secondary | ICD-10-CM | POA: Diagnosis not present

## 2018-11-27 DIAGNOSIS — R42 Dizziness and giddiness: Secondary | ICD-10-CM | POA: Diagnosis not present

## 2018-11-27 DIAGNOSIS — Z79899 Other long term (current) drug therapy: Secondary | ICD-10-CM | POA: Diagnosis not present

## 2018-11-27 DIAGNOSIS — N183 Chronic kidney disease, stage 3 (moderate): Secondary | ICD-10-CM | POA: Diagnosis not present

## 2018-11-27 DIAGNOSIS — Z23 Encounter for immunization: Secondary | ICD-10-CM | POA: Diagnosis not present

## 2018-11-27 DIAGNOSIS — I7381 Erythromelalgia: Secondary | ICD-10-CM | POA: Diagnosis not present

## 2018-11-27 DIAGNOSIS — I129 Hypertensive chronic kidney disease with stage 1 through stage 4 chronic kidney disease, or unspecified chronic kidney disease: Secondary | ICD-10-CM | POA: Diagnosis not present

## 2018-12-24 DIAGNOSIS — L570 Actinic keratosis: Secondary | ICD-10-CM | POA: Diagnosis not present

## 2018-12-24 DIAGNOSIS — L304 Erythema intertrigo: Secondary | ICD-10-CM | POA: Diagnosis not present

## 2018-12-24 DIAGNOSIS — D1801 Hemangioma of skin and subcutaneous tissue: Secondary | ICD-10-CM | POA: Diagnosis not present

## 2018-12-24 DIAGNOSIS — L821 Other seborrheic keratosis: Secondary | ICD-10-CM | POA: Diagnosis not present

## 2018-12-24 DIAGNOSIS — D225 Melanocytic nevi of trunk: Secondary | ICD-10-CM | POA: Diagnosis not present

## 2018-12-26 DIAGNOSIS — D500 Iron deficiency anemia secondary to blood loss (chronic): Secondary | ICD-10-CM | POA: Diagnosis not present

## 2018-12-26 DIAGNOSIS — N183 Chronic kidney disease, stage 3 (moderate): Secondary | ICD-10-CM | POA: Diagnosis not present

## 2018-12-26 DIAGNOSIS — I129 Hypertensive chronic kidney disease with stage 1 through stage 4 chronic kidney disease, or unspecified chronic kidney disease: Secondary | ICD-10-CM | POA: Diagnosis not present

## 2018-12-26 DIAGNOSIS — D509 Iron deficiency anemia, unspecified: Secondary | ICD-10-CM | POA: Diagnosis not present

## 2018-12-26 DIAGNOSIS — F325 Major depressive disorder, single episode, in full remission: Secondary | ICD-10-CM | POA: Diagnosis not present

## 2019-01-08 DIAGNOSIS — G894 Chronic pain syndrome: Secondary | ICD-10-CM | POA: Diagnosis not present

## 2019-01-08 DIAGNOSIS — M792 Neuralgia and neuritis, unspecified: Secondary | ICD-10-CM | POA: Diagnosis not present

## 2019-01-08 DIAGNOSIS — I7381 Erythromelalgia: Secondary | ICD-10-CM | POA: Diagnosis not present

## 2019-01-14 DIAGNOSIS — D500 Iron deficiency anemia secondary to blood loss (chronic): Secondary | ICD-10-CM | POA: Diagnosis not present

## 2019-01-14 DIAGNOSIS — I129 Hypertensive chronic kidney disease with stage 1 through stage 4 chronic kidney disease, or unspecified chronic kidney disease: Secondary | ICD-10-CM | POA: Diagnosis not present

## 2019-01-14 DIAGNOSIS — F325 Major depressive disorder, single episode, in full remission: Secondary | ICD-10-CM | POA: Diagnosis not present

## 2019-01-14 DIAGNOSIS — N1831 Chronic kidney disease, stage 3a: Secondary | ICD-10-CM | POA: Diagnosis not present

## 2019-01-14 DIAGNOSIS — D509 Iron deficiency anemia, unspecified: Secondary | ICD-10-CM | POA: Diagnosis not present

## 2019-01-30 DIAGNOSIS — F331 Major depressive disorder, recurrent, moderate: Secondary | ICD-10-CM | POA: Diagnosis not present

## 2019-01-30 DIAGNOSIS — F4310 Post-traumatic stress disorder, unspecified: Secondary | ICD-10-CM | POA: Diagnosis not present

## 2019-01-30 DIAGNOSIS — F411 Generalized anxiety disorder: Secondary | ICD-10-CM | POA: Diagnosis not present

## 2019-02-06 DIAGNOSIS — M79671 Pain in right foot: Secondary | ICD-10-CM | POA: Diagnosis not present

## 2019-02-06 DIAGNOSIS — G894 Chronic pain syndrome: Secondary | ICD-10-CM | POA: Diagnosis not present

## 2019-02-06 DIAGNOSIS — M792 Neuralgia and neuritis, unspecified: Secondary | ICD-10-CM | POA: Diagnosis not present

## 2019-02-06 DIAGNOSIS — I7381 Erythromelalgia: Secondary | ICD-10-CM | POA: Diagnosis not present

## 2019-02-08 DIAGNOSIS — D500 Iron deficiency anemia secondary to blood loss (chronic): Secondary | ICD-10-CM | POA: Diagnosis not present

## 2019-02-08 DIAGNOSIS — I129 Hypertensive chronic kidney disease with stage 1 through stage 4 chronic kidney disease, or unspecified chronic kidney disease: Secondary | ICD-10-CM | POA: Diagnosis not present

## 2019-02-08 DIAGNOSIS — D509 Iron deficiency anemia, unspecified: Secondary | ICD-10-CM | POA: Diagnosis not present

## 2019-02-08 DIAGNOSIS — F325 Major depressive disorder, single episode, in full remission: Secondary | ICD-10-CM | POA: Diagnosis not present

## 2019-03-28 DIAGNOSIS — F325 Major depressive disorder, single episode, in full remission: Secondary | ICD-10-CM | POA: Diagnosis not present

## 2019-03-28 DIAGNOSIS — I129 Hypertensive chronic kidney disease with stage 1 through stage 4 chronic kidney disease, or unspecified chronic kidney disease: Secondary | ICD-10-CM | POA: Diagnosis not present

## 2019-03-28 DIAGNOSIS — D500 Iron deficiency anemia secondary to blood loss (chronic): Secondary | ICD-10-CM | POA: Diagnosis not present

## 2019-03-28 DIAGNOSIS — D509 Iron deficiency anemia, unspecified: Secondary | ICD-10-CM | POA: Diagnosis not present

## 2019-04-04 DIAGNOSIS — R05 Cough: Secondary | ICD-10-CM | POA: Diagnosis not present

## 2019-04-04 DIAGNOSIS — Z1152 Encounter for screening for COVID-19: Secondary | ICD-10-CM | POA: Diagnosis not present

## 2019-04-09 DIAGNOSIS — G4489 Other headache syndrome: Secondary | ICD-10-CM | POA: Diagnosis not present

## 2019-04-17 DIAGNOSIS — M792 Neuralgia and neuritis, unspecified: Secondary | ICD-10-CM | POA: Diagnosis not present

## 2019-04-17 DIAGNOSIS — M5442 Lumbago with sciatica, left side: Secondary | ICD-10-CM | POA: Diagnosis not present

## 2019-04-17 DIAGNOSIS — M79671 Pain in right foot: Secondary | ICD-10-CM | POA: Diagnosis not present

## 2019-04-17 DIAGNOSIS — I7381 Erythromelalgia: Secondary | ICD-10-CM | POA: Diagnosis not present

## 2019-04-17 DIAGNOSIS — Z5181 Encounter for therapeutic drug level monitoring: Secondary | ICD-10-CM | POA: Diagnosis not present

## 2019-04-17 DIAGNOSIS — Z79899 Other long term (current) drug therapy: Secondary | ICD-10-CM | POA: Diagnosis not present

## 2019-04-17 DIAGNOSIS — G894 Chronic pain syndrome: Secondary | ICD-10-CM | POA: Diagnosis not present

## 2019-04-26 DIAGNOSIS — F325 Major depressive disorder, single episode, in full remission: Secondary | ICD-10-CM | POA: Diagnosis not present

## 2019-04-26 DIAGNOSIS — I129 Hypertensive chronic kidney disease with stage 1 through stage 4 chronic kidney disease, or unspecified chronic kidney disease: Secondary | ICD-10-CM | POA: Diagnosis not present

## 2019-05-01 DIAGNOSIS — Z79899 Other long term (current) drug therapy: Secondary | ICD-10-CM | POA: Diagnosis not present

## 2019-05-01 DIAGNOSIS — I7381 Erythromelalgia: Secondary | ICD-10-CM | POA: Diagnosis not present

## 2019-05-01 DIAGNOSIS — I129 Hypertensive chronic kidney disease with stage 1 through stage 4 chronic kidney disease, or unspecified chronic kidney disease: Secondary | ICD-10-CM | POA: Diagnosis not present

## 2019-05-01 DIAGNOSIS — I5189 Other ill-defined heart diseases: Secondary | ICD-10-CM | POA: Diagnosis not present

## 2019-05-01 DIAGNOSIS — N1831 Chronic kidney disease, stage 3a: Secondary | ICD-10-CM | POA: Diagnosis not present

## 2019-05-09 DIAGNOSIS — I129 Hypertensive chronic kidney disease with stage 1 through stage 4 chronic kidney disease, or unspecified chronic kidney disease: Secondary | ICD-10-CM | POA: Diagnosis not present

## 2019-05-09 DIAGNOSIS — F325 Major depressive disorder, single episode, in full remission: Secondary | ICD-10-CM | POA: Diagnosis not present

## 2019-05-27 DIAGNOSIS — R1011 Right upper quadrant pain: Secondary | ICD-10-CM | POA: Diagnosis not present

## 2019-05-27 DIAGNOSIS — W19XXXA Unspecified fall, initial encounter: Secondary | ICD-10-CM | POA: Diagnosis not present

## 2019-05-27 DIAGNOSIS — R030 Elevated blood-pressure reading, without diagnosis of hypertension: Secondary | ICD-10-CM | POA: Diagnosis not present

## 2019-05-29 DIAGNOSIS — H26492 Other secondary cataract, left eye: Secondary | ICD-10-CM | POA: Diagnosis not present

## 2019-05-29 DIAGNOSIS — H04123 Dry eye syndrome of bilateral lacrimal glands: Secondary | ICD-10-CM | POA: Diagnosis not present

## 2019-05-29 DIAGNOSIS — Z961 Presence of intraocular lens: Secondary | ICD-10-CM | POA: Diagnosis not present

## 2019-05-29 DIAGNOSIS — H43812 Vitreous degeneration, left eye: Secondary | ICD-10-CM | POA: Diagnosis not present

## 2019-06-05 DIAGNOSIS — G8929 Other chronic pain: Secondary | ICD-10-CM | POA: Diagnosis not present

## 2019-06-05 DIAGNOSIS — M792 Neuralgia and neuritis, unspecified: Secondary | ICD-10-CM | POA: Diagnosis not present

## 2019-06-05 DIAGNOSIS — Z978 Presence of other specified devices: Secondary | ICD-10-CM | POA: Diagnosis not present

## 2019-06-05 DIAGNOSIS — M5442 Lumbago with sciatica, left side: Secondary | ICD-10-CM | POA: Diagnosis not present

## 2019-06-12 DIAGNOSIS — K824 Cholesterolosis of gallbladder: Secondary | ICD-10-CM | POA: Diagnosis not present

## 2019-06-12 DIAGNOSIS — R1011 Right upper quadrant pain: Secondary | ICD-10-CM | POA: Diagnosis not present

## 2019-06-20 ENCOUNTER — Other Ambulatory Visit: Payer: Self-pay | Admitting: Physician Assistant

## 2019-06-20 ENCOUNTER — Other Ambulatory Visit: Payer: Self-pay

## 2019-06-20 ENCOUNTER — Ambulatory Visit
Admission: RE | Admit: 2019-06-20 | Discharge: 2019-06-20 | Disposition: A | Discharge status: Home / Self Care | Payer: Medicare Other | Source: Ambulatory Visit | Attending: Physician Assistant | Admitting: Physician Assistant

## 2019-06-20 ENCOUNTER — Ambulatory Visit: Payer: Medicare Other

## 2019-06-20 DIAGNOSIS — R519 Headache, unspecified: Secondary | ICD-10-CM

## 2019-06-20 DIAGNOSIS — R002 Palpitations: Secondary | ICD-10-CM | POA: Diagnosis not present

## 2019-06-20 DIAGNOSIS — R61 Generalized hyperhidrosis: Secondary | ICD-10-CM | POA: Diagnosis not present

## 2019-06-20 DIAGNOSIS — R030 Elevated blood-pressure reading, without diagnosis of hypertension: Secondary | ICD-10-CM | POA: Diagnosis not present

## 2019-06-20 DIAGNOSIS — S0990XA Unspecified injury of head, initial encounter: Secondary | ICD-10-CM | POA: Diagnosis not present

## 2019-06-20 DIAGNOSIS — I7381 Erythromelalgia: Secondary | ICD-10-CM | POA: Diagnosis not present

## 2019-06-24 ENCOUNTER — Ambulatory Visit: Payer: Medicare Other | Attending: Internal Medicine

## 2019-06-24 DIAGNOSIS — Z23 Encounter for immunization: Secondary | ICD-10-CM

## 2019-06-24 NOTE — Progress Notes (Signed)
   Covid-19 Vaccination Clinic  Name:  Kelly Curtis    MRN: [id number] DOB: [birth year]  06/24/2019  [name] was observed post Covid-19 immunization for 15 minutes without incident. She was provided with Vaccine Information Sheet and instruction to access the V-Safe system.   [name] was instructed to call 911 with any severe reactions post vaccine: Marland Kitchen Difficulty breathing  . Swelling of face and throat  . A fast heartbeat  . A bad rash all over body  . Dizziness and weakness   Immunizations Administered    Name Date Dose VIS Date Route   Pfizer COVID-19 Vaccine 06/24/2019  1:29 PM 0.3 mL 03/08/2019 Intramuscular   Manufacturer: [place]   Lot: [id number]   [place]: [id number]

## 2019-06-26 DIAGNOSIS — F325 Major depressive disorder, single episode, in full remission: Secondary | ICD-10-CM | POA: Diagnosis not present

## 2019-06-26 DIAGNOSIS — N1831 Chronic kidney disease, stage 3a: Secondary | ICD-10-CM | POA: Diagnosis not present

## 2019-06-26 DIAGNOSIS — I129 Hypertensive chronic kidney disease with stage 1 through stage 4 chronic kidney disease, or unspecified chronic kidney disease: Secondary | ICD-10-CM | POA: Diagnosis not present

## 2019-06-27 DIAGNOSIS — R269 Unspecified abnormalities of gait and mobility: Secondary | ICD-10-CM | POA: Diagnosis not present

## 2019-06-27 DIAGNOSIS — G44209 Tension-type headache, unspecified, not intractable: Secondary | ICD-10-CM | POA: Diagnosis not present

## 2019-07-08 DIAGNOSIS — K838 Other specified diseases of biliary tract: Secondary | ICD-10-CM | POA: Diagnosis not present

## 2019-07-16 ENCOUNTER — Ambulatory Visit: Payer: Medicare Other | Attending: Internal Medicine

## 2019-07-16 DIAGNOSIS — Z23 Encounter for immunization: Secondary | ICD-10-CM

## 2019-07-16 NOTE — Progress Notes (Signed)
   Covid-19 Vaccination Clinic  Name:  Kelly Curtis    MRN: [id number] DOB: 05/09/1945  07/16/2019  Kelly Curtis was observed post Covid-19 immunization for 15 minutes without incident. She was provided with Vaccine Information Sheet and instruction to access the V-Safe system.   Kelly Curtis was instructed to call 911 with any severe reactions post vaccine: Marland Kitchen Difficulty breathing  . Swelling of face and throat  . A fast heartbeat  . A bad rash all over body  . Dizziness and weakness   Immunizations Administered    Name Date Dose VIS Date Route   Pfizer COVID-19 Vaccine 07/16/2019  2:39 PM 0.3 mL 05/22/2018 Intramuscular   Manufacturer: [place]   Lot: [id number]   [place]: [id number]

## 2019-07-18 ENCOUNTER — Ambulatory Visit: Payer: Medicare Other | Admitting: Physical Therapy

## 2019-07-19 DIAGNOSIS — I129 Hypertensive chronic kidney disease with stage 1 through stage 4 chronic kidney disease, or unspecified chronic kidney disease: Secondary | ICD-10-CM | POA: Diagnosis not present

## 2019-07-19 DIAGNOSIS — F325 Major depressive disorder, single episode, in full remission: Secondary | ICD-10-CM | POA: Diagnosis not present

## 2019-07-19 DIAGNOSIS — N1831 Chronic kidney disease, stage 3a: Secondary | ICD-10-CM | POA: Diagnosis not present

## 2019-07-24 DIAGNOSIS — M79671 Pain in right foot: Secondary | ICD-10-CM | POA: Diagnosis not present

## 2019-07-24 DIAGNOSIS — M5442 Lumbago with sciatica, left side: Secondary | ICD-10-CM | POA: Diagnosis not present

## 2019-07-24 DIAGNOSIS — M792 Neuralgia and neuritis, unspecified: Secondary | ICD-10-CM | POA: Diagnosis not present

## 2019-07-24 DIAGNOSIS — G894 Chronic pain syndrome: Secondary | ICD-10-CM | POA: Diagnosis not present

## 2019-07-26 DIAGNOSIS — Z1159 Encounter for screening for other viral diseases: Secondary | ICD-10-CM | POA: Diagnosis not present

## 2019-07-31 DIAGNOSIS — R1010 Upper abdominal pain, unspecified: Secondary | ICD-10-CM | POA: Diagnosis not present

## 2019-07-31 DIAGNOSIS — R932 Abnormal findings on diagnostic imaging of liver and biliary tract: Secondary | ICD-10-CM | POA: Diagnosis not present

## 2019-07-31 DIAGNOSIS — K3189 Other diseases of stomach and duodenum: Secondary | ICD-10-CM | POA: Diagnosis not present

## 2019-07-31 DIAGNOSIS — K838 Other specified diseases of biliary tract: Secondary | ICD-10-CM | POA: Diagnosis not present

## 2019-07-31 DIAGNOSIS — K8050 Calculus of bile duct without cholangitis or cholecystitis without obstruction: Secondary | ICD-10-CM | POA: Diagnosis not present

## 2019-07-31 DIAGNOSIS — K2970 Gastritis, unspecified, without bleeding: Secondary | ICD-10-CM | POA: Diagnosis not present

## 2019-08-05 DIAGNOSIS — K2970 Gastritis, unspecified, without bleeding: Secondary | ICD-10-CM | POA: Diagnosis not present

## 2019-08-06 ENCOUNTER — Ambulatory Visit: Payer: Medicare Other | Attending: Geriatric Medicine | Admitting: Physical Therapy

## 2019-08-09 ENCOUNTER — Other Ambulatory Visit: Payer: Self-pay | Admitting: Gastroenterology

## 2019-08-23 DIAGNOSIS — F325 Major depressive disorder, single episode, in full remission: Secondary | ICD-10-CM | POA: Diagnosis not present

## 2019-08-23 DIAGNOSIS — N1831 Chronic kidney disease, stage 3a: Secondary | ICD-10-CM | POA: Diagnosis not present

## 2019-08-23 DIAGNOSIS — I129 Hypertensive chronic kidney disease with stage 1 through stage 4 chronic kidney disease, or unspecified chronic kidney disease: Secondary | ICD-10-CM | POA: Diagnosis not present

## 2019-09-03 ENCOUNTER — Other Ambulatory Visit (HOSPITAL_COMMUNITY)
Admission: RE | Admit: 2019-09-03 | Discharge: 2019-09-03 | Disposition: A | Discharge status: Home / Self Care | Payer: Medicare Other | Source: Ambulatory Visit | Attending: Gastroenterology | Admitting: Gastroenterology

## 2019-09-03 DIAGNOSIS — Z01812 Encounter for preprocedural laboratory examination: Secondary | ICD-10-CM | POA: Diagnosis not present

## 2019-09-03 DIAGNOSIS — Z20822 Contact with and (suspected) exposure to covid-19: Secondary | ICD-10-CM | POA: Insufficient documentation

## 2019-09-03 LAB — SARS CORONAVIRUS 2 (TAT 6-24 HRS): SARS Coronavirus 2: NEGATIVE

## 2019-09-05 NOTE — H&P (Signed)
HPI:  3 mm stone noted in CBD on EUS from 07/31/2019 CBD of 8 mm caliber. She has history of iron deficiency anemia, opioid induced constipation, prior procedures from 2018- endoscopy with biopsies unremarkable(esophagus/antrum/small bowel), colonoscopy unremarkable recommended repeat in 10 years.        Labs from 06/20/19 showed a normal CBC, creatinine 1.05, GFR 54, ALP of 134, TB 0.2, AST 16, ALT 15.        Normal ESR of 10, normal TSH of 2.67, normal CRP of 2.        Ultrasound from 06/12/19 for right upper quadrant abdominal pain showed no gallstones her gallbladder wall thickening, 7 mm gallbladder polyp, CBD of 10 MM which is dilated and concerning for distal common bile duct obstruction and was recommended in MRCP.        Patient states that she continues to have pain in her feet, hands, face and ears from erythromelalgia, which has progressively gotten worse over several years, there is no treatment for it and she has been on hydrocodone regularly 5 to 6 times a day for pain control.        She has had intermittent pain below her right breast which radiates to her right scapula, once to twice a day, may last for about 20 minutes, is not related to meals and gets better with the rest. This is not associated with nausea, vomiting, fever or yellowish discoloration of skin/urine or stool.        She denies unintentional weight loss, has a fair appetite.        She has acid reflux and heartburn which is well controlled on omeprazole 20 milligrams a day.        She continues to have trouble swallowing solids and liquids specially for the last 6 months. To be noted she had an endoscopy in 2018 when she had dysphagia which showed a normal esophagus and biopsies were negative for EOE.        She is unable to get an MRI as she has metal in several areas of her body-spinal cord stimulator, pain pump, right shoulder, titanium ring in a heart.  Current Medications  TakingFurosemide 20 MG Tablet 2 tablets  Orally once in am and pm dose if 2 pound weight gain    Metoprolol Succinate 25 MG Tablet Extended Release 24 Hour 1/2 tablet Orally Once a day    Paxil(PARoxetine HCl) 10 MG Tablet 1 tablet in the evening Orally Once a day    Acetaminophen 500 MG Tablet 2 tablets as needed Orally every 6 hrs    Artificial Tears(Carboxymethylcellulose Sodium) - Solution 1 drop into each eye as needed Ophthalmic Three times daily, Notes: Systane, OTC    Aspirin Adult Low Dose(Aspirin) 81 MG Tablet Delayed Release 1 tablet Orally Once a day    BuPROPion HCl ER (XL) 150 MG Tablet Extended Release 24 Hour 1 tablet in the morning Orally Once a day    Clonidine HCl - Powder as directed intrathecal pain pump Flow rate 162.14 mcg/day; total daily max of 178.65mg/day, Notes: 162.138m/day is the flow rate. She is allowed 2 boluses per day of 15.0519mdose. Total max daily dose of clonidine is 178.51m76mith boluses    Dramamine(dimenhyDRINATE) 50 MG Tablet 1 tablet as needed Orally daily, Notes: OTC    Duloxetine HCl 40 MG Capsule Delayed Release Particles TAKE 1 CAPSULE BY MOUTH EVERY DAY    Flonase Allergy Relief(Fluticasone Propionate) 50 MCG/ACT Suspension 1 spray in each nostril Nasally  Once a day    Magnesium 400 MG Tablet 1 tablet with a meal Orally Once a day    Methadone HCl 10 MG Tablet 1 tablet Orally Every 8 hrs, Notes: scheduled 4 times daily    MiraLax(Polyethylene Glycol 3350) - Powder 3 tablespoons Orally once daily    Naproxen 500 MG Tablet 1 tablet with food or milk as needed Orally every 12 hrs    Omeprazole 20 MG Capsule Delayed Release 1 capsule Orally Once a day    Stool Softener(Docusate Sodium) 100 MG Tablet 1 tablet as needed Orally twice daily as needed, Notes: OTC    Tizanidine HCl 4 MG Tablet 1 tablet as needed Orally four times a day    Zolpidem Tartrate 10 MG Tablet 1 tablet at bedtime as needed Orally Once a day, Notes: LF 05/21/16 #90. Only using as needed; takes a full  tablet    ZyrTEC(Cetirizine HCl) 10 MG Tablet 1 tablet Orally Once a day, Notes: OTC    Hydrocodone-Acetaminophen 5-325 MG Tablet 1 tablet Orally every 6 hrs    Ferrous Gluconate 240 (27 Fe) MG Tablet 1 tablet with water or juice between meals Orally Once a day    Medication List reviewed and reconciled with the patient          Past Medical History       Degenerative arthritis of the spine.        Lumbosacral spondylosis.        Spondylolisthesis.        Osteopenia.        Erythromelalgia.        Depression/anxiety.        Dry eyes/dry mouth.        Constipation.        Chronic pain lower legs, hands, face and left ear.        Chronic edema.        Htn.        B 12 at a level of 166 on 07/2016 start oral replacement.        07/2016 iron deficiency anemia.        Poor circulation.        Fainting.        Kindney disease stage 3.        Arthritis (spine).        Bruises easily.        Blood transfusion.        Reflux.        A-fib.        Erythromelalgia in hands and feet and left ear and left side of the face.        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        Chronic edema on lasix.        derm Dr Jarome Matin, [facility] [place].        03/2017 declines breast cancer screening.        55/7322 diastolic heart failure admission.       Surgical History  miscarriage-dilatation and curettage 1971  partial hysterectomy-endometriosis 1974  appendectomy/bilateral oophorectomy 1982  mitral valve repair 2000  spinal cord stimulator implant lumbar spine-pain control 2011  spinal CORD stimulator replacement and cervical spine added 2014  right total joint replacement shoulder 2015  cervical and lumbar spinal cord stimulator replacement 2015  implantation intrathecal pain pump 2017  multiple lumbar epidural shots 2010-2017  colon.   polyps removed   pain pump 10/2015  colonoscopy 10/07/16  endoscopy 10/07/16      Family History  Father: deceased  7 yrs, Alzheimer's disease  Mother: deceased 39 yrs, alzheimers  Paternal [place] Father: deceased  Brother 1: alive 68 yrs  Sister 1: alive 22 yrs, Joylene [facility]  Sister 2: deceased 61 yrs, Melanoma  1 brother(s) , 2 sister(s) .   Sister is Lorella Nimrod Family History of Colon Cancer, Polyps, or Liver Disease.      Social History  General:   Tobacco use       cigarettes:  Former smoker     Quit in year  1995     Pack-year Hx:  20     Additional Findings: Tobacco Non-User  Ex-cigarette smoker     Tobacco history last updated  06/27/2019 no Alcohol.  Caffeine: yes, 2 servings daily, coffee, soda.  no Recreational drug use.  OCCUPATION: unemployed, nursing including Diplomatic Services operational officer, retired 2015.     Allergies  Demerol: headache - Allergy  HydrOXYzine HCl: "Blacked out" - Side Effects  Trazodone HCl: Hallucinate - Side Effects  Penicillin: Rash - Allergy  Mycins: Significant swelling - Allergy  Vancomycin HCl: Significant swelling - Allergy  Midazolam HCl: Bradycardia - Allergy  Lyrica: Suicidal ideations - Side Effects  Ferrous Sulfate: upset stomach - Side Effects      Hospitalization/Major Diagnostic Procedure  pain pump (DR.Rauck) [facility] 10/2015  swollen, sweating (CHF), Hemoglobin 7 03/2018  not in the past year 06/2019      Review of Systems  GI PROCEDURE:          no Pacemaker/ AICD, no.  no Artificial heart valves.  no MI/heart attack.  Abnormal heart rhythm  YES, MVP repair.  no Angina.  no CVA.  Hypertension  YES, up and down.  no Hypotension.  no Asthma, COPD.  no Sleep apnea.  no Seizure disorders.  Artificial joints  YES, right shoulder.  no Severe DJD.  no Diabetes.  no Significant headaches.  Vertigo  YES.  Depression/anxiety  YES.  no Abnormal bleeding.  no Kidney Disease.  no Liver disease, no.  no Chance of pregnancy.  Blood transfusion  yes.  no Method of Birth Control.  no Birth control pills.        Assessment and  plan: Proceed with an ERCP with sphincterotomy and balloon sweep with CBD stone removal. EUS had shown sludge in gallbladder, may need a cholecystectomy to follow.

## 2019-09-06 ENCOUNTER — Encounter (HOSPITAL_COMMUNITY): Payer: Self-pay | Admitting: Gastroenterology

## 2019-09-06 ENCOUNTER — Ambulatory Visit (HOSPITAL_COMMUNITY): Payer: Medicare Other

## 2019-09-06 ENCOUNTER — Ambulatory Visit (HOSPITAL_COMMUNITY): Payer: Medicare Other | Admitting: Anesthesiology

## 2019-09-06 ENCOUNTER — Ambulatory Visit (HOSPITAL_COMMUNITY)
Admission: RE | Admit: 2019-09-06 | Discharge: 2019-09-06 | Disposition: A | Discharge status: Home / Self Care | Payer: Medicare Other | Attending: Gastroenterology | Admitting: Gastroenterology

## 2019-09-06 ENCOUNTER — Other Ambulatory Visit: Payer: Self-pay

## 2019-09-06 ENCOUNTER — Other Ambulatory Visit: Payer: Self-pay | Admitting: Gastroenterology

## 2019-09-06 ENCOUNTER — Encounter (HOSPITAL_COMMUNITY)
Admission: RE | Disposition: A | Discharge status: Home / Self Care | Payer: Self-pay | Source: Home / Self Care | Attending: Gastroenterology

## 2019-09-06 DIAGNOSIS — K8050 Calculus of bile duct without cholangitis or cholecystitis without obstruction: Secondary | ICD-10-CM | POA: Diagnosis not present

## 2019-09-06 DIAGNOSIS — I5032 Chronic diastolic (congestive) heart failure: Secondary | ICD-10-CM | POA: Insufficient documentation

## 2019-09-06 DIAGNOSIS — K219 Gastro-esophageal reflux disease without esophagitis: Secondary | ICD-10-CM | POA: Diagnosis not present

## 2019-09-06 DIAGNOSIS — F419 Anxiety disorder, unspecified: Secondary | ICD-10-CM | POA: Insufficient documentation

## 2019-09-06 DIAGNOSIS — Z978 Presence of other specified devices: Secondary | ICD-10-CM | POA: Diagnosis not present

## 2019-09-06 DIAGNOSIS — F329 Major depressive disorder, single episode, unspecified: Secondary | ICD-10-CM | POA: Diagnosis not present

## 2019-09-06 DIAGNOSIS — Z87891 Personal history of nicotine dependence: Secondary | ICD-10-CM | POA: Insufficient documentation

## 2019-09-06 DIAGNOSIS — I5033 Acute on chronic diastolic (congestive) heart failure: Secondary | ICD-10-CM | POA: Diagnosis not present

## 2019-09-06 DIAGNOSIS — N183 Chronic kidney disease, stage 3 unspecified: Secondary | ICD-10-CM | POA: Diagnosis not present

## 2019-09-06 DIAGNOSIS — Z79899 Other long term (current) drug therapy: Secondary | ICD-10-CM | POA: Insufficient documentation

## 2019-09-06 DIAGNOSIS — Z7982 Long term (current) use of aspirin: Secondary | ICD-10-CM | POA: Insufficient documentation

## 2019-09-06 DIAGNOSIS — M47897 Other spondylosis, lumbosacral region: Secondary | ICD-10-CM | POA: Insufficient documentation

## 2019-09-06 DIAGNOSIS — I110 Hypertensive heart disease with heart failure: Secondary | ICD-10-CM | POA: Insufficient documentation

## 2019-09-06 DIAGNOSIS — G8929 Other chronic pain: Secondary | ICD-10-CM | POA: Diagnosis not present

## 2019-09-06 DIAGNOSIS — I7381 Erythromelalgia: Secondary | ICD-10-CM | POA: Diagnosis not present

## 2019-09-06 HISTORY — PX: ESOPHAGOGASTRODUODENOSCOPY (EGD) WITH PROPOFOL: SHX5813

## 2019-09-06 SURGERY — ESOPHAGOGASTRODUODENOSCOPY (EGD) WITH PROPOFOL
Anesthesia: General

## 2019-09-06 MED ORDER — SUCCINYLCHOLINE CHLORIDE 20 MG/ML IJ SOLN
INTRAMUSCULAR | Status: DC | PRN
  Administered 2019-09-06: 120 mg via INTRAVENOUS

## 2019-09-06 MED ORDER — FENTANYL CITRATE (PF) 100 MCG/2ML IJ SOLN
INTRAMUSCULAR | Status: DC | PRN
  Administered 2019-09-06: 50 ug via INTRAVENOUS

## 2019-09-06 MED ORDER — GLUCAGON HCL RDNA (DIAGNOSTIC) 1 MG IJ SOLR
INTRAMUSCULAR | Status: AC
  Filled 2019-09-06: qty 1

## 2019-09-06 MED ORDER — EPHEDRINE SULFATE-NACL 50-0.9 MG/10ML-% IV SOSY
PREFILLED_SYRINGE | INTRAVENOUS | Status: DC | PRN
  Administered 2019-09-06 (×3): 10 mg via INTRAVENOUS

## 2019-09-06 MED ORDER — SODIUM CHLORIDE 0.9 % IV SOLN: INTRAVENOUS | Status: DC

## 2019-09-06 MED ORDER — PROPOFOL 10 MG/ML IV BOLUS
INTRAVENOUS | Status: DC | PRN
  Administered 2019-09-06: 130 mg via INTRAVENOUS

## 2019-09-06 MED ORDER — FENTANYL CITRATE (PF) 100 MCG/2ML IJ SOLN
INTRAMUSCULAR | Status: AC
  Filled 2019-09-06: qty 2

## 2019-09-06 MED ORDER — INDOMETHACIN 50 MG RE SUPP
RECTAL | Status: AC
  Filled 2019-09-06: qty 2

## 2019-09-06 MED ORDER — INDOMETHACIN 50 MG RE SUPP
RECTAL | Status: DC | PRN
  Administered 2019-09-06: 100 mg via RECTAL

## 2019-09-06 MED ORDER — PROPOFOL 10 MG/ML IV BOLUS
INTRAVENOUS | Status: AC
  Filled 2019-09-06: qty 20

## 2019-09-06 MED ORDER — LACTATED RINGERS IV SOLN
INTRAVENOUS | Status: AC | PRN
  Administered 2019-09-06: 1000 mL via INTRAVENOUS

## 2019-09-06 MED ORDER — ONDANSETRON HCL 4 MG/2ML IJ SOLN
INTRAMUSCULAR | Status: DC | PRN
  Administered 2019-09-06: 4 mg via INTRAVENOUS

## 2019-09-06 MED ORDER — CIPROFLOXACIN IN D5W 400 MG/200ML IV SOLN
INTRAVENOUS | Status: AC
  Filled 2019-09-06: qty 200

## 2019-09-06 MED ORDER — LIDOCAINE 2% (20 MG/ML) 5 ML SYRINGE
INTRAMUSCULAR | Status: DC | PRN
  Administered 2019-09-06: 50 mg via INTRAVENOUS

## 2019-09-06 NOTE — Op Note (Signed)
[facility] Patient Name: Kelly Curtis Procedure Date: 09/06/2019 MRN: [id number] Attending MD: Ronnette Juniper , MD Date of Birth: 1945/08/29 CSN: [id number] Age: 74 Admit Type: Outpatient Procedure:                ERCP Indications:              Common bile duct stone(s) Providers:                Ronnette Juniper, MD, Vista Lawman, RN, William Dalton,                            Technician Referring MD:             [name], MD Medicines:                Monitored Anesthesia Care Complications:            No immediate complications. Estimated Blood Loss:     Estimated blood loss: none. Procedure:                Pre-Anesthesia Assessment:                           - Prior to the procedure, a History and Physical                            was performed, and patient medications and                            allergies were reviewed. The patient's tolerance of                            previous anesthesia was also reviewed. The risks                            and benefits of the procedure and the sedation                            options and risks were discussed with the patient.                            All questions were answered, and informed consent                            was obtained. Prior Anticoagulants: The patient has                            taken no previous anticoagulant or antiplatelet                            agents. ASA Grade Assessment: III - A patient with                            severe systemic disease. After reviewing the risks  and benefits, the patient was deemed in                            satisfactory condition to undergo the procedure.                           After obtaining informed consent, the scope was                            passed under direct vision. Throughout the                            procedure, the patient's blood pressure, pulse, and                            oxygen saturations were  monitored continuously. The                            TJF-Q180V ([id number]) Olympus Duodenoscope was                            introduced through the mouth, and used to inject                            contrast into without successful cannulation. The                            ERCP was extremely difficult due to challenging                            cannulation because of abnormal anatomy. The                            patient tolerated the procedure well. Scope In: Scope Out: Findings:      The scout film was normal. The esophagus was successfully intubated       under direct vision. The scope was advanced to a normal major papilla in       the descending duodenum without detailed examination of the pharynx,       larynx and associated structures, and upper GI tract. The upper GI tract       was grossly normal.      Multiple folds were noted in the ampullary area and the exact location       of ampulla was not evident.      Prolonged effort was applied to locate the ampullary opening by lifting       the folds in the duodenum with the sphinctertome and guidewire.      Despite multiple attempts to locate the ampulla, it was not found.      There was no bile noted in the periampullary area.      A faint hint of yellow fluid was noted momentarily, however, it was not       evident throughout the procedure(for over 40 minutes).      The scope was withdrawn and the procedure was terminated. Impression:  Unale to locate the ampullary opening. Moderate Sedation:      Patient did not receive moderate sedation for this procedure, but       instead received monitored anesthesia care. Recommendation:           - Resume regular diet.                           - Continue present medications.                           - Repeat attempt at ERCP by another physician in                            2-3 weeks. Procedure Code(s):        --- Professional ---                            [phone], Esophagogastroduodenoscopy, flexible,                            transoral; diagnostic, including collection of                            specimen(s) by brushing or washing, when performed                            (separate procedure) Diagnosis Code(s):        --- Professional ---                           K80.50, Calculus of bile duct without cholangitis                            or cholecystitis without obstruction CPT copyright 2019 American [employer] Association. All rights reserved. The codes documented in this report are preliminary and upon coder review may  be revised to meet current compliance requirements. Ronnette Juniper, MD 09/06/2019 10:26:27 AM This report has been signed electronically. Number of Addenda: 0

## 2019-09-06 NOTE — Anesthesia Procedure Notes (Signed)
Procedure Name: Intubation Performed by: Gean Maidens, CRNA Pre-anesthesia Checklist: Patient identified, Emergency Drugs available, Suction available and Timeout performed Patient Re-evaluated:Patient Re-evaluated prior to induction Oxygen Delivery Method: Circle system utilized Preoxygenation: Pre-oxygenation with 100% oxygen Induction Type: IV induction Ventilation: Mask ventilation without difficulty Laryngoscope Size: Mac and 3 Grade View: Grade I Tube type: Oral Tube size: 7.0 mm Number of attempts: 1 Airway Equipment and Method: Stylet Placement Confirmation: ETT inserted through vocal cords under direct vision,  positive ETCO2 and breath sounds checked- equal and bilateral Secured at: 21 cm Tube secured with: Tape Dental Injury: Teeth and Oropharynx as per pre-operative assessment

## 2019-09-06 NOTE — Anesthesia Postprocedure Evaluation (Signed)
Anesthesia Post Note  Patient: Kelly Curtis  Procedure(s) Performed: ENDOSCOPIC RETROGRADE CHOLANGIOPANCREATOGRAPHY (ERCP) WITH PROPOFOL (N/A )     Patient location during evaluation: Endoscopy Anesthesia Type: General Level of consciousness: awake and alert Pain management: pain level controlled Vital Signs Assessment: post-procedure vital signs reviewed and stable Respiratory status: spontaneous breathing, nonlabored ventilation, respiratory function stable and patient connected to nasal cannula oxygen Cardiovascular status: blood pressure returned to baseline and stable Postop Assessment: no apparent nausea or vomiting Anesthetic complications: no   No complications documented.  Last Vitals:  Vitals:   09/06/19 1040 09/06/19 1050  BP: (!) 145/64 136/69  Pulse: 68 (!) 59  Resp: 20 14  Temp:    SpO2: 94% 100%    Last Pain:  Vitals:   09/06/19 1050  TempSrc:   PainSc: 6                  Levander Katzenstein L Francille Wittmann

## 2019-09-06 NOTE — Transfer of Care (Signed)
Immediate Anesthesia Transfer of Care Note  Patient: Kelly Curtis  Procedure(s) Performed: ENDOSCOPIC RETROGRADE CHOLANGIOPANCREATOGRAPHY (ERCP) WITH PROPOFOL (N/A )  Patient Location: PACU  Anesthesia Type:General  Level of Consciousness: awake, alert  and oriented  Airway & Oxygen Therapy: Patient Spontanous Breathing and Patient connected to face mask oxygen  Post-op Assessment: Report given to RN and Post -op Vital signs reviewed and stable  Post vital signs: Reviewed and stable  Last Vitals:  Vitals Value Taken Time  BP 149/70 09/06/19 1031  Temp    Pulse 66 09/06/19 1033  Resp 17 09/06/19 1033  SpO2 100 % 09/06/19 1033  Vitals shown include unvalidated device data.  Last Pain:  Vitals:   09/06/19 0836  TempSrc: Oral  PainSc: 8          Complications: No complications documented.

## 2019-09-06 NOTE — Interval H&P Note (Signed)
History and Physical Interval Note: 73/female with 3 mm CBD stone noted on EUS,dilated CBD between 8-10 mm for an ERCP.  09/06/2019 8:53 AM  Kelly Curtis  has presented today for ERCP, with the diagnosis of Common bile duct.  The various methods of treatment have been discussed with the patient and family. After consideration of risks, benefits and other options for treatment, the patient has consented to  Procedure(s): ENDOSCOPIC RETROGRADE CHOLANGIOPANCREATOGRAPHY (ERCP) WITH PROPOFOL (N/A) as a surgical intervention.  The patient's history has been reviewed, patient examined, no change in status, stable for surgery.  I have reviewed the patient's chart and labs.  Questions were answered to the patient's satisfaction.     Kelly Curtis

## 2019-09-06 NOTE — Discharge Instructions (Signed)

## 2019-09-06 NOTE — Brief Op Note (Signed)
09/06/2019  10:40 AM  PATIENT:  Kelly Curtis  74 y.o. female  PRE-OPERATIVE DIAGNOSIS:  Common bile duct  POST-OPERATIVE DIAGNOSIS:  unable to locate ampulla  PROCEDURE:  Procedure(s): ENDOSCOPIC RETROGRADE CHOLANGIOPANCREATOGRAPHY (ERCP) WITH PROPOFOL (N/A)  SURGEON:  Surgeon(s) and Role:    Ronnette Juniper, MD - Primary  PHYSICIAN ASSISTANT:   ASSISTANTS:Shannon Love, RN, Alphonzo Grieve, Tech ANESTHESIA:   MAC  EBL:  None  BLOOD ADMINISTERED:none  DRAINS: none   LOCAL MEDICATIONS USED:  NONE  SPECIMEN:  No Specimen  DISPOSITION OF SPECIMEN:  N/A  COUNTS:  YES  TOURNIQUET:  * No tourniquets in log *  DICTATION: .Dragon Dictation  PLAN OF CARE: Discharge to home after PACU  PATIENT DISPOSITION:  PACU - hemodynamically stable.   Delay start of Pharmacological VTE agent (>24hrs) due to surgical blood loss or risk of bleeding: not applicable

## 2019-09-06 NOTE — Anesthesia Preprocedure Evaluation (Addendum)
Anesthesia Evaluation  Patient identified by MRN, date of birth, ID band Patient awake    Reviewed: Allergy & Precautions, NPO status , Patient's Chart, lab work & pertinent test results, reviewed documented beta blocker date and time   Airway Mallampati: I  TM Distance: >3 FB Neck ROM: Full    Dental no notable dental hx. (+) Teeth Intact, Dental Advisory Given   Pulmonary neg pulmonary ROS,    Pulmonary exam normal breath sounds clear to auscultation       Cardiovascular hypertension, Pt. on home beta blockers and Pt. on medications +CHF  Normal cardiovascular exam+ dysrhythmias Atrial Fibrillation + Valvular Problems/Murmurs (s/p mitral valve repair)  Rhythm:Regular Rate:Normal  TTE 2019 - Left ventricle: The cavity size was normal. Wall thickness was normal. Systolic function was normal. The estimated ejectionfraction was in the range of 60% to 65%. Wall motion was normal;there were no regional wall motion abnormalities. The study isnot technically sufficient to allow evaluation of LV diastolicfunction.  - Ventricular septum: Septal motion showed paradox. These changes are consistent with a post-thoracotomy state.  - Mitral valve: Prior procedures included surgical repair. An annular ring prosthesis was present. Transvalvular velocity was increased more than expected, due to high cardiac output. Thefindings are consistent with mild to moderate stenosis. There was mild to moderate regurgitation directed centrally. Mean gradient (D): 8 mm Hg. Gradients are measured at a heart rate of 93 bpm and are likely exaggerated due to anemia. Valve area by  continuity equation (using LVOT flow): 1.13 cm^2.    Neuro/Psych PSYCHIATRIC DISORDERS Anxiety Depression negative neurological ROS     GI/Hepatic Neg liver ROS, GERD  ,  Endo/Other  negative endocrine ROS  Renal/GU Renal InsufficiencyRenal disease  negative genitourinary    Musculoskeletal  (+) Arthritis , narcotic dependentChronic pain: on norco, methadone, spinal cord stimulator and intrathecal pump with clonidine and gabapentin both turned off by patient   Abdominal   Peds  Hematology negative hematology ROS (+) anemia ,   Anesthesia Other Findings CBD stone  Reproductive/Obstetrics                            Anesthesia Physical Anesthesia Plan  ASA: III  Anesthesia Plan: General   Post-op Pain Management:    Induction: Intravenous  PONV Risk Score and Plan: 3 and Ondansetron and Treatment may vary due to age or medical condition  Airway Management Planned: Oral ETT  Additional Equipment:   Intra-op Plan:   Post-operative Plan: Extubation in OR  Informed Consent: I have reviewed the patients History and Physical, chart, labs and discussed the procedure including the risks, benefits and alternatives for the proposed anesthesia with the patient or authorized representative who has indicated his/her understanding and acceptance.     Dental advisory given  Plan Discussed with: CRNA  Anesthesia Plan Comments:         Anesthesia Quick Evaluation

## 2019-09-09 ENCOUNTER — Encounter (HOSPITAL_COMMUNITY): Payer: Self-pay | Admitting: Gastroenterology

## 2019-09-10 DIAGNOSIS — I878 Other specified disorders of veins: Secondary | ICD-10-CM | POA: Diagnosis not present

## 2019-09-10 DIAGNOSIS — Z0000 Encounter for general adult medical examination without abnormal findings: Secondary | ICD-10-CM | POA: Diagnosis not present

## 2019-09-10 DIAGNOSIS — I5189 Other ill-defined heart diseases: Secondary | ICD-10-CM | POA: Diagnosis not present

## 2019-09-10 DIAGNOSIS — L729 Follicular cyst of the skin and subcutaneous tissue, unspecified: Secondary | ICD-10-CM | POA: Diagnosis not present

## 2019-09-10 DIAGNOSIS — I129 Hypertensive chronic kidney disease with stage 1 through stage 4 chronic kidney disease, or unspecified chronic kidney disease: Secondary | ICD-10-CM | POA: Diagnosis not present

## 2019-09-10 DIAGNOSIS — I7381 Erythromelalgia: Secondary | ICD-10-CM | POA: Diagnosis not present

## 2019-09-10 DIAGNOSIS — Z1389 Encounter for screening for other disorder: Secondary | ICD-10-CM | POA: Diagnosis not present

## 2019-09-10 DIAGNOSIS — N1831 Chronic kidney disease, stage 3a: Secondary | ICD-10-CM | POA: Diagnosis not present

## 2019-09-11 DIAGNOSIS — M792 Neuralgia and neuritis, unspecified: Secondary | ICD-10-CM | POA: Diagnosis not present

## 2019-09-11 DIAGNOSIS — G894 Chronic pain syndrome: Secondary | ICD-10-CM | POA: Diagnosis not present

## 2019-09-11 DIAGNOSIS — I7381 Erythromelalgia: Secondary | ICD-10-CM | POA: Diagnosis not present

## 2019-09-11 DIAGNOSIS — Z978 Presence of other specified devices: Secondary | ICD-10-CM | POA: Diagnosis not present

## 2019-09-13 ENCOUNTER — Other Ambulatory Visit (HOSPITAL_COMMUNITY)
Admission: RE | Admit: 2019-09-13 | Discharge: 2019-09-13 | Disposition: A | Discharge status: Home / Self Care | Payer: Medicare Other | Source: Ambulatory Visit | Attending: Gastroenterology | Admitting: Gastroenterology

## 2019-09-13 DIAGNOSIS — Z20822 Contact with and (suspected) exposure to covid-19: Secondary | ICD-10-CM | POA: Diagnosis not present

## 2019-09-13 DIAGNOSIS — Z01812 Encounter for preprocedural laboratory examination: Secondary | ICD-10-CM | POA: Insufficient documentation

## 2019-09-13 LAB — SARS CORONAVIRUS 2 (TAT 6-24 HRS): SARS Coronavirus 2: NEGATIVE

## 2019-09-16 NOTE — Progress Notes (Signed)
Pre call done. Patient covid test negative. Patient will arrive to the hospital at 11:30 for 1:00 procedure. Patient told to take metoprolol in AM with small sip of water. Confirmed nothing to eat/drink after midnight.

## 2019-09-17 ENCOUNTER — Encounter (HOSPITAL_COMMUNITY)
Admission: RE | Disposition: A | Discharge status: Home / Self Care | Payer: Self-pay | Source: Home / Self Care | Attending: Gastroenterology

## 2019-09-17 ENCOUNTER — Ambulatory Visit (HOSPITAL_COMMUNITY)
Admission: RE | Admit: 2019-09-17 | Discharge: 2019-09-17 | Disposition: A | Discharge status: Home / Self Care | Payer: Medicare Other | Attending: Gastroenterology | Admitting: Gastroenterology

## 2019-09-17 ENCOUNTER — Ambulatory Visit (HOSPITAL_COMMUNITY): Payer: Medicare Other

## 2019-09-17 ENCOUNTER — Encounter (HOSPITAL_COMMUNITY): Payer: Self-pay | Admitting: Gastroenterology

## 2019-09-17 ENCOUNTER — Ambulatory Visit (HOSPITAL_COMMUNITY): Payer: Medicare Other | Admitting: Anesthesiology

## 2019-09-17 ENCOUNTER — Other Ambulatory Visit: Payer: Self-pay

## 2019-09-17 DIAGNOSIS — Z881 Allergy status to other antibiotic agents status: Secondary | ICD-10-CM | POA: Diagnosis not present

## 2019-09-17 DIAGNOSIS — K8050 Calculus of bile duct without cholangitis or cholecystitis without obstruction: Secondary | ICD-10-CM | POA: Diagnosis not present

## 2019-09-17 DIAGNOSIS — Z79899 Other long term (current) drug therapy: Secondary | ICD-10-CM | POA: Diagnosis not present

## 2019-09-17 DIAGNOSIS — Z880 Allergy status to penicillin: Secondary | ICD-10-CM | POA: Diagnosis not present

## 2019-09-17 DIAGNOSIS — F329 Major depressive disorder, single episode, unspecified: Secondary | ICD-10-CM | POA: Diagnosis not present

## 2019-09-17 DIAGNOSIS — Z885 Allergy status to narcotic agent status: Secondary | ICD-10-CM | POA: Diagnosis not present

## 2019-09-17 DIAGNOSIS — G8929 Other chronic pain: Secondary | ICD-10-CM | POA: Diagnosis not present

## 2019-09-17 DIAGNOSIS — I4891 Unspecified atrial fibrillation: Secondary | ICD-10-CM | POA: Insufficient documentation

## 2019-09-17 DIAGNOSIS — Z87891 Personal history of nicotine dependence: Secondary | ICD-10-CM | POA: Diagnosis not present

## 2019-09-17 DIAGNOSIS — F419 Anxiety disorder, unspecified: Secondary | ICD-10-CM | POA: Diagnosis not present

## 2019-09-17 DIAGNOSIS — I050 Rheumatic mitral stenosis: Secondary | ICD-10-CM | POA: Diagnosis not present

## 2019-09-17 DIAGNOSIS — N183 Chronic kidney disease, stage 3 unspecified: Secondary | ICD-10-CM | POA: Insufficient documentation

## 2019-09-17 DIAGNOSIS — M8580 Other specified disorders of bone density and structure, unspecified site: Secondary | ICD-10-CM | POA: Insufficient documentation

## 2019-09-17 DIAGNOSIS — M47817 Spondylosis without myelopathy or radiculopathy, lumbosacral region: Secondary | ICD-10-CM | POA: Insufficient documentation

## 2019-09-17 DIAGNOSIS — I5033 Acute on chronic diastolic (congestive) heart failure: Secondary | ICD-10-CM | POA: Insufficient documentation

## 2019-09-17 DIAGNOSIS — I130 Hypertensive heart and chronic kidney disease with heart failure and stage 1 through stage 4 chronic kidney disease, or unspecified chronic kidney disease: Secondary | ICD-10-CM | POA: Insufficient documentation

## 2019-09-17 DIAGNOSIS — K219 Gastro-esophageal reflux disease without esophagitis: Secondary | ICD-10-CM | POA: Diagnosis not present

## 2019-09-17 DIAGNOSIS — Z96611 Presence of right artificial shoulder joint: Secondary | ICD-10-CM | POA: Insufficient documentation

## 2019-09-17 DIAGNOSIS — I739 Peripheral vascular disease, unspecified: Secondary | ICD-10-CM | POA: Diagnosis not present

## 2019-09-17 DIAGNOSIS — Z888 Allergy status to other drugs, medicaments and biological substances status: Secondary | ICD-10-CM | POA: Diagnosis not present

## 2019-09-17 DIAGNOSIS — Z90710 Acquired absence of both cervix and uterus: Secondary | ICD-10-CM | POA: Insufficient documentation

## 2019-09-17 DIAGNOSIS — F1120 Opioid dependence, uncomplicated: Secondary | ICD-10-CM | POA: Diagnosis not present

## 2019-09-17 DIAGNOSIS — I493 Ventricular premature depolarization: Secondary | ICD-10-CM | POA: Diagnosis not present

## 2019-09-17 DIAGNOSIS — K838 Other specified diseases of biliary tract: Secondary | ICD-10-CM

## 2019-09-17 HISTORY — PX: ENDOSCOPIC RETROGRADE CHOLANGIOPANCREATOGRAPHY (ERCP) WITH PROPOFOL: SHX5810

## 2019-09-17 HISTORY — PX: SPHINCTEROTOMY: SHX5544

## 2019-09-17 HISTORY — PX: REMOVAL OF STONES: SHX5545

## 2019-09-17 SURGERY — ENDOSCOPIC RETROGRADE CHOLANGIOPANCREATOGRAPHY (ERCP) WITH PROPOFOL
Anesthesia: General

## 2019-09-17 MED ORDER — FENTANYL CITRATE (PF) 100 MCG/2ML IJ SOLN
INTRAMUSCULAR | Status: DC | PRN
  Administered 2019-09-17 (×2): 50 ug via INTRAVENOUS

## 2019-09-17 MED ORDER — PROMETHAZINE HCL 25 MG/ML IJ SOLN
INTRAMUSCULAR | Status: AC
  Filled 2019-09-17: qty 1

## 2019-09-17 MED ORDER — MIDAZOLAM HCL 5 MG/5ML IJ SOLN
INTRAMUSCULAR | Status: DC | PRN
Start: 2019-09-17 — End: 2019-09-17
  Administered 2019-09-17: 1 mg via INTRAVENOUS

## 2019-09-17 MED ORDER — PROMETHAZINE HCL 25 MG/ML IJ SOLN
6.2500 mg | Freq: Once | INTRAMUSCULAR | Status: AC
  Administered 2019-09-17: 6.25 mg via INTRAVENOUS

## 2019-09-17 MED ORDER — EPHEDRINE SULFATE 50 MG/ML IJ SOLN
INTRAMUSCULAR | Status: DC | PRN
Start: 2019-09-17 — End: 2019-09-17
  Administered 2019-09-17 (×2): 10 mg via INTRAVENOUS

## 2019-09-17 MED ORDER — ONDANSETRON HCL 4 MG/2ML IJ SOLN
INTRAMUSCULAR | Status: DC | PRN
  Administered 2019-09-17: 4 mg via INTRAVENOUS

## 2019-09-17 MED ORDER — CIPROFLOXACIN IN D5W 400 MG/200ML IV SOLN
INTRAVENOUS | Status: DC | PRN
Start: 2019-09-17 — End: 2019-09-17
  Administered 2019-09-17: 400 mg via INTRAVENOUS

## 2019-09-17 MED ORDER — LIDOCAINE HCL (CARDIAC) PF 100 MG/5ML IV SOSY
PREFILLED_SYRINGE | INTRAVENOUS | Status: DC | PRN
  Administered 2019-09-17: 40 mg via INTRAVENOUS

## 2019-09-17 MED ORDER — CIPROFLOXACIN IN D5W 400 MG/200ML IV SOLN: 400.0000 mg | Freq: Once | INTRAVENOUS | Status: DC

## 2019-09-17 MED ORDER — CIPROFLOXACIN IN D5W 400 MG/200ML IV SOLN
INTRAVENOUS | Status: AC
  Filled 2019-09-17: qty 200

## 2019-09-17 MED ORDER — FENTANYL CITRATE (PF) 100 MCG/2ML IJ SOLN
INTRAMUSCULAR | Status: AC
  Filled 2019-09-17: qty 2

## 2019-09-17 MED ORDER — SODIUM CHLORIDE 0.9 % IV SOLN: INTRAVENOUS | Status: DC

## 2019-09-17 MED ORDER — DEXAMETHASONE SODIUM PHOSPHATE 10 MG/ML IJ SOLN
INTRAMUSCULAR | Status: DC | PRN
  Administered 2019-09-17: 8 mg via INTRAVENOUS

## 2019-09-17 MED ORDER — MIDAZOLAM HCL 2 MG/2ML IJ SOLN
INTRAMUSCULAR | Status: AC
  Filled 2019-09-17: qty 2

## 2019-09-17 MED ORDER — ROCURONIUM BROMIDE 100 MG/10ML IV SOLN
INTRAVENOUS | Status: DC | PRN
  Administered 2019-09-17: 60 mg via INTRAVENOUS

## 2019-09-17 MED ORDER — INDOMETHACIN 50 MG RE SUPP
RECTAL | Status: AC
  Filled 2019-09-17: qty 2

## 2019-09-17 MED ORDER — LACTATED RINGERS IV SOLN
INTRAVENOUS | Status: DC
  Administered 2019-09-17: 1000 mL via INTRAVENOUS

## 2019-09-17 MED ORDER — SUGAMMADEX SODIUM 200 MG/2ML IV SOLN
INTRAVENOUS | Status: DC | PRN
  Administered 2019-09-17: 300 mg via INTRAVENOUS

## 2019-09-17 MED ORDER — GLUCAGON HCL RDNA (DIAGNOSTIC) 1 MG IJ SOLR
INTRAMUSCULAR | Status: AC
  Filled 2019-09-17: qty 1

## 2019-09-17 MED ORDER — PROPOFOL 10 MG/ML IV BOLUS
INTRAVENOUS | Status: DC | PRN
  Administered 2019-09-17: 150 mg via INTRAVENOUS

## 2019-09-17 NOTE — Anesthesia Preprocedure Evaluation (Addendum)
Anesthesia Evaluation  Patient identified by MRN, date of birth, ID band Patient awake    Reviewed: Allergy & Precautions, NPO status , Patient's Chart, lab work & pertinent test results, reviewed documented beta blocker date and time   History of Anesthesia Complications Negative for: history of anesthetic complications  Airway Mallampati: I  TM Distance: >3 FB Neck ROM: Full    Dental  (+) Dental Advisory Given, Teeth Intact   Pulmonary neg pulmonary ROS,  09/13/2019 SARS coronavirus NEG   breath sounds clear to auscultation       Cardiovascular hypertension, Pt. on medications and Pt. on home beta blockers (-) angina+ Peripheral Vascular Disease  + dysrhythmias (PVCs)  Rhythm:Regular Rate:Normal  '19 ECHO: EF 60-65%, mild to moderate mitral stenosis, mild to moderate mitral regurgitation directed centrally. Mean gradient (D): 8 mm Hg.   Neuro/Psych Anxiety Depression narcotic dependent Chronic pain: on norco, methadone, spinal cord stimulator and intrathecal pump with clonidine and gabapentin both turned off by patient     GI/Hepatic GERD  Medicated and Controlled,(+)     substance abuse  , CBD stone: for ERCP   Endo/Other  negative endocrine ROS  Renal/GU Renal InsufficiencyRenal disease     Musculoskeletal  (+) narcotic dependent  Abdominal   Peds  Hematology negative hematology ROS (+)   Anesthesia Other Findings   Reproductive/Obstetrics                            Anesthesia Physical Anesthesia Plan  ASA: III  Anesthesia Plan: General   Post-op Pain Management:    Induction: Intravenous  PONV Risk Score and Plan: 3 and Ondansetron, Dexamethasone and Treatment may vary due to age or medical condition  Airway Management Planned: Oral ETT  Additional Equipment: None  Intra-op Plan:   Post-operative Plan: Extubation in OR  Informed Consent: I have reviewed the  patients History and Physical, chart, labs and discussed the procedure including the risks, benefits and alternatives for the proposed anesthesia with the patient or authorized representative who has indicated his/her understanding and acceptance.     Dental advisory given  Plan Discussed with: CRNA and Surgeon  Anesthesia Plan Comments:        Anesthesia Quick Evaluation

## 2019-09-17 NOTE — Op Note (Signed)
[facility] Patient Name: Kelly Curtis Procedure Date: 09/17/2019 MRN: [id number] Attending MD: Clarene Essex , MD Date of Birth: February 14, 1946 CSN: [id number] Age: 74 Admit Type: Outpatient Procedure:                ERCP Indications:              Bile duct stone(s), For therapy of bile duct                            stone(s) Providers:                Clarene Essex, MD, Cleda Daub, RN, Cherylynn Ridges,                            Technician, Karis Juba, CRNA Referring MD:              Medicines:                General Anesthesia Complications:            No immediate complications. Estimated Blood Loss:     Estimated blood loss: none. Procedure:                Pre-Anesthesia Assessment:                           - Prior to the procedure, a History and Physical                            was performed, and patient medications and                            allergies were reviewed. The patient's tolerance of                            previous anesthesia was also reviewed. The risks                            and benefits of the procedure and the sedation                            options and risks were discussed with the patient.                            All questions were answered, and informed consent                            was obtained. Prior Anticoagulants: The patient has                            taken no previous anticoagulant or antiplatelet                            agents. ASA Grade Assessment: III - A patient with                            severe  systemic disease. After reviewing the risks                            and benefits, the patient was deemed in                            satisfactory condition to undergo the procedure.                           After obtaining informed consent, the scope was                            passed under direct vision. Throughout the                            procedure, the patient's blood pressure, pulse, and                             oxygen saturations were monitored continuously. The                            TJF-Q180V ([id number]) Olympus Doudenoscope was                            introduced through the mouth, and used to inject                            contrast into and used to cannulate the bile duct.                            The ERCP was accomplished without difficulty. The                            patient tolerated the procedure well. Scope In: Scope Out: Findings:      The major papilla was normal it was slightly difficult to find behind a       fold but once found deep selective cannulation was readily obtained and       we proceeded with a Biliary sphincterotomy which was made with a       Hydratome sphincterotome using ERBE electrocautery and we proceeded       until we had adequate biliary drainage and could get the fully bowed       sphincterotome easily in and out of the duct. There was no       post-sphincterotomy bleeding. Choledocholithiasis was found in a mildly       dilated duct. The common bile duct contained one stone, which was small       in diameter. The biliary tree was swept with an adjustable 12- 15 mm       balloon starting at the bifurcation. All stones were removed. Nothing       was found at the end of the procedure on subsequent balloon       pull-through's. And on occlusion cholangiogram no residual abnormalities       were seen and there was adequate biliary drainage and the 12 mm balloon  passed readily through the patent sphincterotomy site multiple times but       the 15 did have a moderate amount of resistance so we lowered it to 12       and it readily passed and there was no pancreatic duct injection or wire       advancement throughout the procedure Impression:               - The major papilla appeared normal.                           - Choledocholithiasis was found. Complete removal                            was accomplished by biliary  sphincterotomy and                            balloon extraction.                           - A biliary sphincterotomy was performed.                           - The biliary tree was swept at the end of the                            procedure and nothing was found. There was no                            pancreatic duct injection or wire advancement                            throughout the procedure Moderate Sedation:      Not Applicable - Patient had care per Anesthesia. Recommendation:           - Clear liquid diet for 6 hours. If doing well at 8                            PM may have soft solids                           - Continue present medications.                           - Return to GI clinic in 2- 3 weeks. With Dr. Therisa Doyne                           - Telephone GI clinic if symptomatic PRN. Procedure Code(s):        --- Professional ---                           [phone], Endoscopic retrograde                            cholangiopancreatography (ERCP); with removal of  calculi/debris from biliary/pancreatic duct(s)                           [phone], Endoscopic retrograde                            cholangiopancreatography (ERCP); with                            sphincterotomy/papillotomy Diagnosis Code(s):        --- Professional ---                           K80.50, Calculus of bile duct without cholangitis                            or cholecystitis without obstruction CPT copyright 2019 American Medical Association. All rights reserved. The codes documented in this report are preliminary and upon coder review may  be revised to meet current compliance requirements. Clarene Essex, MD 09/17/2019 2:39:49 PM This report has been signed electronically. Number of Addenda: 0

## 2019-09-17 NOTE — Transfer of Care (Signed)
Immediate Anesthesia Transfer of Care Note  Patient: Kelly Curtis  Procedure(s) Performed: ENDOSCOPIC RETROGRADE CHOLANGIOPANCREATOGRAPHY (ERCP) WITH PROPOFOL (N/A ) SPHINCTEROTOMY REMOVAL OF STONES  Patient Location: PACU and Endoscopy Unit  Anesthesia Type:General  Level of Consciousness: awake, alert , oriented and patient cooperative  Airway & Oxygen Therapy: Patient Spontanous Breathing and Patient connected to face mask oxygen  Post-op Assessment: Report given to RN and Post -op Vital signs reviewed and stable  Post vital signs: Reviewed and stable  Last Vitals:  Vitals Value Taken Time  BP    Temp    Pulse    Resp    SpO2      Last Pain:  Vitals:   09/17/19 1300  TempSrc: Oral  PainSc: 5          Complications: No complications documented.

## 2019-09-17 NOTE — Discharge Instructions (Signed)
YOU HAD AN ENDOSCOPIC PROCEDURE TODAY: Refer to the procedure report and other information in the discharge instructions given to you for any specific questions about what was found during the examination. If this information does not answer your questions, please call [facility] office at [phone] to clarify.   YOU SHOULD EXPECT: Some feelings of bloating in the abdomen. Passage of more gas than usual. Walking can help get rid of the air that was put into your GI tract during the procedure and reduce the bloating. If you had a lower endoscopy (such as a colonoscopy or flexible sigmoidoscopy) you may notice spotting of blood in your stool or on the toilet paper. Some abdominal soreness may be present for a day or two, also.  DIET:  Clear liquid diet until 8 PM and if doing well may have soft solids tonight if not may advance diet tomorrow and call if question or problem or if recurrent abdominal pain otherwise follow-up with Dr. Therisa Doyne in a few weeks to recheck symptoms to make sure no further work-up and plans are needed   ACTIVITY: Your care partner should take you home directly after the procedure. You should plan to take it easy, moving slowly for the rest of the day. You can resume normal activity the day after the procedure however YOU SHOULD NOT DRIVE, use power tools, machinery or perform tasks that involve climbing or major physical exertion for 24 hours (because of the sedation medicines used during the test).   SYMPTOMS TO REPORT IMMEDIATELY: A gastroenterologist can be reached at any hour. Please call [phone]  for any of the following symptoms:   Following upper endoscopy (EGD, EUS, ERCP, esophageal dilation) Vomiting of blood or coffee ground material  New, significant abdominal pain  New, significant chest pain or pain under the shoulder blades  Painful or persistently difficult swallowing  New shortness of breath  Black, tarry-looking or red, bloody stools  FOLLOW UP:  If any  biopsies were taken you will be contacted by phone or by letter within the next 1-3 weeks. Call [phone]  if you have not heard about the biopsies in 3 weeks.  Please also call with any specific questions about appointments or follow up tests.

## 2019-09-17 NOTE — Anesthesia Postprocedure Evaluation (Signed)
Anesthesia Post Note  Patient: Marlenne Ridge  Procedure(s) Performed: ENDOSCOPIC RETROGRADE CHOLANGIOPANCREATOGRAPHY (ERCP) WITH PROPOFOL (N/A ) SPHINCTEROTOMY REMOVAL OF STONES     Patient location during evaluation: Endoscopy Anesthesia Type: General Level of consciousness: awake and alert, oriented and patient cooperative Pain management: pain level controlled Vital Signs Assessment: post-procedure vital signs reviewed and stable Respiratory status: spontaneous breathing, nonlabored ventilation and respiratory function stable Cardiovascular status: blood pressure returned to baseline and stable Postop Assessment: adequate PO intake (nausea improved) Anesthetic complications: no   No complications documented.  Last Vitals:  Vitals:   09/17/19 1520 09/17/19 1530  BP: (!) 148/67 (!) 143/64  Pulse: (!) 58 (!) 56  Resp: 20 13  Temp:  (!) 35.7 C  SpO2: 97% (!) 87%    Last Pain:  Vitals:   09/17/19 1530  TempSrc: Temporal  PainSc: 0-No pain                 Vu Liebman,E. Takima Encina

## 2019-09-17 NOTE — Progress Notes (Signed)
Kelly Curtis 1:21 PM  Subjective: Patient seen and examined and discussed with Dr. Therisa Doyne she had no problems with her previous ERCP attempt except for her lip which we discussed and she has not had any GI issues recently has no new complaints  Objective: Vital signs stable afebrile no acute distress exam please see preassessment evaluation  Assessment: CBD stone  Plan: Okay to retry ERCP with anesthesia assistance  [facility]  office [phone] After 5PM or if no answer call [phone]

## 2019-09-18 ENCOUNTER — Encounter (HOSPITAL_COMMUNITY): Payer: Self-pay | Admitting: Gastroenterology

## 2019-09-23 DIAGNOSIS — I129 Hypertensive chronic kidney disease with stage 1 through stage 4 chronic kidney disease, or unspecified chronic kidney disease: Secondary | ICD-10-CM | POA: Diagnosis not present

## 2019-09-23 DIAGNOSIS — N1831 Chronic kidney disease, stage 3a: Secondary | ICD-10-CM | POA: Diagnosis not present

## 2019-09-23 DIAGNOSIS — F325 Major depressive disorder, single episode, in full remission: Secondary | ICD-10-CM | POA: Diagnosis not present

## 2019-10-09 DIAGNOSIS — K828 Other specified diseases of gallbladder: Secondary | ICD-10-CM | POA: Diagnosis not present

## 2019-10-22 DIAGNOSIS — N1831 Chronic kidney disease, stage 3a: Secondary | ICD-10-CM | POA: Diagnosis not present

## 2019-10-22 DIAGNOSIS — I129 Hypertensive chronic kidney disease with stage 1 through stage 4 chronic kidney disease, or unspecified chronic kidney disease: Secondary | ICD-10-CM | POA: Diagnosis not present

## 2019-10-22 DIAGNOSIS — F325 Major depressive disorder, single episode, in full remission: Secondary | ICD-10-CM | POA: Diagnosis not present

## 2019-10-24 ENCOUNTER — Ambulatory Visit: Payer: Self-pay | Admitting: General Surgery

## 2019-10-24 DIAGNOSIS — K8050 Calculus of bile duct without cholangitis or cholecystitis without obstruction: Secondary | ICD-10-CM | POA: Diagnosis not present

## 2019-10-24 NOTE — H&P (Signed)
Kelly Curtis Appointment: 10/24/2019 10:20 AM Location: [facility] Patient #: [id number] DOB: 25-Jun-1945 Divorced / Language: Kelly Curtis / Race: White Female   History of Present Illness Kelly Ok MD; 10/24/2019 10:29 AM) The patient is a 74 year old female who presents for evaluation of gall stones. Referred by: Dr. Therisa Doyne Chief Complaint: Gallstones  Patient is a 74 year old female, with a history of mitral valve replacement, who comes in secondary to a history of common bile duct stones. Patient recently underwent ERCP secondary to common duct stones. She did have a sphincterotomy. Patient states that pain began approximately 2-3 months ago patient was in the right upper quadrant and radiated to the back. States this is usually after dinner. She states that this continues is referred to GI. Patient underwent above-stated endoscopy.  Patient states that she continues with some discomfort on and off.  She had a previous oophorectomy and mitral valve replacement.      Past Surgical History [name], [place]; 10/24/2019 10:10 AM) Breast Augmentation  Bilateral. Cataract Surgery  Bilateral. Colon Polyp Removal - Colonoscopy  Hysterectomy (not due to cancer) - Complete  Oral Surgery  Shoulder Surgery  Right. Valve Replacement   Allergies [name], [place]; 10/24/2019 10:13 AM) PCN-50 *MISCELLANEOUS THERAPEUTIC CLASSES*  Vancomycin HCl *CHEMICALS*  Allergies Reconciled   Medication History [name], RMA; 10/24/2019 10:16 AM) buPROPion HCl ER (XL) (150MG  Tablet ER 24HR, Oral) Active. Omeprazole (20MG  Capsule DR, Oral) Active. Zolpidem Tartrate (10MG  Tablet, Oral) Active. tiZANidine HCl (4MG  Tablet, Oral) Active. PARoxetine HCl (10MG  Tablet, Oral) Active. Naproxen (500MG  Tablet, Oral) Active. DULoxetine HCl (40MG  Capsule DR Part, Oral) Active. HYDROcodone-Acetaminophen (5-325MG  Tablet, Oral) Active. Methadone HCl (10MG  Tablet, Oral)  Active. Metoprolol Succinate ER (25MG  Tablet ER 24HR, Oral) Active.  Social History [name], [place]; 10/24/2019 10:10 AM) Alcohol use  Remotely quit alcohol use. No caffeine use  No drug use  Tobacco use  Former smoker.  Family History [name], [place]; 10/24/2019 10:10 AM) Arthritis  Brother, Father, Mother, Sister. Cervical Cancer  Sister. Depression  Mother, Sister. Heart Disease  Mother. Hypertension  Mother. Melanoma  Family Members In General, Father, Sister. Migraine Headache  Sister. Thyroid problems  Sister.  Other Problems [name], [place]; 10/24/2019 10:10 AM) No pertinent past medical history  Oophorectomy     Review of Systems Kelly Ok MD; 10/24/2019 10:27 AM) General Present- Night Sweats. Not Present- Appetite Loss, Chills, Fatigue, Fever, Weight Gain and Weight Loss. Skin Present- Dryness and Rash. Not Present- Change in Wart/Mole, Hives, Jaundice, New Lesions, Non-Healing Wounds and Ulcer. HEENT Present- Seasonal Allergies and Wears glasses/contact lenses. Not Present- Earache, Hearing Loss, Hoarseness, Nose Bleed, Oral Ulcers, Ringing in the Ears, Sinus Pain, Sore Throat, Visual Disturbances and Yellow Eyes. Respiratory Not Present- Bloody sputum, Chronic Cough, Difficulty Breathing, Snoring and Wheezing. Breast Not Present- Breast Mass, Breast Pain, Nipple Discharge and Skin Changes. Cardiovascular Present- Swelling of Extremities. Not Present- Chest Pain, Difficulty Breathing Lying Down, Leg Cramps, Palpitations, Rapid Heart Rate and Shortness of Breath. Gastrointestinal Present- Indigestion. Not Present- Abdominal Pain, Bloating, Bloody Stool, Change in Bowel Habits, Chronic diarrhea, Constipation, Difficulty Swallowing, Excessive gas, Gets full quickly at meals, Hemorrhoids, Nausea, Rectal Pain and Vomiting. Female Genitourinary Not Present- Frequency, Nocturia, Painful Urination, Pelvic Pain and Urgency. Musculoskeletal Present-  Back Pain and Swelling of Extremities. Not Present- Joint Pain, Joint Stiffness, Muscle Pain and Muscle Weakness. Neurological Present- Trouble walking. Not Present- Decreased Memory, Fainting, Headaches, Numbness, Seizures, Tingling, Tremor and Weakness. Psychiatric Present-  Depression. Not Present- Anxiety, Bipolar, Change in Sleep Pattern, Fearful and Frequent crying. Endocrine Present- Cold Intolerance, Heat Intolerance and Hot flashes. Not Present- Excessive Hunger, Hair Changes and New Diabetes. Hematology Present- Easy Bruising. Not Present- Blood Thinners, Excessive bleeding, Gland problems, HIV and Persistent Infections. All other systems negative  Vitals [name] RMA; 10/24/2019 10:17 AM) 10/24/2019 10:17 AM Weight: 154.13 lb Height: 64in Body Surface Area: 1.75 m Body Mass Index: 26.46 kg/m  Temp.: 97.78F  Pulse: 63 (Regular)  P.OX: 99% (Room air) BP: 128/88(Sitting, Left Arm, Standard)       Physical Exam Kelly Ok MD; 10/24/2019 10:29 AM) The physical exam findings are as follows: Note: Constitutional: No acute distress, conversant, appears stated age  Eyes: Anicteric sclerae, moist conjunctiva, no lid lag  Neck: No thyromegaly, trachea midline, no cervical lymphadenopathy  Lungs: Clear to auscultation biilaterally, normal respiratory effot  Cardiovascular: regular rate & rhythm, no murmurs, no peripheal edema, pedal pulses 2+  GI: Soft, no masses or hepatosplenomegaly, non-tender to palpation  MSK: Normal gait, no clubbing cyanosis, edema  Skin: No rashes, palpation reveals normal skin turgor  Psychiatric: Appropriate judgment and insight, oriented to person, place, and time    Assessment & Plan Kelly Ok MD; 10/24/2019 10:30 AM) [name] (K80.50) Impression: Patient is a 74 year old female with a history of choledocholithiasis likely cholelithiasis.  1. We will proceed to the operating room for a laparoscopic  cholecystectomy  2. Risks and benefits were discussed with the patient to generally include, but not limited to: infection, bleeding, possible need for post op ERCP, damage to the bile ducts, bile leak, and possible need for further surgery. Alternatives were offered and described. All questions were answered and the patient voiced understanding of the procedure and wishes to proceed at this point with a laparoscopic cholecystectomy  I reviewed the patient's external notes from the referring physicians as well as consulting physician team. Each of the radiologic studies and lab studies were independently reviewed and interpreted. I discussed the results of the above studies and how they relate to the patient's surgical problems. Current Plans

## 2019-10-29 DIAGNOSIS — Z79899 Other long term (current) drug therapy: Secondary | ICD-10-CM | POA: Diagnosis not present

## 2019-10-29 DIAGNOSIS — I7381 Erythromelalgia: Secondary | ICD-10-CM | POA: Diagnosis not present

## 2019-10-29 DIAGNOSIS — G894 Chronic pain syndrome: Secondary | ICD-10-CM | POA: Diagnosis not present

## 2019-10-29 DIAGNOSIS — M792 Neuralgia and neuritis, unspecified: Secondary | ICD-10-CM | POA: Diagnosis not present

## 2019-10-29 DIAGNOSIS — Z5181 Encounter for therapeutic drug level monitoring: Secondary | ICD-10-CM | POA: Diagnosis not present

## 2019-11-22 DIAGNOSIS — N1831 Chronic kidney disease, stage 3a: Secondary | ICD-10-CM | POA: Diagnosis not present

## 2019-11-22 DIAGNOSIS — G4700 Insomnia, unspecified: Secondary | ICD-10-CM | POA: Diagnosis not present

## 2019-11-22 DIAGNOSIS — D508 Other iron deficiency anemias: Secondary | ICD-10-CM | POA: Diagnosis not present

## 2019-11-22 DIAGNOSIS — F325 Major depressive disorder, single episode, in full remission: Secondary | ICD-10-CM | POA: Diagnosis not present

## 2019-11-22 DIAGNOSIS — I129 Hypertensive chronic kidney disease with stage 1 through stage 4 chronic kidney disease, or unspecified chronic kidney disease: Secondary | ICD-10-CM | POA: Diagnosis not present

## 2019-11-26 DIAGNOSIS — N1831 Chronic kidney disease, stage 3a: Secondary | ICD-10-CM | POA: Diagnosis not present

## 2019-11-26 DIAGNOSIS — I5189 Other ill-defined heart diseases: Secondary | ICD-10-CM | POA: Diagnosis not present

## 2019-11-26 DIAGNOSIS — I129 Hypertensive chronic kidney disease with stage 1 through stage 4 chronic kidney disease, or unspecified chronic kidney disease: Secondary | ICD-10-CM | POA: Diagnosis not present

## 2019-11-26 DIAGNOSIS — H8113 Benign paroxysmal vertigo, bilateral: Secondary | ICD-10-CM | POA: Diagnosis not present

## 2019-11-26 DIAGNOSIS — I7381 Erythromelalgia: Secondary | ICD-10-CM | POA: Diagnosis not present

## 2019-11-26 DIAGNOSIS — D508 Other iron deficiency anemias: Secondary | ICD-10-CM | POA: Diagnosis not present

## 2019-12-04 NOTE — Progress Notes (Signed)
Your procedure is scheduled on Thursday, September 16th.  Report to [facility] Main Entrance "A" at 10:30 A.M., and check in at the Admitting office.  Call this number if you have problems the morning of surgery:  [phone]  Call [phone] if you have any questions prior to your surgery date Monday-Friday 8am-4pm   Remember:  Do not eat after midnight the night before your surgery  You may drink clear liquids until 9:30 A.M the morning of your surgery.   Clear liquids allowed are: Water, Non-Citrus Juices (without pulp), Carbonated Beverages, Clear Tea, Black Coffee Only, and Gatorade   Please complete your PRE-SURGERY ENSURE that was provided to you by 9:30 A.M. the morning of surgery.  Please, if able, drink it in one setting. DO NOT SIP.   Take these medicines the morning of surgery with A SIP OF WATER  buPROPion (WELLBUTRIN XL) cetirizine (ZYRTEC)  DULoxetine  fluticasone (FLONASE)/nasal spray  methadone (DOLOPHINE) metoprolol succinate (TOPROL-XL)  omeprazole (PRILOSEC)  IF NEEDED: acetaminophen (TYLENOL), dimenhyDRINATE (DRAMAMINE), HYDROcodone-acetaminophen (NORCO/VICODIN), polyvinyl alcohol (ARTIFICIAL TEARS)/eye drops, tiZANidine (ZANAFLEX)  Follow your surgeon's instructions on when to stop Aspirin.  If no instructions were given by your surgeon then you will need to call the office to get those instructions.    As of today, STOP taking Aleve, Naproxen, Ibuprofen, Motrin, Advil, Goody's, BC's, all herbal medications, fish oil, and all vitamins.             Do not wear jewelry, make up, or nail polish            Do not wear lotions, powders, perfumes, or deodorant.            Do not shave 48 hours prior to surgery.              Do not bring valuables to the hospital.            [facility] is not responsible for any belongings or valuables.  Do NOT Smoke (Tobacco/Vaping) or drink Alcohol 24 hours prior to your procedure If you use a CPAP at night, you may bring  all equipment for your overnight stay.   Contacts, glasses, dentures or bridgework may not be worn into surgery.      For patients admitted to the hospital, discharge time will be determined by your treatment team.   Patients discharged the day of surgery will not be allowed to drive home, and someone needs to stay with them for 24 hours  Special instructions:   [facility]- Preparing For Surgery  Before surgery, you can play an important role. Because skin is not sterile, your skin needs to be as free of germs as possible. You can reduce the number of germs on your skin by washing with CHG (chlorahexidine gluconate) Soap before surgery.  CHG is an antiseptic cleaner which kills germs and bonds with the skin to continue killing germs even after washing.    Oral Hygiene is also important to reduce your risk of infection.  Remember - BRUSH YOUR TEETH THE MORNING OF SURGERY WITH YOUR REGULAR TOOTHPASTE  Please do not use if you have an allergy to CHG or antibacterial soaps. If your skin becomes reddened/irritated stop using the CHG.  Do not shave (including legs and underarms) for at least 48 hours prior to first CHG shower. It is OK to shave your face.  Please follow these instructions carefully.   1. Shower the NIGHT BEFORE SURGERY and the MORNING OF SURGERY with  CHG Soap.   2. If you chose to wash your hair, wash your hair first as usual with your normal shampoo.  3. After you shampoo, rinse your hair and body thoroughly to remove the shampoo.  4. Use CHG as you would any other liquid soap. You can apply CHG directly to the skin and wash gently with a scrungie or a clean washcloth.   5. Apply the CHG Soap to your body ONLY FROM THE NECK DOWN.  Do not use on open wounds or open sores. Avoid contact with your eyes, ears, mouth and genitals (private parts). Wash Face and genitals (private parts)  with your normal soap.   6. Wash thoroughly, paying special attention to the area where your  surgery will be performed.  7. Thoroughly rinse your body with warm water from the neck down.  8. DO NOT shower/wash with your normal soap after using and rinsing off the CHG Soap.  9. Pat yourself dry with a CLEAN TOWEL.  10. Wear CLEAN PAJAMAS to bed the night before surgery  11. Place CLEAN SHEETS on your bed the night of your first shower and DO NOT SLEEP WITH PETS.  Day of Surgery: Wear Clean/Comfortable clothing the morning of surgery Do not apply any deodorants/lotions.   Remember to brush your teeth WITH YOUR REGULAR TOOTHPASTE.   Please read over the following fact sheets that you were given.

## 2019-12-05 ENCOUNTER — Inpatient Hospital Stay (HOSPITAL_COMMUNITY)
Admission: RE | Admit: 2019-12-05 | Discharge: 2019-12-05 | Disposition: A | Discharge status: Home / Self Care | Payer: Medicare Other | Source: Ambulatory Visit

## 2019-12-09 ENCOUNTER — Other Ambulatory Visit (HOSPITAL_COMMUNITY)
Admission: RE | Admit: 2019-12-09 | Discharge: 2019-12-09 | Disposition: A | Discharge status: Home / Self Care | Payer: Medicare Other | Source: Ambulatory Visit | Attending: General Surgery | Admitting: General Surgery

## 2019-12-09 DIAGNOSIS — Z20822 Contact with and (suspected) exposure to covid-19: Secondary | ICD-10-CM | POA: Diagnosis not present

## 2019-12-09 DIAGNOSIS — Z01812 Encounter for preprocedural laboratory examination: Secondary | ICD-10-CM | POA: Insufficient documentation

## 2019-12-09 LAB — SARS CORONAVIRUS 2 (TAT 6-24 HRS): SARS Coronavirus 2: NEGATIVE

## 2019-12-10 ENCOUNTER — Encounter (HOSPITAL_COMMUNITY): Payer: Self-pay | Admitting: General Surgery

## 2019-12-10 ENCOUNTER — Other Ambulatory Visit: Payer: Self-pay

## 2019-12-10 NOTE — Progress Notes (Addendum)
Ms Kelly Curtis denies chest pain or shortness of breath. Patient tested negative for Covid_9/13_ and has been in quarantine since that time. Ms Kelly Curtis has had a couple falls recently, "due to hypotension". The falls did not require treatment or Dr. Visit.  Ms Kelly Curtis reports that she uses a cane or walker- knees gave way.  Dr. Felipa Eth is patient's PCP.  I requested last office visit , labs, EKG. Ms Kelly Curtis is seen by [facility] in [place], patient has a pain pump. I instructed Ms Kelly Curtis to stop Naproxen and vitamins until after surgery. I am asking anesthesiology  PA- C to review chart.

## 2019-12-11 ENCOUNTER — Encounter (HOSPITAL_COMMUNITY): Payer: Self-pay | Admitting: General Surgery

## 2019-12-11 NOTE — Anesthesia Preprocedure Evaluation (Addendum)
Anesthesia Evaluation  Patient identified by MRN, date of birth, ID band Patient awake    Reviewed: Allergy & Precautions, NPO status , Patient's Chart, lab work & pertinent test results, reviewed documented beta blocker date and time   History of Anesthesia Complications Negative for: history of anesthetic complications  Airway Mallampati: I  TM Distance: >3 FB Neck ROM: Full    Dental  (+) Teeth Intact, Dental Advisory Given   Pulmonary former smoker,  12/09/2019 SARS coronavirus NEG   breath sounds clear to auscultation       Cardiovascular hypertension (pt has had low BP and is now off of her BP meds), Pt. on medications and Pt. on home beta blockers + Peripheral Vascular Disease and + DVT  + dysrhythmias Atrial Fibrillation + Valvular Problems/Murmurs MR  Rhythm:Regular Rate:Normal  '19 ECHO: EF 60-65%, mod MR, mod MS with mean grad 8 mmHg  2000 MVR/annuplasty ring at [facility]   Neuro/Psych  Headaches, Anxiety Depression Chronic pain, has intrathecal pump and spinal cord stimulator (stimulator is no longer functional)    GI/Hepatic GERD  Medicated,(+)     substance abuse (methadone)  , Nausea with cholelithiasis   Endo/Other  negative endocrine ROS  Renal/GU negative Renal ROS     Musculoskeletal  (+) Arthritis ,   Abdominal   Peds  Hematology negative hematology ROS (+)   Anesthesia Other Findings   Reproductive/Obstetrics                          Anesthesia Physical Anesthesia Plan  ASA: III  Anesthesia Plan: General   Post-op Pain Management:    Induction: Intravenous  PONV Risk Score and Plan: 3 and Ondansetron, Dexamethasone and Droperidol  Airway Management Planned: Oral ETT  Additional Equipment: None  Intra-op Plan:   Post-operative Plan: Extubation in OR  Informed Consent: I have reviewed the patients History and Physical, chart, labs and discussed the procedure  including the risks, benefits and alternatives for the proposed anesthesia with the patient or authorized representative who has indicated his/her understanding and acceptance.     Dental advisory given  Plan Discussed with: CRNA and Surgeon  Anesthesia Plan Comments: (PAT note written 12/11/2019 by Myra Gianotti, PA-C. 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 Pain management is through [facility]. S/p intrathecal pump refill on 10/29/19. The pump was "programmed to a simple continuous infusion rate of Clonidine 110.54 mg per day and Gabapentin 22.107 mg per day". She is also utilizing methadone and Norco.   )     Anesthesia Quick Evaluation

## 2019-12-11 NOTE — Progress Notes (Signed)
Anesthesia Chart Review: Kelly Curtis   Case: [id number] Date/Time: 12/12/19 0835   Procedure: LAPAROSCOPIC CHOLECYSTECTOMY (N/A )   Anesthesia type: General   Pre-op diagnosis: GALLSTONES   Location: [facility] OR ROOM 11 / [place] [facility]   Surgeons: [name], MD      DISCUSSION: Patient is a 74 year old female scheduled for the above procedure. She is a same day work-up, as she did not show for her 12/05/19 PAT visit.   History includes former smoker, erythromelalgia, mitral regurgitation (s/p mitral valve repair with annuloplasty ring ~ 2000) for severe MR, [facility]), dysrhythmia (PVCs; PAF around time of MV repair, no recurrence), CKD (Stage 3a), hypotension, GERD, mitral valve repair (annuloplasty ring ~ 2000, post operative PAF, resolved), skin cancer, spinal cord stimulator (~2011; cervical and lumbar spinal cord stimulator replacements ~ 2015), intrathecal pump implant (11/09/15), DVT (age 66's). By notes, she is a retired Marine scientist.  - 3 mm stone noted in CBD on EUS 07/31/19, CBD 8 mm caliber. S/p attempted ERCP (unable to locate ampulla) 09/06/19. S/p ERCP/biliary sphinceterotomy, CBD stone extraction 09/17/19. She continued to have some abdominal discomfort and was referred to general surgery.   Last primary care visit was on 11/26/19 with [name], MD. She was referred to [facility] maneuvers due to BPPV. HTN had been controlled, but now having some hypotension, so hydration encouraged. Creatine was 1.59, which was a little above her baseline, so he advised limiting Lasix based on weight gain/symptoms and rechecking labs in ~ 3 weeks. Her diastolic dysfunction was felt "compensated". He documented "no murmur" on exam. She denied chest pain and SOB per PAT RN phone interview.  Last [facility] cardiology visit seen was in 2017. Only mild MR on 2016 echo. She was admitted to [facility] in 02/2018 for edema, new anemia (HGB ~ 12 -> 7.7). BNP 277. Troponin negative.  Echo showed normal LVEF, MV annuloplasty ring  with transvalvular velocity increased more than expected, due to high cardiac output, and consistent with mild to moderate stenosis. There was also mild to moderate regurgitation directed centrally. Gradients were felt to be likely exaggerated due to anemia, so consider follow-up echo once anemia improved. [facility] labs on 11/26/19 show HGB 11.7.). She was discharged home after Lasix and IV iron.   Pain management is through [facility]). 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  She has a intrathecal pump". S/p pump refill (clonidine and gabapentin) on 10/29/19. The pump was "programmed to a simple continuous infusion rate of Clonidine 110.54 mg per day and Gabapentin 22.107 mg per day" which was not a change from prior dosing. She is also utilizing methadone and Norco.   2nd [place] COVID-19 vaccine 07/16/19. 12/09/19 COVID-19 test negative. Reviewed above with anesthesiologist Myrtie Soman, MD. She had recent primary care evaluation and underwent GETA for ERCP on 09/17/19. She is a same day work-up, so anesthesia team to evaluate on the day of surgery.    VS: Ht _0  (1.626 m)   Wt 68 kg   BMI 25.75 kg/m   BP Readings from Last 3 Encounters:  09/17/19 (!) 143/64  09/06/19 136/69  03/24/18 131/62   Pulse Readings from Last 3 Encounters:  09/17/19 (!) 56  09/06/19 (!) 59  03/24/18 65    PROVIDERS: [name], MD is PCP  - Debarah Crape, MD is Pain Management ([facility] Everywhere) - Jarome Matin, MD is dermatologist. Also evaluated  by Spero Curb, MD 07/16/19 via telemedicine ([place]).   Ronnette Juniper, MD is GI - Last cardiologist visit seen is from 12/24/15 with Geralyn Corwin, MD with [facility] (Care Everywhere)   LABS: Labs as of 11/26/19 from [facility] included: Glucose 102, creatinine 1.59 eGFR 32, potassium 4.9, BUN 51, WBC  7.8, hemoglobin 11.7, hematocrit 36.3, platelet count 318. On 06/21/19, ALP 134, AST 16, ALT 15, total bili < 0.2.    EKG: Last EKG noted is from 03/23/18 and showed SR.    CV: Echo 03/24/18 (in setting of admission for worsening BLE edema/acute on chronic diastolic CHF & anemia with HGB 7.7, down from ~ 12. Treated with Lasix and IV iron, out-patient follow-up) Study Conclusions  - Left ventricle: The cavity size was normal. Wall thickness was  normal. Systolic function was normal. The estimated ejection  fraction was in the range of 60% to 65%. Wall motion was normal;  there were no regional wall motion abnormalities. The study is  not technically sufficient to allow evaluation of LV diastolic  function.  - Ventricular septum: Septal motion showed paradox. These changes  are consistent with a post-thoracotomy state.  - Mitral valve: Prior procedures included surgical repair. An  annular ring prosthesis was present. Transvalvular velocity was  increased more than expected, due to high cardiac output. The  findings are consistent with mild to moderate stenosis. There was  mild to moderate regurgitation directed centrally. Mean gradient  (D): 8 mm Hg. Gradients are measured at a heart rate of 93 bpm  and are likely exaggerated due to anemia. Valve area by  continuity equation (using LVOT flow): 1.13 cm^2.  Impressions:  - No old study available for comparison. Consider repeat outpatient  evaluation when hemoglobin is normal.  - (Comparison 09/17/14 echo in [place]: normal biventricular systolic function, mild diastolic function, prior MV repair with mild MR)   Cardiac Event Monitor 03/18/16 ([facility]): Per Care Everywhere "Babette Relic, MD - 03/18/2016 9:21 AM EST  No AFIB detected. No significant pauses seen".    Past Medical History:  Diagnosis Date  . Arthritis   . Cancer ([place])    skin cancer x 3  . Chronic kidney  disease   . Constipation due to pain medication   . Depression with anxiety   . DVT (deep venous thrombosis) [facility])    age 53ish  . Dysrhythmia    PVCs; perioperative PAF with MV repair ~ 2000   . 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 Erythromelalgia ([place])   . GERD (gastroesophageal reflux disease)   . Headache    migraine heads- 4 times a year  . History of blood transfusion   . Hypotension   . 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 AUGMENTATION MAMMAPLASTY Bilateral   . COLONOSCOPY    . ENDOSCOPIC RETROGRADE CHOLANGIOPANCREATOGRAPHY (ERCP) WITH PROPOFOL N/A 09/17/2019   Procedure: ENDOSCOPIC RETROGRADE CHOLANGIOPANCREATOGRAPHY (ERCP) WITH PROPOFOL;  Surgeon: Clarene Essex, MD;  Location: [facility] ENDOSCOPY;  Service: Endoscopy;  Laterality: N/A;  . ESOPHAGOGASTRODUODENOSCOPY (EGD) WITH PROPOFOL N/A 09/06/2019   Procedure: ESOPHAGOGASTRODUODENOSCOPY (EGD) WITH PROPOFOL;  Surgeon: Ronnette Juniper, MD;  Location: [facility] ENDOSCOPY;  Service: Gastroenterology;  Laterality: N/A;  . EYE SURGERY Bilateral    cataract  . MITRAL VALVE REPAIR    . REMOVAL OF STONES  09/17/2019   Procedure: REMOVAL OF  STONES;  Surgeon: Clarene Essex, MD;  Location: [facility];  Service: Endoscopy;;  . [name]  09/17/2019   Procedure: [name];  Surgeon: Clarene Essex, MD;  Location: [facility] ENDOSCOPY;  Service: Endoscopy;;  . TOTAL SHOULDER ARTHROPLASTY Right 2013    MEDICATIONS: No current facility-administered medications for this encounter.   Marland Kitchen acetaminophen (TYLENOL) 500 MG tablet  . aspirin EC 81 MG tablet  . buPROPion (WELLBUTRIN XL) 150 MG 24 hr tablet  . cetirizine (ZYRTEC) 10 MG tablet  . dimenhyDRINATE (DRAMAMINE) 50 MG tablet  . DULoxetine HCl 40 MG CPEP  . ferrous gluconate (FERGON) 240 (27 FE) MG tablet  . fluticasone (FLONASE) 50 MCG/ACT nasal spray  . furosemide (LASIX) 20 MG tablet  . HYDROcodone-acetaminophen  (NORCO/VICODIN) 5-325 MG tablet  . methadone (DOLOPHINE) 10 MG tablet  . metoprolol succinate (TOPROL-XL) 25 MG 24 hr tablet  . naloxone (NARCAN) nasal spray 4 mg/0.1 mL  . naproxen (NAPROSYN) 500 MG tablet  . omeprazole (PRILOSEC) 20 MG capsule  . PARoxetine (PAXIL) 10 MG tablet  . Pediatric Multivitamins-Iron (FLINTSTONES PLUS IRON PO)  . polyethylene glycol (MIRALAX / GLYCOLAX) 17 g packet  . polyvinyl alcohol (ARTIFICIAL TEARS) 1.4 % ophthalmic solution  . PRESCRIPTION MEDICATION  . tiZANidine (ZANAFLEX) 4 MG tablet  . zolpidem (AMBIEN) 10 MG tablet  . diphenhydrAMINE (BENADRYL) 25 MG tablet  . magnesium oxide (MAG-OX) 400 MG tablet    Myra Gianotti, PA-C Surgical Short Stay/Anesthesiology [facility] Phone [phone] [facility] Phone [phone] 12/11/2019 2:28 PM

## 2019-12-12 ENCOUNTER — Encounter (HOSPITAL_COMMUNITY): Payer: Self-pay | Admitting: General Surgery

## 2019-12-12 ENCOUNTER — Ambulatory Visit (HOSPITAL_COMMUNITY)
Admission: RE | Admit: 2019-12-12 | Discharge: 2019-12-12 | Disposition: A | Discharge status: Home / Self Care | Payer: Medicare Other | Attending: General Surgery | Admitting: General Surgery

## 2019-12-12 ENCOUNTER — Other Ambulatory Visit: Payer: Self-pay

## 2019-12-12 ENCOUNTER — Ambulatory Visit (HOSPITAL_COMMUNITY): Payer: Medicare Other | Admitting: Vascular Surgery

## 2019-12-12 ENCOUNTER — Encounter (HOSPITAL_COMMUNITY)
Admission: RE | Disposition: A | Discharge status: Home / Self Care | Payer: Self-pay | Source: Home / Self Care | Attending: General Surgery

## 2019-12-12 DIAGNOSIS — I129 Hypertensive chronic kidney disease with stage 1 through stage 4 chronic kidney disease, or unspecified chronic kidney disease: Secondary | ICD-10-CM | POA: Diagnosis not present

## 2019-12-12 DIAGNOSIS — G8929 Other chronic pain: Secondary | ICD-10-CM | POA: Diagnosis not present

## 2019-12-12 DIAGNOSIS — Z87891 Personal history of nicotine dependence: Secondary | ICD-10-CM | POA: Insufficient documentation

## 2019-12-12 DIAGNOSIS — F329 Major depressive disorder, single episode, unspecified: Secondary | ICD-10-CM | POA: Insufficient documentation

## 2019-12-12 DIAGNOSIS — I739 Peripheral vascular disease, unspecified: Secondary | ICD-10-CM | POA: Diagnosis not present

## 2019-12-12 DIAGNOSIS — Z791 Long term (current) use of non-steroidal anti-inflammatories (NSAID): Secondary | ICD-10-CM | POA: Diagnosis not present

## 2019-12-12 DIAGNOSIS — N189 Chronic kidney disease, unspecified: Secondary | ICD-10-CM | POA: Diagnosis not present

## 2019-12-12 DIAGNOSIS — Z7982 Long term (current) use of aspirin: Secondary | ICD-10-CM | POA: Insufficient documentation

## 2019-12-12 DIAGNOSIS — Z79899 Other long term (current) drug therapy: Secondary | ICD-10-CM | POA: Insufficient documentation

## 2019-12-12 DIAGNOSIS — Z8261 Family history of arthritis: Secondary | ICD-10-CM | POA: Diagnosis not present

## 2019-12-12 DIAGNOSIS — Z79891 Long term (current) use of opiate analgesic: Secondary | ICD-10-CM | POA: Insufficient documentation

## 2019-12-12 DIAGNOSIS — I5033 Acute on chronic diastolic (congestive) heart failure: Secondary | ICD-10-CM | POA: Diagnosis not present

## 2019-12-12 DIAGNOSIS — K8010 Calculus of gallbladder with chronic cholecystitis without obstruction: Secondary | ICD-10-CM | POA: Insufficient documentation

## 2019-12-12 DIAGNOSIS — N1831 Chronic kidney disease, stage 3a: Secondary | ICD-10-CM | POA: Diagnosis not present

## 2019-12-12 DIAGNOSIS — K219 Gastro-esophageal reflux disease without esophagitis: Secondary | ICD-10-CM | POA: Insufficient documentation

## 2019-12-12 DIAGNOSIS — Z8249 Family history of ischemic heart disease and other diseases of the circulatory system: Secondary | ICD-10-CM | POA: Insufficient documentation

## 2019-12-12 DIAGNOSIS — F419 Anxiety disorder, unspecified: Secondary | ICD-10-CM | POA: Diagnosis not present

## 2019-12-12 DIAGNOSIS — Z952 Presence of prosthetic heart valve: Secondary | ICD-10-CM | POA: Diagnosis not present

## 2019-12-12 DIAGNOSIS — Z8719 Personal history of other diseases of the digestive system: Secondary | ICD-10-CM | POA: Diagnosis not present

## 2019-12-12 DIAGNOSIS — I480 Paroxysmal atrial fibrillation: Secondary | ICD-10-CM | POA: Insufficient documentation

## 2019-12-12 DIAGNOSIS — I130 Hypertensive heart and chronic kidney disease with heart failure and stage 1 through stage 4 chronic kidney disease, or unspecified chronic kidney disease: Secondary | ICD-10-CM | POA: Diagnosis not present

## 2019-12-12 DIAGNOSIS — Z85828 Personal history of other malignant neoplasm of skin: Secondary | ICD-10-CM | POA: Insufficient documentation

## 2019-12-12 DIAGNOSIS — K8080 Other cholelithiasis without obstruction: Secondary | ICD-10-CM | POA: Diagnosis present

## 2019-12-12 DIAGNOSIS — M1990 Unspecified osteoarthritis, unspecified site: Secondary | ICD-10-CM | POA: Diagnosis not present

## 2019-12-12 DIAGNOSIS — Z86718 Personal history of other venous thrombosis and embolism: Secondary | ICD-10-CM | POA: Diagnosis not present

## 2019-12-12 HISTORY — DX: Malignant (primary) neoplasm, unspecified: C80.1

## 2019-12-12 HISTORY — DX: Hypotension, unspecified: I95.9

## 2019-12-12 HISTORY — PX: CHOLECYSTECTOMY: SHX55

## 2019-12-12 HISTORY — DX: Drug induced constipation: K59.03

## 2019-12-12 HISTORY — DX: Chronic kidney disease, unspecified: N18.9

## 2019-12-12 HISTORY — DX: Headache, unspecified: R51.9

## 2019-12-12 HISTORY — DX: Acute embolism and thrombosis of unspecified deep veins of unspecified lower extremity: I82.409

## 2019-12-12 HISTORY — DX: Rheumatic mitral valve disease, unspecified: I05.9

## 2019-12-12 HISTORY — DX: Personal history of other medical treatment: Z92.89

## 2019-12-12 HISTORY — DX: Cardiac arrhythmia, unspecified: I49.9

## 2019-12-12 HISTORY — DX: Unspecified osteoarthritis, unspecified site: M19.90

## 2019-12-12 HISTORY — DX: Erythromelalgia: I73.81

## 2019-12-12 LAB — CBC
HCT: 35.6 % — ABNORMAL LOW (ref 36.0–46.0)
Hemoglobin: 10.2 g/dL — ABNORMAL LOW (ref 12.0–15.0)
MCH: 25.4 pg — ABNORMAL LOW (ref 26.0–34.0)
MCHC: 28.7 g/dL — ABNORMAL LOW (ref 30.0–36.0)
MCV: 88.8 fL (ref 80.0–100.0)
Platelets: 242 10*3/uL (ref 150–400)
RBC: 4.01 MIL/uL (ref 3.87–5.11)
RDW: 15.8 % — ABNORMAL HIGH (ref 11.5–15.5)
WBC: 10.6 10*3/uL — ABNORMAL HIGH (ref 4.0–10.5)
nRBC: 0 % (ref 0.0–0.2)

## 2019-12-12 LAB — BASIC METABOLIC PANEL
Anion gap: 9 (ref 5–15)
BUN: 29 mg/dL — ABNORMAL HIGH (ref 8–23)
CO2: 26 mmol/L (ref 22–32)
Calcium: 9.3 mg/dL (ref 8.9–10.3)
Chloride: 104 mmol/L (ref 98–111)
Creatinine, Ser: 0.95 mg/dL (ref 0.44–1.00)
GFR calc Af Amer: >60 mL/min (ref >60)
GFR calc non Af Amer: 60 mL/min — ABNORMAL LOW (ref >60)
Glucose, Bld: 97 mg/dL (ref 70–99)
Potassium: 4.2 mmol/L (ref 3.5–5.1)
Sodium: 139 mmol/L (ref 135–145)

## 2019-12-12 SURGERY — LAPAROSCOPIC CHOLECYSTECTOMY
Anesthesia: General | Site: Abdomen

## 2019-12-12 MED ORDER — PROMETHAZINE HCL 25 MG/ML IJ SOLN: 6.2500 mg | INTRAMUSCULAR | Status: DC | PRN

## 2019-12-12 MED ORDER — DROPERIDOL 2.5 MG/ML IJ SOLN
INTRAMUSCULAR | Status: DC | PRN
  Administered 2019-12-12: .625 mg via INTRAVENOUS

## 2019-12-12 MED ORDER — MEPERIDINE HCL 25 MG/ML IJ SOLN: 6.2500 mg | INTRAMUSCULAR | Status: DC | PRN

## 2019-12-12 MED ORDER — 0.9 % SODIUM CHLORIDE (POUR BTL) OPTIME
TOPICAL | Status: DC | PRN
  Administered 2019-12-12: 1000 mL

## 2019-12-12 MED ORDER — LIDOCAINE 2% (20 MG/ML) 5 ML SYRINGE
INTRAMUSCULAR | Status: DC | PRN
  Administered 2019-12-12: 20 mg via INTRAVENOUS

## 2019-12-12 MED ORDER — FENTANYL CITRATE (PF) 250 MCG/5ML IJ SOLN
INTRAMUSCULAR | Status: AC
  Filled 2019-12-12: qty 5

## 2019-12-12 MED ORDER — ROCURONIUM BROMIDE 10 MG/ML (PF) SYRINGE
PREFILLED_SYRINGE | INTRAVENOUS | Status: DC | PRN
  Administered 2019-12-12: 50 mg via INTRAVENOUS

## 2019-12-12 MED ORDER — FENTANYL CITRATE (PF) 250 MCG/5ML IJ SOLN
INTRAMUSCULAR | Status: DC | PRN
Start: 2019-12-12 — End: 2019-12-12
  Administered 2019-12-12 (×3): 50 ug via INTRAVENOUS

## 2019-12-12 MED ORDER — CIPROFLOXACIN IN D5W 400 MG/200ML IV SOLN
INTRAVENOUS | Status: DC | PRN
  Administered 2019-12-12: 400 mg via INTRAVENOUS

## 2019-12-12 MED ORDER — BUPIVACAINE HCL (PF) 0.25 % IJ SOLN
INTRAMUSCULAR | Status: AC
  Filled 2019-12-12: qty 30

## 2019-12-12 MED ORDER — SUGAMMADEX SODIUM 200 MG/2ML IV SOLN
INTRAVENOUS | Status: DC | PRN
  Administered 2019-12-12: 200 mg via INTRAVENOUS

## 2019-12-12 MED ORDER — EPHEDRINE 5 MG/ML INJ
INTRAVENOUS | Status: AC
  Filled 2019-12-12: qty 10

## 2019-12-12 MED ORDER — DEXAMETHASONE SODIUM PHOSPHATE 10 MG/ML IJ SOLN
INTRAMUSCULAR | Status: DC | PRN
  Administered 2019-12-12: 10 mg via INTRAVENOUS

## 2019-12-12 MED ORDER — ORAL CARE MOUTH RINSE: 15.0000 mL | Freq: Once | OROMUCOSAL | Status: AC

## 2019-12-12 MED ORDER — PROPOFOL 10 MG/ML IV BOLUS
INTRAVENOUS | Status: DC | PRN
  Administered 2019-12-12: 100 mg via INTRAVENOUS

## 2019-12-12 MED ORDER — LIDOCAINE 2% (20 MG/ML) 5 ML SYRINGE
INTRAMUSCULAR | Status: AC
  Filled 2019-12-12: qty 5

## 2019-12-12 MED ORDER — PROPOFOL 10 MG/ML IV BOLUS
INTRAVENOUS | Status: AC
  Filled 2019-12-12: qty 20

## 2019-12-12 MED ORDER — CIPROFLOXACIN IN D5W 400 MG/200ML IV SOLN
INTRAVENOUS | Status: AC
  Filled 2019-12-12: qty 200

## 2019-12-12 MED ORDER — ONDANSETRON HCL 4 MG/2ML IJ SOLN
INTRAMUSCULAR | Status: AC
  Filled 2019-12-12: qty 2

## 2019-12-12 MED ORDER — DEXAMETHASONE SODIUM PHOSPHATE 10 MG/ML IJ SOLN
INTRAMUSCULAR | Status: AC
  Filled 2019-12-12: qty 1

## 2019-12-12 MED ORDER — MIDAZOLAM HCL 2 MG/2ML IJ SOLN: 0.5000 mg | Freq: Once | INTRAMUSCULAR | Status: DC | PRN

## 2019-12-12 MED ORDER — SODIUM CHLORIDE 0.9 % IR SOLN
Status: DC | PRN
  Administered 2019-12-12: 1000 mL

## 2019-12-12 MED ORDER — HYDROMORPHONE HCL 1 MG/ML IJ SOLN: 0.2500 mg | INTRAMUSCULAR | Status: DC | PRN

## 2019-12-12 MED ORDER — ONDANSETRON HCL 4 MG/2ML IJ SOLN
INTRAMUSCULAR | Status: DC | PRN
  Administered 2019-12-12: 4 mg via INTRAVENOUS

## 2019-12-12 MED ORDER — CHLORHEXIDINE GLUCONATE 0.12 % MT SOLN
15.0000 mL | Freq: Once | OROMUCOSAL | Status: AC
  Administered 2019-12-12: 15 mL via OROMUCOSAL
  Filled 2019-12-12: qty 15

## 2019-12-12 MED ORDER — BUPIVACAINE HCL 0.25 % IJ SOLN
INTRAMUSCULAR | Status: DC | PRN
  Administered 2019-12-12: 9 mL

## 2019-12-12 MED ORDER — ROCURONIUM BROMIDE 10 MG/ML (PF) SYRINGE
PREFILLED_SYRINGE | INTRAVENOUS | Status: AC
  Filled 2019-12-12: qty 10

## 2019-12-12 MED ORDER — TRAMADOL HCL 50 MG PO TABS: 50.0000 mg | ORAL_TABLET | Freq: Four times a day (QID) | ORAL | 0 refills | Status: DC | PRN

## 2019-12-12 MED ORDER — LACTATED RINGERS IV SOLN: INTRAVENOUS | Status: DC

## 2019-12-12 SURGICAL SUPPLY — 38 items
CANISTER SUCT 3000ML PPV (MISCELLANEOUS) ×2 IMPLANT
CHLORAPREP W/TINT 26 (MISCELLANEOUS) ×2 IMPLANT
CLIP VESOLOCK MED LG 6/CT (CLIP) ×2 IMPLANT
COVER SURGICAL LIGHT HANDLE (MISCELLANEOUS) ×2 IMPLANT
COVER TRANSDUCER ULTRASND (DRAPES) ×2 IMPLANT
COVER WAND RF STERILE (DRAPES) IMPLANT
DEFOGGER SCOPE WARMER CLEARIFY (MISCELLANEOUS) IMPLANT
DERMABOND ADVANCED (GAUZE/BANDAGES/DRESSINGS) ×1
DERMABOND ADVANCED .7 DNX12 (GAUZE/BANDAGES/DRESSINGS) ×1 IMPLANT
ELECT REM PT RETURN 9FT ADLT (ELECTROSURGICAL) ×2
ELECTRODE REM PT RTRN 9FT ADLT (ELECTROSURGICAL) ×1 IMPLANT
GLOVE BIO SURGEON STRL SZ7.5 (GLOVE) ×2 IMPLANT
GOWN STRL REUS W/ TWL LRG LVL3 (GOWN DISPOSABLE) ×2 IMPLANT
GOWN STRL REUS W/ TWL XL LVL3 (GOWN DISPOSABLE) ×1 IMPLANT
GOWN STRL REUS W/TWL LRG LVL3 (GOWN DISPOSABLE) ×2
GOWN STRL REUS W/TWL XL LVL3 (GOWN DISPOSABLE) ×1
GRASPER SUT TROCAR 14GX15 (MISCELLANEOUS) ×2 IMPLANT
KIT BASIN OR (CUSTOM PROCEDURE TRAY) ×2 IMPLANT
KIT TURNOVER KIT B (KITS) ×2 IMPLANT
NEEDLE 22X1 1/2 (OR ONLY) (NEEDLE) ×2 IMPLANT
NEEDLE INSUFFLATION 14GA 120MM (NEEDLE) ×2 IMPLANT
NS IRRIG 1000ML POUR BTL (IV SOLUTION) ×2 IMPLANT
PAD ARMBOARD 7.5X6 YLW CONV (MISCELLANEOUS) ×2 IMPLANT
POUCH LAPAROSCOPIC INSTRUMENT (MISCELLANEOUS) ×2 IMPLANT
POUCH RETRIEVAL ECOSAC 10 (ENDOMECHANICALS) IMPLANT
POUCH RETRIEVAL ECOSAC 10MM (ENDOMECHANICALS)
SCISSORS LAP 5X35 DISP (ENDOMECHANICALS) ×2 IMPLANT
SET IRRIG TUBING LAPAROSCOPIC (IRRIGATION / IRRIGATOR) ×2 IMPLANT
SET TUBE SMOKE EVAC HIGH FLOW (TUBING) ×2 IMPLANT
SLEEVE ENDOPATH XCEL 5M (ENDOMECHANICALS) ×2 IMPLANT
SPECIMEN JAR SMALL (MISCELLANEOUS) ×2 IMPLANT
SUT MNCRL AB 4-0 PS2 18 (SUTURE) ×2 IMPLANT
TOWEL GREEN STERILE (TOWEL DISPOSABLE) ×2 IMPLANT
TOWEL GREEN STERILE FF (TOWEL DISPOSABLE) ×2 IMPLANT
TRAY LAPAROSCOPIC MC (CUSTOM PROCEDURE TRAY) ×2 IMPLANT
TROCAR XCEL NON-BLD 11X100MML (ENDOMECHANICALS) ×2 IMPLANT
TROCAR XCEL NON-BLD 5MMX100MML (ENDOMECHANICALS) ×2 IMPLANT
WATER STERILE IRR 1000ML POUR (IV SOLUTION) ×2 IMPLANT

## 2019-12-12 NOTE — Anesthesia Procedure Notes (Addendum)
Procedure Name: Intubation Date/Time: 12/12/2019 8:27 AM Performed by: Kyung Rudd, CRNA Pre-anesthesia Checklist: Patient identified, Emergency Drugs available, Suction available and Patient being monitored Patient Re-evaluated:Patient Re-evaluated prior to induction Oxygen Delivery Method: Circle System Utilized Preoxygenation: Pre-oxygenation with 100% oxygen Induction Type: IV induction Ventilation: Mask ventilation without difficulty Laryngoscope Size: Mac and 3 Grade View: Grade I Tube type: Oral Tube size: 7.0 mm Number of attempts: 1 Airway Equipment and Method: Stylet and Oral airway Placement Confirmation: ETT inserted through vocal cords under direct vision,  positive ETCO2 and breath sounds checked- equal and bilateral Secured at: 21 cm Tube secured with: Tape Dental Injury: Teeth and Oropharynx as per pre-operative assessment  Comments: AOI by Melinda Crutch, SRNA with Dr. Glennon Mac supervising.

## 2019-12-12 NOTE — Op Note (Signed)
12/12/2019  8:57 AM  PATIENT:  Kelly Curtis  74 y.o. female  PRE-OPERATIVE DIAGNOSIS:  GALLSTONES  POST-OPERATIVE DIAGNOSIS:  GALLSTONES  PROCEDURE:  Procedure(s): LAPAROSCOPIC CHOLECYSTECTOMY (N/A)  SURGEON:  Surgeon(s) and Role:    [name], MD - Primary  ANESTHESIA:   local and general  EBL:  minimal   BLOOD ADMINISTERED:none  DRAINS: none   LOCAL MEDICATIONS USED:  BUPIVICAINE   SPECIMEN:  Source of Specimen:  gallbladder  DISPOSITION OF SPECIMEN:  PATHOLOGY  COUNTS:  YES  TOURNIQUET:  * No tourniquets in log *  DICTATION: .Dragon Dictation Complications: none   Counts: reported as correct x 2   Findings:chronically inflamed gallbladder  Indications for procedure: Pt is a 26F with a history of common bile duct stones.  Details of the procedure: The patient was taken to the operating and placed in the supine position with bilateral SCDs in place. A time out was called and all facts were verified. A pneumoperitoneum was obtained via A Veress needle technique to a pressure of 63mm of mercury. A 49mm trochar was then placed in the right upper quadrant under visualization, and there were no injuries to any abdominal organs. A 11 mm port was then placed in the umbilical region after infiltrating with local anesthesia under direct visualization. A second epigastric port was placed under direct visualization.   The gallbladder was identified and retracted, the peritoneum was then sharply dissected from the gallbladder and this dissection was carried down to Calot's triangle. The cystic duct was identified and dissected circumferentially and seen going into the gallbladder 360.  The cystic artery was dissected away from the surrounding tissues.   The critical angle was obtained.   2 hemoclips were placed proximally one distally and the cystic duct transected. The cystic artery was identified and 2 hemoclips placed proximally and one distally and transected. We then  proceeded to remove the gallbladder off the hepatic fossa with Bovie cautery. A retrieval bag was then placed in the abdomen and gallbladder placed in the bag. The hepatic fossa was then reexamined and hemostasis was achieved with Bovie cautery and was excellent at this portion of the case. The subhepatic fossa and perihepatic fossa was then irrigated until the effluent was clear. The specimen bag and specimen were removed from the abdominal cavity.  The 11 mm trocar fascia was reapproximated with the Endo Close #1 Vicryl x2. The pneumoperitoneum was evacuated and all trochars removed under direct visulalization. The skin was then closed with 4-0 Monocryl and the skin dressed with Dermabond. The patient was awaken from general anesthesia and taken to the recovery room in stable condition.    PLAN OF CARE: Discharge to home after PACU  PATIENT DISPOSITION:  PACU - hemodynamically stable.   Delay start of Pharmacological VTE agent (>24hrs) due to surgical blood loss or risk of bleeding: not applicable

## 2019-12-12 NOTE — Anesthesia Postprocedure Evaluation (Signed)
Anesthesia Post Note  Patient: Kelly Curtis  Procedure(s) Performed: LAPAROSCOPIC CHOLECYSTECTOMY (N/A Abdomen)     Patient location during evaluation: PACU Anesthesia Type: General Level of consciousness: sedated, patient cooperative and oriented Pain management: pain level controlled Vital Signs Assessment: post-procedure vital signs reviewed and stable Respiratory status: spontaneous breathing, nonlabored ventilation and respiratory function stable Cardiovascular status: blood pressure returned to baseline and stable Postop Assessment: no apparent nausea or vomiting Anesthetic complications: no   No complications documented.  Last Vitals:  Vitals:   12/12/19 0935 12/12/19 0950  BP: (!) 97/55 (!) 101/50  Pulse: (!) 46 (!) 48  Resp: 11 10  Temp:  (!) 36.1 C  SpO2: 100% 99%    Last Pain:  Vitals:   12/12/19 0950  TempSrc:   PainSc: Asleep                 Marsela Kuan,E. Makenlee Mckeag

## 2019-12-12 NOTE — H&P (Signed)
History of Present Illness ( The patient is a 74 year old female who presents for evaluation of gall stones. Referred by: Dr. Therisa Doyne Chief Complaint: Gallstones  Patient is a 74 year old female, with a history of mitral valve replacement, who comes in secondary to a history of common bile duct stones. Patient recently underwent ERCP secondary to common duct stones. She did have a sphincterotomy. Patient states that pain began approximately 2-3 months ago patient was in the right upper quadrant and radiated to the back. States this is usually after dinner. She states that this continues is referred to GI. Patient underwent above-stated endoscopy.  Patient states that she continues with some discomfort on and off.  She had a previous oophorectomy and mitral valve replacement.      Past Surgical History  Breast Augmentation  Bilateral. Cataract Surgery  Bilateral. Colon Polyp Removal - Colonoscopy  Hysterectomy (not due to cancer) - Complete  Oral Surgery  Shoulder Surgery  Right. Valve Replacement   Allergies  PCN-50 *MISCELLANEOUS THERAPEUTIC CLASSES*  Vancomycin HCl *CHEMICALS*  Allergies Reconciled   Medication History  buPROPion HCl ER (XL) (150MG  Tablet ER 24HR, Oral) Active. Omeprazole (20MG  Capsule DR, Oral) Active. Zolpidem Tartrate (10MG  Tablet, Oral) Active. tiZANidine HCl (4MG  Tablet, Oral) Active. PARoxetine HCl (10MG  Tablet, Oral) Active. Naproxen (500MG  Tablet, Oral) Active. DULoxetine HCl (40MG  Capsule DR Part, Oral) Active. HYDROcodone-Acetaminophen (5-325MG  Tablet, Oral) Active. Methadone HCl (10MG  Tablet, Oral) Active. Metoprolol Succinate ER (25MG  Tablet ER 24HR, Oral) Active.  Social History  Alcohol use  Remotely quit alcohol use. No caffeine use  No drug use  Tobacco use  Former smoker.  Family History  Arthritis  Brother, Father, Mother, Sister. Cervical Cancer  Sister. Depression  Mother,  Sister. Heart Disease  Mother. Hypertension  Mother. Melanoma  Family Members In General, Father, Sister. Migraine Headache  Sister. Thyroid problems  Sister.  Other Problems  No pertinent past medical history  Oophorectomy     Review of Systems  General Present- Night Sweats. Not Present- Appetite Loss, Chills, Fatigue, Fever, Weight Gain and Weight Loss. Skin Present- Dryness and Rash. Not Present- Change in Wart/Mole, Hives, Jaundice, New Lesions, Non-Healing Wounds and Ulcer. HEENT Present- Seasonal Allergies and Wears glasses/contact lenses. Not Present- Earache, Hearing Loss, Hoarseness, Nose Bleed, Oral Ulcers, Ringing in the Ears, Sinus Pain, Sore Throat, Visual Disturbances and Yellow Eyes. Respiratory Not Present- Bloody sputum, Chronic Cough, Difficulty Breathing, Snoring and Wheezing. Breast Not Present- Breast Mass, Breast Pain, Nipple Discharge and Skin Changes. Cardiovascular Present- Swelling of Extremities. Not Present- Chest Pain, Difficulty Breathing Lying Down, Leg Cramps, Palpitations, Rapid Heart Rate and Shortness of Breath. Gastrointestinal Present- Indigestion. Not Present- Abdominal Pain, Bloating, Bloody Stool, Change in Bowel Habits, Chronic diarrhea, Constipation, Difficulty Swallowing, Excessive gas, Gets full quickly at meals, Hemorrhoids, Nausea, Rectal Pain and Vomiting. Female Genitourinary Not Present- Frequency, Nocturia, Painful Urination, Pelvic Pain and Urgency. Musculoskeletal Present- Back Pain and Swelling of Extremities. Not Present- Joint Pain, Joint Stiffness, Muscle Pain and Muscle Weakness. Neurological Present- Trouble walking. Not Present- Decreased Memory, Fainting, Headaches, Numbness, Seizures, Tingling, Tremor and Weakness. Psychiatric Present- Depression. Not Present- Anxiety, Bipolar, Change in Sleep Pattern, Fearful and Frequent crying. Endocrine Present- Cold Intolerance, Heat Intolerance and Hot flashes. Not Present-  Excessive Hunger, Hair Changes and New Diabetes. Hematology Present- Easy Bruising. Not Present- Blood Thinners, Excessive bleeding, Gland problems, HIV and Persistent Infections. All other systems negative  BP (!) 93/52   Pulse (!) 56   Temp 97.9 F (36.6  C) (Temporal)   Resp 18   Ht 5\' 4"  (1.626 m)   Wt 67.6 kg   SpO2 96%   BMI 25.58 kg/m     Physical Exam  The physical exam findings are as follows: Note: Constitutional: No acute distress, conversant, appears stated age  Eyes: Anicteric sclerae, moist conjunctiva, no lid lag  Neck: No thyromegaly, trachea midline, no cervical lymphadenopathy  Lungs: Clear to auscultation biilaterally, normal respiratory effot  Cardiovascular: regular rate & rhythm, no murmurs, no peripheal edema, pedal pulses 2+  GI: Soft, no masses or hepatosplenomegaly, non-tender to palpation  MSK: Normal gait, no clubbing cyanosis, edema  Skin: No rashes, palpation reveals normal skin turgor  Psychiatric: Appropriate judgment and insight, oriented to person, place, and time    Assessment & Plan CHOLEDOCHOLITHIASIS (K80.50) Impression: Patient is a 74 year old female with a history of choledocholithiasis likely cholelithiasis.  1. We will proceed to the operating room for a laparoscopic cholecystectomy  2. Risks and benefits were discussed with the patient to generally include, but not limited to: infection, bleeding, possible need for post op ERCP, damage to the bile ducts, bile leak, and possible need for further surgery. Alternatives were offered and described. All questions were answered and the patient voiced understanding of the procedure and wishes to proceed at this point with a laparoscopic cholecystectomy  I reviewed the patient's external notes from the referring physicians as well as consulting physician team. Each of the radiologic studies and lab studies were independently reviewed and interpreted. I discussed the  results of the above studies and how they relate to the patient's surgical problems.

## 2019-12-12 NOTE — Transfer of Care (Signed)
Immediate Anesthesia Transfer of Care Note  Patient: Kelly Curtis  Procedure(s) Performed: LAPAROSCOPIC CHOLECYSTECTOMY (N/A Abdomen)  Patient Location: PACU  Anesthesia Type:General  Level of Consciousness: drowsy  Airway & Oxygen Therapy: Patient Spontanous Breathing and Patient connected to nasal cannula oxygen  Post-op Assessment: Report given to RN, Post -op Vital signs reviewed and stable and Patient moving all extremities X 4  Post vital signs: Reviewed and stable  Last Vitals:  Vitals Value Taken Time  BP    Temp    Pulse 51 12/12/19 0918  Resp 13 12/12/19 0918  SpO2 90 % 12/12/19 0918  Vitals shown include unvalidated device data.  Last Pain:  Vitals:   12/12/19 0731  TempSrc:   PainSc: 4       Patients Stated Pain Goal: 4 (08/27/54 1537)  Complications: No complications documented.

## 2019-12-12 NOTE — Discharge Instructions (Signed)
[facility] ______[facility] °LAPAROSCOPIC SURGERY: POST OP INSTRUCTIONS °Always review your discharge instruction sheet given to you by the facility where your surgery was performed. °IF YOU HAVE DISABILITY OR FAMILY LEAVE FORMS, YOU MUST BRING THEM TO THE OFFICE FOR PROCESSING.   °DO NOT GIVE THEM TO YOUR DOCTOR. ° °1. A prescription for pain medication may be given to you upon discharge.  Take your pain medication as prescribed, if needed.  If narcotic pain medicine is not needed, then you may take acetaminophen (Tylenol) or ibuprofen (Advil) as needed. °2. Take your usually prescribed medications unless otherwise directed. °3. If you need a refill on your pain medication, please contact your pharmacy.  They will contact our office to request authorization. Prescriptions will not be filled after 5pm or on week-ends. °4. You should follow a light diet the first few days after arrival home, such as soup and crackers, etc.  Be sure to include lots of fluids daily. °5. Most patients will experience some swelling and bruising in the area of the incisions.  Ice packs will help.  Swelling and bruising can take several days to resolve.  °6. It is common to experience some constipation if taking pain medication after surgery.  Increasing fluid intake and taking a stool softener (such as Colace) will usually help or prevent this problem from occurring.  A mild laxative (Milk of Magnesia or Miralax) should be taken according to package instructions if there are no bowel movements after 48 hours. °7. Unless discharge instructions indicate otherwise, you may remove your bandages 24-48 hours after surgery, and you may shower at that time.  You may have steri-strips (small skin tapes) in place directly over the incision.  These strips should be left on the skin for 7-10 days.  If your surgeon used skin glue on the incision, you may shower in 24 hours.  The glue will flake off over the next 2-3 weeks.  Any sutures or  staples will be removed at the office during your follow-up visit. °8. ACTIVITIES:  You may resume regular (light) daily activities beginning the next day--such as daily self-care, walking, climbing stairs--gradually increasing activities as tolerated.  You may have sexual intercourse when it is comfortable.  Refrain from any heavy lifting or straining until approved by your doctor. °a. You may drive when you are no longer taking prescription pain medication, you can comfortably wear a seatbelt, and you can safely maneuver your car and apply brakes. °b. RETURN TO WORK:  __________________________________________________________ °9. You should see your doctor in the office for a follow-up appointment approximately 2-3 weeks after your surgery.  Make sure that you call for this appointment within a day or two after you arrive home to insure a convenient appointment time. °10. OTHER INSTRUCTIONS: __________________________________________________________________________________________________________________________ __________________________________________________________________________________________________________________________ °WHEN TO CALL YOUR DOCTOR: °1. Fever over 101.0 °2. Inability to urinate °3. Continued bleeding from incision. °4. Increased pain, redness, or drainage from the incision. °5. Increasing abdominal pain ° °The clinic staff is available to answer your questions during regular business hours.  Please don’t hesitate to call and ask to speak to one of the nurses for clinical concerns.  If you have a medical emergency, go to the nearest emergency room or call 911.  A surgeon from [facility] is always on call at the hospital. °[address], [address], [place], [place]  [address] ? [address] [address], [place], [place]   [address] °[phone] ? [phone] ? FAX [phone] °Web site:   [email or web address] °

## 2019-12-13 ENCOUNTER — Encounter (HOSPITAL_COMMUNITY): Payer: Self-pay | Admitting: General Surgery

## 2019-12-13 LAB — SURGICAL PATHOLOGY

## 2019-12-17 DIAGNOSIS — Z978 Presence of other specified devices: Secondary | ICD-10-CM | POA: Diagnosis not present

## 2019-12-17 DIAGNOSIS — I129 Hypertensive chronic kidney disease with stage 1 through stage 4 chronic kidney disease, or unspecified chronic kidney disease: Secondary | ICD-10-CM | POA: Diagnosis not present

## 2019-12-17 DIAGNOSIS — G8929 Other chronic pain: Secondary | ICD-10-CM | POA: Diagnosis not present

## 2019-12-17 DIAGNOSIS — M79671 Pain in right foot: Secondary | ICD-10-CM | POA: Diagnosis not present

## 2019-12-17 DIAGNOSIS — M79641 Pain in right hand: Secondary | ICD-10-CM | POA: Diagnosis not present

## 2019-12-17 DIAGNOSIS — F325 Major depressive disorder, single episode, in full remission: Secondary | ICD-10-CM | POA: Diagnosis not present

## 2019-12-17 DIAGNOSIS — N1831 Chronic kidney disease, stage 3a: Secondary | ICD-10-CM | POA: Diagnosis not present

## 2019-12-17 DIAGNOSIS — R519 Headache, unspecified: Secondary | ICD-10-CM | POA: Diagnosis not present

## 2019-12-17 DIAGNOSIS — D508 Other iron deficiency anemias: Secondary | ICD-10-CM | POA: Diagnosis not present

## 2019-12-20 ENCOUNTER — Emergency Department (HOSPITAL_BASED_OUTPATIENT_CLINIC_OR_DEPARTMENT_OTHER)
Admission: EM | Admit: 2019-12-20 | Discharge: 2019-12-21 | Disposition: A | Discharge status: Home / Self Care | Payer: Medicare Other | Attending: Emergency Medicine | Admitting: Emergency Medicine

## 2019-12-20 ENCOUNTER — Encounter (HOSPITAL_BASED_OUTPATIENT_CLINIC_OR_DEPARTMENT_OTHER): Payer: Self-pay

## 2019-12-20 ENCOUNTER — Emergency Department (HOSPITAL_BASED_OUTPATIENT_CLINIC_OR_DEPARTMENT_OTHER): Payer: Medicare Other

## 2019-12-20 ENCOUNTER — Other Ambulatory Visit: Payer: Self-pay

## 2019-12-20 DIAGNOSIS — G8918 Other acute postprocedural pain: Secondary | ICD-10-CM | POA: Diagnosis not present

## 2019-12-20 DIAGNOSIS — Z7982 Long term (current) use of aspirin: Secondary | ICD-10-CM | POA: Insufficient documentation

## 2019-12-20 DIAGNOSIS — Z96611 Presence of right artificial shoulder joint: Secondary | ICD-10-CM | POA: Diagnosis not present

## 2019-12-20 DIAGNOSIS — R1031 Right lower quadrant pain: Secondary | ICD-10-CM | POA: Diagnosis not present

## 2019-12-20 DIAGNOSIS — R1110 Vomiting, unspecified: Secondary | ICD-10-CM | POA: Diagnosis not present

## 2019-12-20 DIAGNOSIS — R630 Anorexia: Secondary | ICD-10-CM | POA: Insufficient documentation

## 2019-12-20 DIAGNOSIS — M25511 Pain in right shoulder: Secondary | ICD-10-CM | POA: Diagnosis not present

## 2019-12-20 DIAGNOSIS — K5900 Constipation, unspecified: Secondary | ICD-10-CM | POA: Diagnosis not present

## 2019-12-20 DIAGNOSIS — Z87891 Personal history of nicotine dependence: Secondary | ICD-10-CM | POA: Insufficient documentation

## 2019-12-20 DIAGNOSIS — R112 Nausea with vomiting, unspecified: Secondary | ICD-10-CM | POA: Diagnosis not present

## 2019-12-20 DIAGNOSIS — N189 Chronic kidney disease, unspecified: Secondary | ICD-10-CM | POA: Insufficient documentation

## 2019-12-20 DIAGNOSIS — R109 Unspecified abdominal pain: Secondary | ICD-10-CM | POA: Diagnosis not present

## 2019-12-20 LAB — URINALYSIS, ROUTINE W REFLEX MICROSCOPIC
Bilirubin Urine: NEGATIVE
Glucose, UA: NEGATIVE mg/dL
Hgb urine dipstick: NEGATIVE
Ketones, ur: NEGATIVE mg/dL
Leukocytes,Ua: NEGATIVE
Nitrite: NEGATIVE
Protein, ur: NEGATIVE mg/dL
Specific Gravity, Urine: 1.01 (ref 1.005–1.030)
pH: 6.5 (ref 5.0–8.0)

## 2019-12-20 LAB — COMPREHENSIVE METABOLIC PANEL
ALT: 81 U/L — ABNORMAL HIGH (ref 0–44)
AST: 64 U/L — ABNORMAL HIGH (ref 15–41)
Albumin: 3.4 g/dL — ABNORMAL LOW (ref 3.5–5.0)
Alkaline Phosphatase: 253 U/L — ABNORMAL HIGH (ref 38–126)
Anion gap: 12 (ref 5–15)
BUN: 16 mg/dL (ref 8–23)
CO2: 26 mmol/L (ref 22–32)
Calcium: 9.2 mg/dL (ref 8.9–10.3)
Chloride: 98 mmol/L (ref 98–111)
Creatinine, Ser: 0.88 mg/dL (ref 0.44–1.00)
GFR calc Af Amer: >60 mL/min (ref >60)
GFR calc non Af Amer: >60 mL/min (ref >60)
Glucose, Bld: 119 mg/dL — ABNORMAL HIGH (ref 70–99)
Potassium: 4.3 mmol/L (ref 3.5–5.1)
Sodium: 136 mmol/L (ref 135–145)
Total Bilirubin: 0.4 mg/dL (ref 0.3–1.2)
Total Protein: 7.4 g/dL (ref 6.5–8.1)

## 2019-12-20 LAB — CBC
HCT: 36.6 % (ref 36.0–46.0)
Hemoglobin: 11.3 g/dL — ABNORMAL LOW (ref 12.0–15.0)
MCH: 26.3 pg (ref 26.0–34.0)
MCHC: 30.9 g/dL (ref 30.0–36.0)
MCV: 85.1 fL (ref 80.0–100.0)
Platelets: 347 10*3/uL (ref 150–400)
RBC: 4.3 MIL/uL (ref 3.87–5.11)
RDW: 16.6 % — ABNORMAL HIGH (ref 11.5–15.5)
WBC: 8.5 10*3/uL (ref 4.0–10.5)
nRBC: 0 % (ref 0.0–0.2)

## 2019-12-20 LAB — LIPASE, BLOOD: Lipase: 27 U/L (ref 11–51)

## 2019-12-20 MED ORDER — ONDANSETRON HCL 4 MG/2ML IJ SOLN
4.0000 mg | Freq: Once | INTRAMUSCULAR | Status: AC
  Administered 2019-12-20: 4 mg via INTRAVENOUS
  Filled 2019-12-20: qty 2

## 2019-12-20 MED ORDER — SODIUM CHLORIDE 0.9 % IV BOLUS
1000.0000 mL | Freq: Once | INTRAVENOUS | Status: AC
  Administered 2019-12-20: 1000 mL via INTRAVENOUS

## 2019-12-20 MED ORDER — FENTANYL CITRATE (PF) 100 MCG/2ML IJ SOLN
50.0000 ug | Freq: Once | INTRAMUSCULAR | Status: AC
  Administered 2019-12-20: 50 ug via INTRAVENOUS
  Filled 2019-12-20: qty 2

## 2019-12-20 MED ORDER — IOHEXOL 300 MG/ML  SOLN
100.0000 mL | Freq: Once | INTRAMUSCULAR | Status: AC | PRN
  Administered 2019-12-20: 100 mL via INTRAVENOUS

## 2019-12-20 MED ORDER — ACETAMINOPHEN 325 MG PO TABS
650.0000 mg | ORAL_TABLET | Freq: Once | ORAL | Status: AC
  Administered 2019-12-20: 650 mg via ORAL
  Filled 2019-12-20: qty 2

## 2019-12-20 MED ORDER — IOHEXOL 9 MG/ML PO SOLN: 500.0000 mL | ORAL | Status: DC

## 2019-12-20 NOTE — Discharge Instructions (Addendum)
Start miralax 2 times daily

## 2019-12-20 NOTE — ED Provider Notes (Signed)
1145 case discussed with Dr. Harlow Asa of [facility].  Does not believe it is a bile leak based on size and timing.  No indication for antibiotics.  To be called by [facility] with appointment this week.    Can be discharged.     Molina Hollenback, MD 12/20/19 2348

## 2019-12-20 NOTE — ED Triage Notes (Addendum)
Pt c/o right side abd pain that has cont'd since cholecystectomy 9/16 -n/v-states she spoke with surgeon's office today and was advised to come to ED-NAD-to triage in w/c

## 2019-12-20 NOTE — ED Provider Notes (Signed)
[facility] DEPART[facility] Provider Note  CSN: [id number] Arrival date & time: 12/20/19 1707    History Chief Complaint  Patient presents with  . Abdominal Pain    HPI  Kelly Curtis is a 74 y.o. 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 Patient reports she has been in continued pain since surgery, localized in RUQ, radiating into her shoulder, associated with nausea vomiting and anorexia. She has not had a fever. Called her surgeon's office this afternoon and directed to the ED for evaluation.    Past Medical History:  Diagnosis Date  . Arthritis   . Cancer ([place])    skin cancer x 3  . Chronic kidney disease   . Constipation due to pain medication   . Depression with anxiety   . DVT (deep venous thrombosis) [facility])    age 46ish  . Dysrhythmia    PVCs; perioperative PAF with MV repair ~ 2000   . 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 GERD (gastroesophageal reflux disease)   . Headache    migraine heads- 4 times a year  . History of blood transfusion   . Hypotension   . 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 AUGMENTATION MAMMAPLASTY Bilateral   . CHOLECYSTECTOMY N/A 12/12/2019   Procedure: LAPAROSCOPIC CHOLECYSTECTOMY;  Surgeon: [name], MD;  Location: [facility];  Service: General;  Laterality: N/A;  . COLONOSCOPY    . ENDOSCOPIC RETROGRADE CHOLANGIOPANCREATOGRAPHY (ERCP) WITH PROPOFOL N/A 09/17/2019   Procedure: ENDOSCOPIC RETROGRADE CHOLANGIOPANCREATOGRAPHY (ERCP) WITH PROPOFOL;  Surgeon: Clarene Essex, MD;  [facility];  Service: Endoscopy;  Laterality: N/A;  . ESOPHAGOGASTRODUODENOSCOPY (EGD) WITH PROPOFOL N/A 09/06/2019    Procedure: ESOPHAGOGASTRODUODENOSCOPY (EGD) WITH PROPOFOL;  Surgeon: Ronnette Juniper, MD;  [facility];  Service: Gastroenterology;  Laterality: N/A;  . EYE SURGERY Bilateral    cataract  . MITRAL VALVE REPAIR    . REMOVAL OF STONES  09/17/2019   Procedure: REMOVAL OF STONES;  Surgeon: Clarene Essex, MD;  [facility];  Service: Endoscopy;;  . [name]  09/17/2019   Procedure: [name];  Surgeon: Clarene Essex, MD;  [facility];  Service: Endoscopy;;  . TOTAL SHOULDER ARTHROPLASTY Right 2013    Family History  Problem Relation Age of Onset  . Heart disease Mother   . Alzheimer's disease Mother   . Kidney disease Father   . Melanoma Sister   . Heart disease Brother     Social History   Tobacco Use  . Smoking status: Former Smoker    Years: 32.00    Types: Cigarettes    Quit date: 2000    Years since quitting: 21.7  . Smokeless tobacco: Never Used  Vaping Use  . Vaping Use: Never used  Substance Use Topics  . Alcohol use: Never  . Drug use: Never     Home Medications Prior to Admission medications   Medication Sig Start Date End Date Taking? Authorizing Provider  acetaminophen (TYLENOL) 500 MG tablet Take 1,000 mg by mouth every 6 (six) hours as needed for moderate pain.    [provider]  aspirin EC 81 MG tablet Take 81 mg by  mouth daily.    [provider]  buPROPion (WELLBUTRIN XL) 150 MG 24 hr tablet Take 150 mg by mouth daily.     [provider]  cetirizine (ZYRTEC) 10 MG tablet Take 10 mg by mouth daily.    [provider]  dimenhyDRINATE (DRAMAMINE) 50 MG tablet Take 50 mg by mouth every 8 (eight) hours as needed for nausea or dizziness.    [provider]  DULoxetine HCl 40 MG CPEP Take 40 mg by mouth daily.    [provider]  ferrous gluconate (FERGON) 240 (27 FE) MG tablet Take 240 mg by mouth daily.    [provider]  fluticasone (FLONASE) 50 MCG/ACT nasal spray  Place 1 spray into both nostrils daily.    [provider]  furosemide (LASIX) 20 MG tablet Take 20-40 mg by mouth See admin instructions. Take 40 mg by mouth in the morning and 20 mg in the evening    [provider]  HYDROcodone-acetaminophen (NORCO/VICODIN) 5-325 MG tablet Take 1 tablet by mouth every 6 (six) hours as needed for moderate pain.     [provider]  methadone (DOLOPHINE) 10 MG tablet Take 10 mg by mouth every 8 (eight) hours.     [provider]  metoprolol succinate (TOPROL-XL) 25 MG 24 hr tablet Take 12.5 mg by mouth daily.    [provider]  naloxone [facility]) nasal spray 4 mg/0.1 mL Place 1 spray into the nose once as needed (opiod overdose).    [provider]  naproxen (NAPROSYN) 500 MG tablet Take 500 mg by mouth 2 (two) times daily as needed for moderate pain.     [provider]  omeprazole (PRILOSEC) 20 MG capsule Take 20 mg by mouth daily.    [provider]  PARoxetine (PAXIL) 10 MG tablet Take 10 mg by mouth every evening.     [provider]  Pediatric Multivitamins-Iron (FLINTSTONES PLUS IRON PO) Take 1 tablet by mouth daily.    [provider]  polyethylene glycol (MIRALAX / GLYCOLAX) 17 g packet Take 17 g by mouth daily.    [provider]  polyvinyl alcohol (ARTIFICIAL TEARS) 1.4 % ophthalmic solution Place 1 drop into both eyes 3 (three) times daily as needed for dry eyes.     [provider]  PRESCRIPTION MEDICATION by Intrathecal route See admin instructions. Pt has intrathecal pain pump - continuous. - provided by Dr. Debarah Crape, [place], [place], [place] (compounded in [place]). Clonidine 162.14  mcg/day  and Gabapentin 178.96 mcg/day    [provider]  tiZANidine (ZANAFLEX) 4 MG tablet Take 4 mg by mouth 4 (four) times daily as needed (back pain).     [provider]  traMADol (ULTRAM) 50 MG tablet Take 1 tablet  (50 mg total) by mouth every 6 (six) hours as needed. 12/12/19 12/11/20  [name], MD  zolpidem (AMBIEN) 10 MG tablet Take 10 mg by mouth at bedtime as needed for sleep.    [provider]     Allergies    Demerol  [meperidine hcl], Gabapentin, Hydroxyzine, Midazolam, Trazodone and nefazodone, Escitalopram oxalate, Penicillins, and Vancomycin   Review of Systems   Review of Systems A comprehensive review of systems was completed and negative except as noted in HPI.    Physical Exam BP 125/73 (BP Location: Right Arm)   Pulse 70   Temp 98.9 F (37.2 C) (Oral)   Resp 19   SpO2 100%  Physical Exam Vitals and nursing note reviewed.  Constitutional:      Appearance: Normal appearance.  HENT:     Head: Normocephalic and atraumatic.     Nose: Nose normal.     Mouth/Throat:     Mouth: Mucous membranes are moist.  Eyes:     Extraocular Movements: Extraocular movements intact.     Conjunctiva/sclera: Conjunctivae normal.  Cardiovascular:     Rate and Rhythm: Normal rate.  Pulmonary:     Effort: Pulmonary effort is normal.     Breath sounds: Normal breath sounds.  Abdominal:     General: Abdomen is flat.     Palpations: Abdomen is soft.     Tenderness: There is abdominal tenderness in the right upper quadrant. There is no guarding. Negative signs include Murphy's sign and McBurney's sign.  Musculoskeletal:        General: No swelling. Normal range of motion.     Cervical back: Neck supple.  Skin:    General: Skin is warm and dry.     Findings: Erythema (hands and feet) present.  Neurological:     General: No focal deficit present.     Mental Status: She is alert.  Psychiatric:        Mood and Affect: Mood normal.      ED Results / Procedures / Treatments   Labs (all labs ordered are listed, but only abnormal results are displayed) Labs Reviewed  COMPREHENSIVE METABOLIC PANEL - Abnormal; Notable for the following components:      Result Value    Glucose, Bld 119 (*)    Albumin 3.4 (*)    AST 64 (*)    ALT 81 (*)    Alkaline Phosphatase 253 (*)    All other components within normal limits  CBC - Abnormal; Notable for the following components:   Hemoglobin 11.3 (*)    RDW 16.6 (*)    All other components within normal limits  LIPASE, BLOOD  URINALYSIS, ROUTINE W REFLEX MICROSCOPIC    EKG None   Radiology No results found.  Procedures Procedures  Medications Ordered in the ED Medications  iohexol (OMNIPAQUE) 9 MG/ML oral solution 500 mL (has no administration in time range)  acetaminophen (TYLENOL) tablet 650 mg (has no administration in time range)  sodium chloride 0.9 % bolus 1,000 mL (0 mLs Intravenous Stopped 12/20/19 2244)  ondansetron (ZOFRAN) injection 4 mg (4 mg Intravenous Given 12/20/19 2022)  fentaNYL (SUBLIMAZE) injection 50 mcg (50 mcg Intravenous Given 12/20/19 2022)  iohexol (OMNIPAQUE) 300 MG/ML solution 100 mL (100 mLs Intravenous Contrast Given 12/20/19 2249)     MDM Rules/Calculators/A&P MDM Labs done in triage reviewed, mild elevation in LFTs not unexpected so soon post-op. Will send for CT for evaluation of post-op complication. Pain and nausea meds for comfort, IVF.  ED Course  I have reviewed the triage vital signs and the nursing notes.  Pertinent labs & imaging results that were available during my care of the patient were reviewed by me and considered in my medical decision making (see chart for details).  Clinical Course as of Dec 20 2315  Fri Dec 20, 2019  2316 Care of the patient signed out to Dr. Randal Buba at the change of shift pending CT. Patient reports no improvement in pain with Fentanyl. Offered additional pain meds but she requests tylenol only.    [CS]    Clinical Course User Index [CS] Truddie Hidden, MD    Final Clinical Impression(s) / ED Diagnoses  Final diagnoses:  None    Rx / DC Orders ED Discharge Orders    None       Truddie Hidden, MD 12/20/19  2317

## 2020-01-02 DIAGNOSIS — I5189 Other ill-defined heart diseases: Secondary | ICD-10-CM | POA: Diagnosis not present

## 2020-01-02 DIAGNOSIS — Z23 Encounter for immunization: Secondary | ICD-10-CM | POA: Diagnosis not present

## 2020-01-02 DIAGNOSIS — I129 Hypertensive chronic kidney disease with stage 1 through stage 4 chronic kidney disease, or unspecified chronic kidney disease: Secondary | ICD-10-CM | POA: Diagnosis not present

## 2020-01-02 DIAGNOSIS — N1831 Chronic kidney disease, stage 3a: Secondary | ICD-10-CM | POA: Diagnosis not present

## 2020-01-14 DIAGNOSIS — I129 Hypertensive chronic kidney disease with stage 1 through stage 4 chronic kidney disease, or unspecified chronic kidney disease: Secondary | ICD-10-CM | POA: Diagnosis not present

## 2020-01-14 DIAGNOSIS — N1831 Chronic kidney disease, stage 3a: Secondary | ICD-10-CM | POA: Diagnosis not present

## 2020-01-14 DIAGNOSIS — F325 Major depressive disorder, single episode, in full remission: Secondary | ICD-10-CM | POA: Diagnosis not present

## 2020-01-14 DIAGNOSIS — D508 Other iron deficiency anemias: Secondary | ICD-10-CM | POA: Diagnosis not present

## 2020-02-05 ENCOUNTER — Other Ambulatory Visit: Payer: Self-pay | Admitting: Surgery

## 2020-02-05 ENCOUNTER — Other Ambulatory Visit (HOSPITAL_COMMUNITY): Payer: Self-pay | Admitting: Surgery

## 2020-02-05 DIAGNOSIS — M792 Neuralgia and neuritis, unspecified: Secondary | ICD-10-CM | POA: Diagnosis not present

## 2020-02-05 DIAGNOSIS — T8149XA Infection following a procedure, other surgical site, initial encounter: Secondary | ICD-10-CM

## 2020-02-05 DIAGNOSIS — I7381 Erythromelalgia: Secondary | ICD-10-CM | POA: Diagnosis not present

## 2020-02-05 DIAGNOSIS — R519 Headache, unspecified: Secondary | ICD-10-CM | POA: Diagnosis not present

## 2020-02-05 DIAGNOSIS — M542 Cervicalgia: Secondary | ICD-10-CM | POA: Diagnosis not present

## 2020-02-06 ENCOUNTER — Ambulatory Visit (HOSPITAL_COMMUNITY)
Admission: RE | Admit: 2020-02-06 | Discharge: 2020-02-06 | Disposition: A | Discharge status: Home / Self Care | Payer: Medicare Other | Source: Ambulatory Visit | Attending: Surgery | Admitting: Surgery

## 2020-02-06 ENCOUNTER — Other Ambulatory Visit: Payer: Self-pay

## 2020-02-06 DIAGNOSIS — Z66 Do not resuscitate: Secondary | ICD-10-CM | POA: Diagnosis not present

## 2020-02-06 DIAGNOSIS — Z20822 Contact with and (suspected) exposure to covid-19: Secondary | ICD-10-CM | POA: Diagnosis not present

## 2020-02-06 DIAGNOSIS — L02211 Cutaneous abscess of abdominal wall: Secondary | ICD-10-CM | POA: Diagnosis not present

## 2020-02-06 DIAGNOSIS — T8141XA Infection following a procedure, superficial incisional surgical site, initial encounter: Secondary | ICD-10-CM | POA: Diagnosis not present

## 2020-02-06 DIAGNOSIS — L03311 Cellulitis of abdominal wall: Secondary | ICD-10-CM | POA: Diagnosis not present

## 2020-02-06 DIAGNOSIS — T8149XA Infection following a procedure, other surgical site, initial encounter: Secondary | ICD-10-CM | POA: Insufficient documentation

## 2020-02-06 DIAGNOSIS — K651 Peritoneal abscess: Secondary | ICD-10-CM | POA: Diagnosis not present

## 2020-02-06 DIAGNOSIS — K5730 Diverticulosis of large intestine without perforation or abscess without bleeding: Secondary | ICD-10-CM | POA: Diagnosis not present

## 2020-02-06 DIAGNOSIS — I10 Essential (primary) hypertension: Secondary | ICD-10-CM | POA: Diagnosis not present

## 2020-02-06 DIAGNOSIS — I130 Hypertensive heart and chronic kidney disease with heart failure and stage 1 through stage 4 chronic kidney disease, or unspecified chronic kidney disease: Secondary | ICD-10-CM | POA: Diagnosis not present

## 2020-02-06 DIAGNOSIS — D734 Cyst of spleen: Secondary | ICD-10-CM | POA: Diagnosis not present

## 2020-02-06 LAB — POCT I-STAT CREATININE: Creatinine, Ser: 1 mg/dL (ref 0.44–1.00)

## 2020-02-06 MED ORDER — IOHEXOL 300 MG/ML  SOLN
100.0000 mL | Freq: Once | INTRAMUSCULAR | Status: AC | PRN
  Administered 2020-02-06: 100 mL via INTRAVENOUS

## 2020-02-09 ENCOUNTER — Inpatient Hospital Stay (HOSPITAL_COMMUNITY)
Admission: EM | Admit: 2020-02-09 | Discharge: 2020-02-11 | DRG: 857 | Disposition: A | Discharge status: Home Health | Payer: Medicare Other | Attending: Internal Medicine | Admitting: Internal Medicine

## 2020-02-09 ENCOUNTER — Other Ambulatory Visit: Payer: Self-pay

## 2020-02-09 ENCOUNTER — Encounter (HOSPITAL_COMMUNITY): Payer: Self-pay | Admitting: Obstetrics and Gynecology

## 2020-02-09 DIAGNOSIS — I10 Essential (primary) hypertension: Secondary | ICD-10-CM | POA: Diagnosis present

## 2020-02-09 DIAGNOSIS — Z66 Do not resuscitate: Secondary | ICD-10-CM | POA: Diagnosis present

## 2020-02-09 DIAGNOSIS — Z87891 Personal history of nicotine dependence: Secondary | ICD-10-CM

## 2020-02-09 DIAGNOSIS — I5032 Chronic diastolic (congestive) heart failure: Secondary | ICD-10-CM | POA: Diagnosis present

## 2020-02-09 DIAGNOSIS — Z8249 Family history of ischemic heart disease and other diseases of the circulatory system: Secondary | ICD-10-CM

## 2020-02-09 DIAGNOSIS — I5033 Acute on chronic diastolic (congestive) heart failure: Secondary | ICD-10-CM | POA: Diagnosis not present

## 2020-02-09 DIAGNOSIS — Z85828 Personal history of other malignant neoplasm of skin: Secondary | ICD-10-CM

## 2020-02-09 DIAGNOSIS — F419 Anxiety disorder, unspecified: Secondary | ICD-10-CM | POA: Diagnosis present

## 2020-02-09 DIAGNOSIS — Z20822 Contact with and (suspected) exposure to covid-19: Secondary | ICD-10-CM | POA: Diagnosis present

## 2020-02-09 DIAGNOSIS — N1831 Chronic kidney disease, stage 3a: Secondary | ICD-10-CM | POA: Diagnosis present

## 2020-02-09 DIAGNOSIS — N179 Acute kidney failure, unspecified: Secondary | ICD-10-CM | POA: Diagnosis present

## 2020-02-09 DIAGNOSIS — Z808 Family history of malignant neoplasm of other organs or systems: Secondary | ICD-10-CM

## 2020-02-09 DIAGNOSIS — K219 Gastro-esophageal reflux disease without esophagitis: Secondary | ICD-10-CM | POA: Diagnosis present

## 2020-02-09 DIAGNOSIS — L02211 Cutaneous abscess of abdominal wall: Secondary | ICD-10-CM | POA: Diagnosis present

## 2020-02-09 DIAGNOSIS — Z86718 Personal history of other venous thrombosis and embolism: Secondary | ICD-10-CM | POA: Diagnosis not present

## 2020-02-09 DIAGNOSIS — G894 Chronic pain syndrome: Secondary | ICD-10-CM | POA: Diagnosis present

## 2020-02-09 DIAGNOSIS — I7381 Erythromelalgia: Secondary | ICD-10-CM | POA: Diagnosis present

## 2020-02-09 DIAGNOSIS — Z79899 Other long term (current) drug therapy: Secondary | ICD-10-CM | POA: Diagnosis not present

## 2020-02-09 DIAGNOSIS — I130 Hypertensive heart and chronic kidney disease with heart failure and stage 1 through stage 4 chronic kidney disease, or unspecified chronic kidney disease: Secondary | ICD-10-CM | POA: Diagnosis present

## 2020-02-09 DIAGNOSIS — F418 Other specified anxiety disorders: Secondary | ICD-10-CM | POA: Diagnosis present

## 2020-02-09 DIAGNOSIS — Z7982 Long term (current) use of aspirin: Secondary | ICD-10-CM

## 2020-02-09 DIAGNOSIS — I9589 Other hypotension: Secondary | ICD-10-CM | POA: Diagnosis present

## 2020-02-09 DIAGNOSIS — F32A Depression, unspecified: Secondary | ICD-10-CM | POA: Diagnosis present

## 2020-02-09 DIAGNOSIS — T8141XA Infection following a procedure, superficial incisional surgical site, initial encounter: Secondary | ICD-10-CM | POA: Diagnosis present

## 2020-02-09 DIAGNOSIS — T8143XA Infection following a procedure, organ and space surgical site, initial encounter: Secondary | ICD-10-CM

## 2020-02-09 DIAGNOSIS — M1990 Unspecified osteoarthritis, unspecified site: Secondary | ICD-10-CM | POA: Diagnosis present

## 2020-02-09 DIAGNOSIS — Z841 Family history of disorders of kidney and ureter: Secondary | ICD-10-CM

## 2020-02-09 DIAGNOSIS — G8929 Other chronic pain: Secondary | ICD-10-CM

## 2020-02-09 DIAGNOSIS — E871 Hypo-osmolality and hyponatremia: Secondary | ICD-10-CM | POA: Diagnosis present

## 2020-02-09 DIAGNOSIS — L03311 Cellulitis of abdominal wall: Secondary | ICD-10-CM | POA: Diagnosis present

## 2020-02-09 DIAGNOSIS — I110 Hypertensive heart disease with heart failure: Secondary | ICD-10-CM | POA: Diagnosis not present

## 2020-02-09 LAB — CBC WITH DIFFERENTIAL/PLATELET
Abs Immature Granulocytes: 0.02 10*3/uL (ref 0.00–0.07)
Basophils Absolute: 0 10*3/uL (ref 0.0–0.1)
Basophils Relative: 0 %
Eosinophils Absolute: 0.1 10*3/uL (ref 0.0–0.5)
Eosinophils Relative: 1 %
HCT: 38.9 % (ref 36.0–46.0)
Hemoglobin: 11.2 g/dL — ABNORMAL LOW (ref 12.0–15.0)
Immature Granulocytes: 0 %
Lymphocytes Relative: 13 %
Lymphs Abs: 1 10*3/uL (ref 0.7–4.0)
MCH: 24.2 pg — ABNORMAL LOW (ref 26.0–34.0)
MCHC: 28.8 g/dL — ABNORMAL LOW (ref 30.0–36.0)
MCV: 84.2 fL (ref 80.0–100.0)
Monocytes Absolute: 0.7 10*3/uL (ref 0.1–1.0)
Monocytes Relative: 9 %
Neutro Abs: 5.8 10*3/uL (ref 1.7–7.7)
Neutrophils Relative %: 77 %
Platelets: 402 10*3/uL — ABNORMAL HIGH (ref 150–400)
RBC: 4.62 MIL/uL (ref 3.87–5.11)
RDW: 16.3 % — ABNORMAL HIGH (ref 11.5–15.5)
WBC: 7.6 10*3/uL (ref 4.0–10.5)
nRBC: 0 % (ref 0.0–0.2)

## 2020-02-09 LAB — SURGICAL PCR SCREEN
MRSA, PCR: NEGATIVE
Staphylococcus aureus: NEGATIVE

## 2020-02-09 LAB — COMPREHENSIVE METABOLIC PANEL
ALT: 13 U/L (ref 0–44)
AST: 15 U/L (ref 15–41)
Albumin: 3.8 g/dL (ref 3.5–5.0)
Alkaline Phosphatase: 137 U/L — ABNORMAL HIGH (ref 38–126)
Anion gap: 6 (ref 5–15)
BUN: 25 mg/dL — ABNORMAL HIGH (ref 8–23)
CO2: 30 mmol/L (ref 22–32)
Calcium: 9.2 mg/dL (ref 8.9–10.3)
Chloride: 98 mmol/L (ref 98–111)
Creatinine, Ser: 1.35 mg/dL — ABNORMAL HIGH (ref 0.44–1.00)
GFR, Estimated: 42 mL/min — ABNORMAL LOW (ref >60)
Glucose, Bld: 105 mg/dL — ABNORMAL HIGH (ref 70–99)
Potassium: 3.7 mmol/L (ref 3.5–5.1)
Sodium: 134 mmol/L — ABNORMAL LOW (ref 135–145)
Total Bilirubin: 0.5 mg/dL (ref 0.3–1.2)
Total Protein: 7.6 g/dL (ref 6.5–8.1)

## 2020-02-09 LAB — RESPIRATORY PANEL BY RT PCR (FLU A&B, COVID)
Influenza A by PCR: NEGATIVE
Influenza B by PCR: NEGATIVE
SARS Coronavirus 2 by RT PCR: NEGATIVE

## 2020-02-09 MED ORDER — CHLORHEXIDINE GLUCONATE CLOTH 2 % EX PADS
6.0000 | MEDICATED_PAD | Freq: Once | CUTANEOUS | Status: AC
  Administered 2020-02-10: 6 via TOPICAL

## 2020-02-09 MED ORDER — POLYETHYLENE GLYCOL 3350 17 G PO PACK
17.0000 g | PACK | Freq: Every day | ORAL | Status: DC
  Administered 2020-02-09 – 2020-02-11 (×2): 17 g via ORAL
  Filled 2020-02-09 (×2): qty 1

## 2020-02-09 MED ORDER — HYDROCODONE-ACETAMINOPHEN 5-325 MG PO TABS
1.0000 | ORAL_TABLET | Freq: Four times a day (QID) | ORAL | Status: DC | PRN
  Administered 2020-02-09 – 2020-02-11 (×5): 1 via ORAL
  Filled 2020-02-09 (×5): qty 1

## 2020-02-09 MED ORDER — ZOLPIDEM TARTRATE 5 MG PO TABS
5.0000 mg | ORAL_TABLET | Freq: Once | ORAL | Status: AC
  Administered 2020-02-09: 5 mg via ORAL
  Filled 2020-02-09: qty 1

## 2020-02-09 MED ORDER — ASPIRIN EC 81 MG PO TBEC
81.0000 mg | DELAYED_RELEASE_TABLET | Freq: Every day | ORAL | Status: DC
  Administered 2020-02-11: 81 mg via ORAL
  Filled 2020-02-09: qty 1

## 2020-02-09 MED ORDER — PANTOPRAZOLE SODIUM 40 MG PO TBEC
40.0000 mg | DELAYED_RELEASE_TABLET | Freq: Every day | ORAL | Status: DC
  Administered 2020-02-09 – 2020-02-11 (×2): 40 mg via ORAL
  Filled 2020-02-09 (×2): qty 1

## 2020-02-09 MED ORDER — SODIUM CHLORIDE 0.9 % IV BOLUS
1000.0000 mL | Freq: Once | INTRAVENOUS | Status: AC
  Administered 2020-02-09: 1000 mL via INTRAVENOUS

## 2020-02-09 MED ORDER — METRONIDAZOLE 500 MG PO TABS
500.0000 mg | ORAL_TABLET | Freq: Three times a day (TID) | ORAL | Status: DC
  Administered 2020-02-09 – 2020-02-11 (×4): 500 mg via ORAL
  Filled 2020-02-09 (×4): qty 1

## 2020-02-09 MED ORDER — METRONIDAZOLE 500 MG PO TABS
500.0000 mg | ORAL_TABLET | Freq: Once | ORAL | Status: AC
  Administered 2020-02-09: 500 mg via ORAL

## 2020-02-09 MED ORDER — SODIUM CHLORIDE 0.9% FLUSH
3.0000 mL | Freq: Two times a day (BID) | INTRAVENOUS | Status: DC
  Administered 2020-02-09 – 2020-02-11 (×2): 3 mL via INTRAVENOUS

## 2020-02-09 MED ORDER — DULOXETINE HCL 40 MG PO CPEP: 40.0000 mg | ORAL_CAPSULE | Freq: Every day | ORAL | Status: DC

## 2020-02-09 MED ORDER — BUPROPION HCL ER (XL) 150 MG PO TB24: 150.0000 mg | ORAL_TABLET | Freq: Every day | ORAL | Status: DC

## 2020-02-09 MED ORDER — SENNOSIDES-DOCUSATE SODIUM 8.6-50 MG PO TABS: 1.0000 | ORAL_TABLET | Freq: Every evening | ORAL | Status: DC | PRN

## 2020-02-09 MED ORDER — ENOXAPARIN SODIUM 40 MG/0.4ML ~~LOC~~ SOLN
40.0000 mg | SUBCUTANEOUS | Status: DC
  Administered 2020-02-10: 40 mg via SUBCUTANEOUS
  Filled 2020-02-09 (×2): qty 0.4

## 2020-02-09 MED ORDER — NALOXONE HCL 0.4 MG/ML IJ SOLN: 0.4000 mg | Freq: Once | INTRAMUSCULAR | Status: DC | PRN

## 2020-02-09 MED ORDER — METOPROLOL SUCCINATE ER 25 MG PO TB24
12.5000 mg | ORAL_TABLET | Freq: Every day | ORAL | Status: DC
  Administered 2020-02-09 – 2020-02-11 (×3): 12.5 mg via ORAL
  Filled 2020-02-09 (×3): qty 1

## 2020-02-09 MED ORDER — TIZANIDINE HCL 4 MG PO TABS: 4.0000 mg | ORAL_TABLET | Freq: Three times a day (TID) | ORAL | Status: DC | PRN

## 2020-02-09 MED ORDER — CHLORHEXIDINE GLUCONATE CLOTH 2 % EX PADS
6.0000 | MEDICATED_PAD | Freq: Once | CUTANEOUS | Status: AC
  Administered 2020-02-09: 6 via TOPICAL

## 2020-02-09 MED ORDER — LEVETIRACETAM 250 MG PO TABS
250.0000 mg | ORAL_TABLET | Freq: Two times a day (BID) | ORAL | Status: DC
  Administered 2020-02-09 – 2020-02-11 (×3): 250 mg via ORAL
  Filled 2020-02-09 (×5): qty 1

## 2020-02-09 MED ORDER — METHOCARBAMOL 500 MG PO TABS
500.0000 mg | ORAL_TABLET | Freq: Three times a day (TID) | ORAL | Status: DC | PRN
  Administered 2020-02-09: 500 mg via ORAL
  Filled 2020-02-09: qty 1

## 2020-02-09 MED ORDER — NALOXONE HCL 4 MG/0.1ML NA LIQD: 1.0000 | Freq: Once | NASAL | Status: DC | PRN

## 2020-02-09 MED ORDER — METHADONE HCL 10 MG PO TABS
10.0000 mg | ORAL_TABLET | Freq: Three times a day (TID) | ORAL | Status: DC
  Administered 2020-02-09 – 2020-02-11 (×5): 10 mg via ORAL
  Filled 2020-02-09 (×5): qty 1

## 2020-02-09 MED ORDER — PAROXETINE HCL 10 MG PO TABS: 10.0000 mg | ORAL_TABLET | Freq: Every day | ORAL | Status: DC

## 2020-02-09 MED ORDER — LINEZOLID 600 MG/300ML IV SOLN
600.0000 mg | Freq: Once | INTRAVENOUS | Status: AC
  Administered 2020-02-09: 600 mg via INTRAVENOUS
  Filled 2020-02-09 (×5): qty 300

## 2020-02-09 MED ORDER — CIPROFLOXACIN IN D5W 400 MG/200ML IV SOLN
400.0000 mg | Freq: Once | INTRAVENOUS | Status: AC
  Administered 2020-02-09: 400 mg via INTRAVENOUS

## 2020-02-09 MED ORDER — LINEZOLID 600 MG/300ML IV SOLN
600.0000 mg | Freq: Two times a day (BID) | INTRAVENOUS | Status: DC
  Administered 2020-02-10 – 2020-02-11 (×3): 600 mg via INTRAVENOUS
  Filled 2020-02-09 (×4): qty 300

## 2020-02-09 MED ORDER — CIPROFLOXACIN IN D5W 400 MG/200ML IV SOLN
400.0000 mg | Freq: Two times a day (BID) | INTRAVENOUS | Status: DC
  Administered 2020-02-10 – 2020-02-11 (×3): 400 mg via INTRAVENOUS
  Filled 2020-02-09 (×3): qty 200

## 2020-02-09 NOTE — Consult Note (Signed)
General Surgery [facility]  Reason for Consult: abdominal wall abscess after cholecystectomy  Referring Physician: Dr. Marva Panda, [facility]  Kelly Curtis is an 74 y.o. female.  HPI: Patient is a 74 year old female known to our surgical service.  Patient had undergone a laparoscopic cholecystectomy in mid September 2021 performed by Dr. [name].  Patient initially did well following surgery but has developed pain in the right upper quadrant of the abdomen.  She was evaluated in our office 3 days ago and referred for CT scan of the abdomen.  CT scan demonstrates a multiloculated collection in the right upper quadrant of the abdominal wall likely representing abscess.  This is at the site of one of the surgical port placements for her cholecystectomy.  Patient presented to the emergency department today for evaluation accompanied by her family.  She has been admitted to the medical service with right upper quadrant abdominal wall abscess and cellulitis.  Past Medical History:  Diagnosis Date  . Arthritis   . Cancer ([place])    skin cancer x 3  . Chronic kidney disease   . Constipation due to pain medication   . Depression with anxiety   . DVT (deep venous thrombosis) [facility])    age 49ish  . Dysrhythmia    PVCs; perioperative PAF with MV repair ~ 2000   . 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 GERD (gastroesophageal reflux disease)   . Headache    migraine heads- 4 times a year  . History of blood transfusion   . Hypotension   . 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 AUGMENTATION MAMMAPLASTY Bilateral   . CHOLECYSTECTOMY N/A 12/12/2019   Procedure: LAPAROSCOPIC CHOLECYSTECTOMY;  Surgeon: [name], MD;  Location: [facility];  Service: General;  Laterality: N/A;  . COLONOSCOPY    . ENDOSCOPIC RETROGRADE CHOLANGIOPANCREATOGRAPHY (ERCP) WITH  PROPOFOL N/A 09/17/2019   Procedure: ENDOSCOPIC RETROGRADE CHOLANGIOPANCREATOGRAPHY (ERCP) WITH PROPOFOL;  Surgeon: Clarene Essex, MD;  [facility];  Service: Endoscopy;  Laterality: N/A;  . ESOPHAGOGASTRODUODENOSCOPY (EGD) WITH PROPOFOL N/A 09/06/2019   Procedure: ESOPHAGOGASTRODUODENOSCOPY (EGD) WITH PROPOFOL;  Surgeon: Ronnette Juniper, MD;  [facility];  Service: Gastroenterology;  Laterality: N/A;  . EYE SURGERY Bilateral    cataract  . MITRAL VALVE REPAIR    . REMOVAL OF STONES  09/17/2019   Procedure: REMOVAL OF STONES;  Surgeon: Clarene Essex, MD;  [facility];  Service: Endoscopy;;  . [name]  09/17/2019   Procedure: [name];  Surgeon: Clarene Essex, MD;  [facility];  Service: Endoscopy;;  . TOTAL SHOULDER ARTHROPLASTY Right 2013    Family History  Problem Relation Age of Onset  . Heart disease Mother   . Alzheimer's disease Mother   . Kidney disease Father   . Melanoma Sister   . Heart disease Brother     Social History:  reports that she quit smoking about 21 years ago. Her smoking use included cigarettes. She quit after 32.00 years of use. She has never used smokeless tobacco. She reports that she does not drink alcohol and does not use drugs.  Allergies:  Allergies  Allergen Reactions  . Demerol  [Meperidine Hcl] Nausea And Vomiting  . Gabapentin Other (See Comments)    Suicidal ideation (03/23/18 - pt currently is getting small amount in pain pump with no reaction)  . Hydroxyzine  Other (See Comments)    hallucinations  . Midazolam Other (See Comments)    Bradycardia  . Trazodone And Nefazodone Other (See Comments)    dizziness  . Escitalopram Oxalate Other (See Comments)    confusion  . Penicillins Rash    DID THE REACTION INVOLVE: Swelling of the face/tongue/throat, SOB, or low BP? No Sudden or severe rash/hives, skin peeling, or the inside of the mouth or nose? Yes Did it require medical treatment? Yes When did it  last happen?74 years old If all above answers are "NO", may proceed with cephalosporin use.  . Vancomycin Swelling and Rash    Medications: I have reviewed the patient's current medications.  Results for orders placed or performed during the hospital encounter of 02/09/20 (from the past 48 hour(s))  Comprehensive metabolic panel     Status: Abnormal   Collection Time: 02/09/20 12:54 PM  Result Value Ref Range   Sodium 134 (L) 135 - 145 mmol/L   Potassium 3.7 3.5 - 5.1 mmol/L   Chloride 98 98 - 111 mmol/L   CO2 30 22 - 32 mmol/L   Glucose, Bld 105 (H) 70 - 99 mg/dL    Comment: Glucose reference range applies only to samples taken after fasting for at least 8 hours.   BUN 25 (H) 8 - 23 mg/dL   Creatinine, Ser 1.35 (H) 0.44 - 1.00 mg/dL   Calcium 9.2 8.9 - 10.3 mg/dL   Total Protein 7.6 6.5 - 8.1 g/dL   Albumin 3.8 3.5 - 5.0 g/dL   AST 15 15 - 41 U/L   ALT 13 0 - 44 U/L   Alkaline Phosphatase 137 (H) 38 - 126 U/L   Total Bilirubin 0.5 0.3 - 1.2 mg/dL   GFR, Estimated 42 (L) >60 mL/min    Comment: (NOTE) Calculated using the CKD-EPI Creatinine Equation (2021)    Anion gap 6 5 - 15    Comment: Performed at [facility], [place] [address], [place], [place] [address]  CBC with Differential     Status: Abnormal   Collection Time: 02/09/20 12:54 PM  Result Value Ref Range   WBC 7.6 4.0 - 10.5 K/uL   RBC 4.62 3.87 - 5.11 MIL/uL   Hemoglobin 11.2 (L) 12.0 - 15.0 g/dL   HCT 38.9 36 - 46 %   MCV 84.2 80.0 - 100.0 fL   MCH 24.2 (L) 26.0 - 34.0 pg   MCHC 28.8 (L) 30.0 - 36.0 g/dL   RDW 16.3 (H) 11.5 - 15.5 %   Platelets 402 (H) 150 - 400 K/uL   nRBC 0.0 0.0 - 0.2 %   Neutrophils Relative % 77 %   Neutro Abs 5.8 1.7 - 7.7 K/uL   Lymphocytes Relative 13 %   Lymphs Abs 1.0 0.7 - 4.0 K/uL   Monocytes Relative 9 %   Monocytes Absolute 0.7 0.1 - 1.0 K/uL   Eosinophils Relative 1 %   Eosinophils Absolute 0.1 0.0 - 0.5 K/uL   Basophils Relative 0 %   Basophils  Absolute 0.0 0.0 - 0.1 K/uL   Immature Granulocytes 0 %   Abs Immature Granulocytes 0.02 0.00 - 0.07 K/uL    Comment: Performed at [facility], [place] [address] [place], [place] [address]  Respiratory Panel by RT PCR (Flu A&B, Covid) - Nasopharyngeal Swab     Status: None   Collection Time: 02/09/20  2:46 PM   Specimen: Nasopharyngeal Swab  Result Value Ref Range   SARS  Coronavirus 2 by RT PCR NEGATIVE NEGATIVE    Comment: (NOTE) SARS-CoV-2 target nucleic acids are NOT DETECTED.  The SARS-CoV-2 RNA is generally detectable in upper respiratoy specimens during the acute phase of infection. The lowest concentration of SARS-CoV-2 viral copies this assay can detect is 131 copies/mL. A negative result does not preclude SARS-Cov-2 infection and should not be used as the sole basis for treatment or other patient management decisions. A negative result may occur with  improper specimen collection/handling, submission of specimen other than nasopharyngeal swab, presence of viral mutation(s) within the areas targeted by this assay, and inadequate number of viral copies (<131 copies/mL). A negative result must be combined with clinical observations, patient history, and epidemiological information. The expected result is Negative.  Fact Sheet for Patients:  PinkCheek.be  Fact Sheet for Healthcare Providers:  GravelBags.it  This test is no t yet approved or cleared by the [place] FDA and  has been authorized for detection and/or diagnosis of SARS-CoV-2 by FDA under an Emergency Use Authorization (EUA). This EUA will remain  in effect (meaning this test can be used) for the duration of the COVID-19 declaration under Section 564(b)(1) of the Act, 21 U.S.C. section 360bbb-3(b)(1), unless the authorization is terminated or revoked sooner.     Influenza A by PCR NEGATIVE NEGATIVE   Influenza B by PCR NEGATIVE  NEGATIVE    Comment: (NOTE) The Xpert Xpress SARS-CoV-2/FLU/RSV assay is intended as an aid in  the diagnosis of influenza from Nasopharyngeal swab specimens and  should not be used as a sole basis for treatment. Nasal washings and  aspirates are unacceptable for Xpert Xpress SARS-CoV-2/FLU/RSV  testing.  Fact Sheet for Patients: PinkCheek.be  Fact Sheet for Healthcare Providers: GravelBags.it  This test is not yet approved or cleared by the [place] FDA and  has been authorized for detection and/or diagnosis of SARS-CoV-2 by  FDA under an Emergency Use Authorization (EUA). This EUA will remain  in effect (meaning this test can be used) for the duration of the  Covid-19 declaration under Section 564(b)(1) of the Act, 21  U.S.C. section 360bbb-3(b)(1), unless the authorization is  terminated or revoked. Performed at [facility], [place] [address], [place], [place] [address]     No results found.  Review of Systems  Constitutional: Negative for diaphoresis and fever.  HENT: Negative.   Eyes: Negative.   Respiratory: Negative.   Cardiovascular: Negative.   Gastrointestinal: Positive for abdominal pain.  Endocrine: Negative.   Genitourinary: Negative.   Musculoskeletal: Negative.   Skin: Positive for color change and wound.  Allergic/Immunologic: Negative.   Neurological: Negative.   Hematological: Negative.   Psychiatric/Behavioral: Negative.    Blood pressure (!) 144/83, pulse 80, temperature 98.2 F (36.8 C), resp. rate 20, SpO2 100 %. Physical Exam  Patient just arrived in her room on 5 W. at [facility].  Her daughter is at the bedside.  Examination of the abdomen shows a area of localized cellulitis in the right upper quadrant.  Wound is obviously fluctuant.  There has been spontaneous perforation of the skin with drainage of tan purulent fluid which is being cleansed currently  and treated with an absorbent dressing.  Remainder the abdomen is soft and nontender.  Assessment/Plan: Abdominal wall abscess at site of operative trocar from cholecystectomy  Agree with admission for IV antibiotics for localized cellulitis and abscess  Will plan operative incision, drainage, and packing on 11/15 by Dr. Romana Juniper  NPO after  MN  Abx selection per medical service and pharmacy - multiple allergies  Armandina Gemma, MD [facility] Office: [place] 02/09/2020, 5:32 PM

## 2020-02-09 NOTE — ED Provider Notes (Signed)
[facility] DEPT Provider Note   CSN: [id number] Arrival date & time: 02/09/20  1239    History Chief Complaint  Patient presents with  . Abscess    Kelly Curtis is a 74 y.o. female with past medical history significant for DVT, arthritis, depression, GERD, erythromyalgia, valve disorder, CHF who presents for evaluation of wound.  Patient had lap chole in September by Dr. [name] of [facility] surgery.  Patient states 2 weeks ago she noted surrounding erythema to her right upper port site.  Was seen by additional physician at [facility] surgery with concern for deep space infection. CT ABD 02/06/20 with abscess RUQ.  She states fever yesterday evening per sister.  Taking aspirin.  She is on chronic pain medicine for erythromelalgia.  She denies any nausea, vomiting, chest pain, shortness of breath, dysuria, hematuria.  Last p.o. intake at 2 AM.  Denies additional aggravating or alleviating factors.  Called surgeon's office who was told to come to the emergency department for admission for IV antibiotics with possible surgical intervention  History obtained from patient and past medical records.  No interpreter used.  HPI     Past Medical History:  Diagnosis Date  . Arthritis   . Cancer ([place])    skin cancer x 3  . Chronic kidney disease   . Constipation due to pain medication   . Depression with anxiety   . DVT (deep venous thrombosis) [facility])    age 66ish  . Dysrhythmia    PVCs; perioperative PAF with MV repair ~ 2000   . 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 GERD (gastroesophageal reflux disease)   . Headache    migraine heads- 4 times a year  . History of blood transfusion   . Hypotension   . 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 Abdominal wall abscess 02/09/2020  . AKI (acute kidney injury) ([place]) 03/24/2018  . HTN  (hypertension) 03/24/2018  . Acute on chronic diastolic CHF (congestive heart failure) ([place]) 03/23/2018  . Microcytic anemia 03/23/2018  . Leg swelling 03/23/2018  . 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 GERD (gastroesophageal reflux disease)   . Depression with anxiety     Past Surgical History:  Procedure Laterality Date  . AUGMENTATION MAMMAPLASTY Bilateral   . CHOLECYSTECTOMY N/A 12/12/2019   Procedure: LAPAROSCOPIC CHOLECYSTECTOMY;  Surgeon: [name], MD;  Location: [place] [facility];  Service: General;  Laterality: N/A;  . COLONOSCOPY    . ENDOSCOPIC RETROGRADE CHOLANGIOPANCREATOGRAPHY (ERCP) WITH PROPOFOL N/A 09/17/2019   Procedure: ENDOSCOPIC RETROGRADE CHOLANGIOPANCREATOGRAPHY (ERCP) WITH PROPOFOL;  Surgeon: Clarene Essex, MD;  Location: [facility];  Service: Endoscopy;  Laterality: N/A;  . ESOPHAGOGASTRODUODENOSCOPY (EGD) WITH PROPOFOL N/A 09/06/2019   Procedure: ESOPHAGOGASTRODUODENOSCOPY (EGD) WITH PROPOFOL;  Surgeon: Ronnette Juniper, MD;  Location: [facility];  Service: Gastroenterology;  Laterality: N/A;  . EYE SURGERY Bilateral    cataract  . MITRAL VALVE REPAIR    . REMOVAL OF STONES  09/17/2019   Procedure: REMOVAL OF STONES;  Surgeon: Clarene Essex, MD;  Location: [facility];  Service: Endoscopy;;  . [name]  09/17/2019   Procedure: [name];  Surgeon: Clarene Essex, MD;  Location: [facility];  Service: Endoscopy;;  . TOTAL SHOULDER ARTHROPLASTY Right 2013     OB History   No obstetric history on file.     Family History  Problem Relation Age  of Onset  . Heart disease Mother   . Alzheimer's disease Mother   . Kidney disease Father   . Melanoma Sister   . Heart disease Brother     Social History   Tobacco Use  . Smoking status: Former Smoker    Years: 32.00    Types: Cigarettes    Quit date: 2000    Years since quitting: 21.8  . Smokeless tobacco: Never Used  Vaping Use  . Vaping Use: Never used  Substance Use Topics  . Alcohol use: Never  . Drug use:  Never    Home Medications Prior to Admission medications   Medication Sig Start Date End Date Taking? Authorizing Provider  acetaminophen (TYLENOL) 500 MG tablet Take 1,000 mg by mouth every 6 (six) hours as needed for moderate pain.   Yes [provider]  aspirin EC 81 MG tablet Take 81 mg by mouth daily.   Yes [provider]  buPROPion (WELLBUTRIN XL) 150 MG 24 hr tablet Take 150 mg by mouth daily.    Yes [provider]  dimenhyDRINATE (DRAMAMINE) 50 MG tablet Take 50 mg by mouth every 8 (eight) hours as needed for nausea or dizziness.   Yes [provider]  DULoxetine (CYMBALTA) 30 MG capsule Take 30 mg by mouth 2 (two) times daily.    Yes [provider]  DULoxetine HCl 40 MG CPEP Take 40 mg by mouth daily.   Yes [provider]  HYDROcodone-acetaminophen (NORCO/VICODIN) 5-325 MG tablet Take 1 tablet by mouth every 6 (six) hours as needed for moderate pain.    Yes [provider]  levETIRAcetam (KEPPRA) 250 MG tablet Take 250 mg by mouth 2 (two) times daily.  12/17/19 06/14/20 Yes [provider]  methadone (DOLOPHINE) 10 MG tablet Take 10 mg by mouth every 8 (eight) hours.    Yes [provider]  metoprolol succinate (TOPROL-XL) 25 MG 24 hr tablet Take 12.5 mg by mouth daily.   Yes [provider]  mexiletine (MEXITIL) 150 MG capsule Take 150 mg by mouth every other day.  02/05/20 06/04/20 Yes [provider]  midodrine (PROAMATINE) 5 MG tablet Take 5 mg by mouth daily.  12/29/19  Yes [provider]  naloxone (NARCAN) nasal spray 4 mg/0.1 mL Place 1 spray into the nose once as needed (opiod overdose).   Yes [provider]  naproxen (NAPROSYN) 500 MG tablet Take 500 mg by mouth 2 (two) times daily as needed for moderate pain.    Yes [provider]  omeprazole (PRILOSEC) 20 MG capsule Take 20 mg by mouth daily.   Yes [provider]  PARoxetine (PAXIL) 10 MG  tablet Take 10 mg by mouth daily.    Yes [provider]  Pediatric Multivitamins-Iron (FLINTSTONES PLUS IRON PO) Take 1 tablet by mouth daily.   Yes [provider]  polyethylene glycol (MIRALAX / GLYCOLAX) 17 g packet Take 17 g by mouth daily.   Yes [provider]  polyvinyl alcohol (ARTIFICIAL TEARS) 1.4 % ophthalmic solution Place 1 drop into both eyes 3 (three) times daily as needed for dry eyes.    Yes [provider]  tiZANidine (ZANAFLEX) 4 MG tablet Take 4 mg by mouth 3 (three) times daily as needed for muscle spasms.    Yes [provider]  zolpidem (AMBIEN) 10 MG tablet Take 10 mg by mouth at bedtime as needed for sleep.   Yes [provider]  traMADol (ULTRAM) 50 MG  tablet Take 1 tablet (50 mg total) by mouth every 6 (six) hours as needed. Patient not taking: Reported on 02/09/2020 12/12/19 12/11/20  [name], MD    Allergies    Demerol  [meperidine hcl], Gabapentin, Hydroxyzine, Midazolam, Trazodone and nefazodone, Escitalopram oxalate, Penicillins, and Vancomycin  Review of Systems   Review of Systems  Constitutional: Negative.   HENT: Negative.   Respiratory: Negative.   Cardiovascular: Negative.   Gastrointestinal: Positive for abdominal pain. Negative for abdominal distention, anal bleeding, blood in stool, constipation, diarrhea, nausea, rectal pain and vomiting.  Genitourinary: Negative.   Musculoskeletal: Negative.   Skin: Positive for wound.  Neurological: Negative.   All other systems reviewed and are negative.   Physical Exam Updated Vital Signs BP (!) 144/83   Pulse 80   Temp 98.3 F (36.8 C) (Oral)   Resp 20   SpO2 100%   Physical Exam Vitals and nursing note reviewed.  Constitutional:      General: She is not in acute distress.    Appearance: She is well-developed. She is not ill-appearing, toxic-appearing or diaphoretic.  HENT:     Head: Normocephalic and atraumatic.     Nose: Nose  normal.     Mouth/Throat:     Mouth: Mucous membranes are moist.  Eyes:     Pupils: Pupils are equal, round, and reactive to light.  Cardiovascular:     Rate and Rhythm: Normal rate.     Pulses: Normal pulses.     Heart sounds: Normal heart sounds.  Pulmonary:     Effort: Pulmonary effort is normal. No respiratory distress.     Breath sounds: Normal breath sounds.  Abdominal:     General: Bowel sounds are normal. There is no distension.     Palpations: Abdomen is soft.     Tenderness: There is abdominal tenderness in the right upper quadrant. There is no right CVA tenderness, left CVA tenderness, guarding or rebound. Negative signs include McBurney's sign.     Hernia: No hernia is present.     Comments: Cellulitic changes to right upper abdomen with pustule formation.  There is 6 cm rounded area of induration inferior to port site.  Stimulator to right lower abdomen.  Musculoskeletal:        General: Normal range of motion.     Cervical back: Normal range of motion.     Comments: Compartment soft.  No bony tenderness.  Moves all 4 extremities without difficulty.  Skin:    General: Skin is warm and dry.     Capillary Refill: Capillary refill takes less than 2 seconds.     Comments: Cellulitis with pustule formation to right upper quadrant with abscess.  Erythema to bilateral upper and lower extremities which patient states is chronic at baseline  Neurological:     Mental Status: She is alert.       ED Results / Procedures / Treatments   Labs (all labs ordered are listed, but only abnormal results are displayed) Labs Reviewed  COMPREHENSIVE METABOLIC PANEL - Abnormal; Notable for the following components:      Result Value   Sodium 134 (*)    Glucose, Bld 105 (*)    BUN 25 (*)    Creatinine, Ser 1.35 (*)    Alkaline Phosphatase 137 (*)    GFR, Estimated 42 (*)    All other components within normal limits  CBC WITH DIFFERENTIAL/PLATELET - Abnormal; Notable for the  following components:   Hemoglobin 11.2 (*)  MCH 24.2 (*)    MCHC 28.8 (*)    RDW 16.3 (*)    Platelets 402 (*)    All other components within normal limits  RESPIRATORY PANEL BY RT PCR (FLU A&B, COVID)    EKG None  Radiology No results found.  CLINICAL DATA:  74 year old presenting with cellulitis involving the RIGHT LOWER abdomen. Surgical history includes cholecystectomy.  EXAM: CT ABDOMEN AND PELVIS WITH CONTRAST  TECHNIQUE: Multidetector CT imaging of the abdomen and pelvis was performed using the standard protocol following bolus administration of intravenous contrast.  CONTRAST:  147mL OMNIPAQUE IOHEXOL 300 MG/ML IV.  COMPARISON:  12/20/2019.  FINDINGS: Lower chest: Respiratory motion blurred images of the lung bases. Visualized lung bases clear. Normal heart size. RIGHT coronary artery atherosclerosis. Mitral valve annuloplasty.  Hepatobiliary: Liver normal in size and appearance. Surgically absent gallbladder. No unexpected biliary ductal dilation.  Pancreas: Normal in appearance without evidence of mass, ductal dilation, or inflammation.  Spleen: Approximate 8 mm low-attenuation lesion posteriorly in the LOWER pole, statistically a benign cyst or hemangioma. Otherwise normal appearance.  Adrenals/Urinary Tract: Normal appearing adrenal glands. Kidneys normal in size and appearance without focal parenchymal abnormality. No hydronephrosis. No evidence of urinary tract calculi.  Stomach/Bowel: Stomach normal in appearance for the degree of distention. Normal-appearing small bowel. Descending and sigmoid colon diverticulosis without evidence of acute diverticulitis. Remainder of the colon unremarkable. Appendix not conspicuous, but no pericecal inflammation.  Vascular/Lymphatic: Mild aorto-iliofemoral atherosclerosis without evidence of aneurysm. Normal-appearing portal venous and systemic venous systems.  No pathologic  lymphadenopathy.  Reproductive: Uterus not conspicuous and presumed surgically absent; if no prior hysterectomy, then the uterus is markedly atrophic. No adnexal masses.  Other: Fluid collection with an enhancing rim involving the RIGHT oblique abdominal muscles, with the fluid collection having multiple locules and extending to the skin surface, measuring in total approximately 4.7 x 4.0 x 7.0 cm.  Battery generator pack for DORSAL spinal cord stimulators in the bilateral buttocks. Port for an intraspinal catheter in the LEFT ANTERIOR abdominal wall.  Musculoskeletal: DORSAL cord stimulator device over the mid and lower thoracic spine. Intraspinal catheter. Degenerative disc disease at L5-S1. 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 No acute findings.  IMPRESSION: 1. Abscess involving the RIGHT oblique abdominal muscles extending to the skin surface, measured above. The abscess has multiple locules. 2. Descending and sigmoid colon diverticulosis without evidence of acute diverticulitis. 3. 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  Aortic Atherosclerosis (ICD10-I70.0).  Procedures Procedures (including critical care time)  Medications Ordered in ED Medications  linezolid (ZYVOX) IVPB 600 mg (has no administration in time range)  sodium chloride 0.9 % bolus 1,000 mL (0 mLs Intravenous Stopped 02/09/20 1627)  metroNIDAZOLE (FLAGYL) tablet 500 mg (500 mg Oral Given 02/09/20 1613)  ciprofloxacin (CIPRO) IVPB 400 mg (0 mg Intravenous Stopped 02/09/20 1627)    ED Course  I have reviewed the triage vital signs and the nursing notes.  Pertinent labs & imaging results that were available during my care of the patient were reviewed by me and considered in my medical decision making (see chart for details).   74 year old presents for evaluation of abscess and cellulitis to right upper abdominal wall.  She is a patient of [employer] surgery.  Had outpatient imaging on  02/06/2020 which showed skin cellulitis as well as abscess extending from skin surface into her right abdominal oblique muscles.  Had fever yesterday however she is afebrile here on evaluation.  Does have significant cellulitis with palpable 8 cm,  rounded abscess with overlying tenderness.   Labs obtained from triage which I personally reviewed and assessed:  CBC without leukocytosis, hemoglobin 24.2 Metabolic panel with mild hyponatremia 134, BUN 25, creatinine 1.35, alk phos 137 COVID negative EKG without ischemia  CT AP from 11/11 with cellulitis and abscess to intraabdominal muscles extending into skin.  CONSULT with Dr. Harlow Asa with [facility]. recommends medicine admit.  Patient has significant medication allergies to antibiotics.  Recommends consulting with pharmacy for medications to cover for cellulitis as well as abscess.  CONSULT with [facility] Pharmacist.  Will review patient's allergies and call back with recommendations.  Pharmacy has recommended Linezolid IV, Cipro IV, Flagyl PO given extreme IV shortage. She will placed orders  CONSULT with Dr. Neysa Bonito with [facility] who will evaluate patient for admission  Patient does not appear septic or systemically ill at this time.  Will admit to hospitalist service with general surgery consulting for further management of her cellulitis and abscess.  Patient seen and eval by attending, Dr. Zenia Resides who agrees above treatment, plan disposition.  The patient appears reasonably stabilized for admission considering the current resources, flow, and capabilities available in the ED at this time, and I doubt any other [facility] requiring further screening and/or treatment in the ED prior to admission.   MDM Rules/Calculators/A&P                           Final Clinical Impression(s) / ED Diagnoses Final diagnoses:  Postprocedural intraabdominal abscess  Cellulitis of abdominal wall    Rx / DC Orders ED Discharge Orders    None       Blaze Nylund A,  PA-C 02/09/20 1630    Lacretia Leigh, MD 02/14/20 1349

## 2020-02-09 NOTE — H&P (Signed)
History and Physical        Hospital Admission Note Date: 02/09/2020  Patient name: Kelly Curtis Medical record number: [id number] Date of birth: [birth year] Age: 74 y.o. Gender: female  PCP: [name], MD  Patient coming from: home   Chief Complaint    Chief Complaint  Patient presents with  . Abscess      HPI:   This is a 74 y.o. 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  Patient had a lap chole in September by Dr. [name] and states that about 2 weeks ago she had abdominal wall erythema near her RUQ operative site with pain.  She had a CT abdomen pelvis with contrast on 02/06/2020 which showed an abscess of the right oblique abdominal muscles extending to the skin surface but states that she was told she had postoperative fluid which should resolve on its own.  The erythema became worse and painful with an increase in swelling and she had a fever yesterday up to 20 F which she states is high for her (temperature typically runs low at 96 F).  Today she states she feels "like crap."  No nausea or vomiting, no diarrhea but has chronic constipation from her opiates.  ED Course: Afebrile, hemodynamically stable, on room air. Notable Labs: BUN 25, creatinine 1.35 (baseline 0.9), alkaline phosphatase 137, WBC 7.6, Hb 11.2, platelets 402. Notable Imaging: As above. Patient has multiple medical allergies and pharmacy was consulted and recommended linezolid.  General surgery was consulted by the ED who will see the patient in consult and recommended n.p.o. after midnight.    Vitals:   02/09/20 1500 02/09/20 1612  BP: 134/85 (!) 144/83  Pulse: 82 80  Resp: 20 20    Temp:  98.2 F (36.8 C)  SpO2: 99% 100%     Review of Systems:  Review of Systems  All other systems reviewed and are negative.   Medical/Social/Family History   Past Medical History: Past Medical History:  Diagnosis Date  . Arthritis   . Cancer ([place])    skin cancer x 3  . Chronic kidney disease   . Constipation due to pain medication   . Depression with anxiety   . DVT (deep venous thrombosis) [facility])    age 7ish  . Dysrhythmia    PVCs; perioperative PAF with MV repair ~ 2000   . 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 GERD (gastroesophageal reflux disease)   . Headache    migraine heads- 4 times a year  . History of blood transfusion   . Hypotension   . 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 AUGMENTATION MAMMAPLASTY Bilateral   . CHOLECYSTECTOMY N/A 12/12/2019   Procedure: LAPAROSCOPIC CHOLECYSTECTOMY;  Surgeon: [name], MD;  Location: [place];  Service: General;  Laterality: N/A;  . COLONOSCOPY    . ENDOSCOPIC RETROGRADE CHOLANGIOPANCREATOGRAPHY (ERCP) WITH PROPOFOL N/A 09/17/2019   Procedure: ENDOSCOPIC  RETROGRADE CHOLANGIOPANCREATOGRAPHY (ERCP) WITH PROPOFOL;  Surgeon: Clarene Essex, MD;  Location: [facility];  Service: Endoscopy;  Laterality: N/A;  . ESOPHAGOGASTRODUODENOSCOPY (EGD) WITH PROPOFOL N/A 09/06/2019   Procedure: ESOPHAGOGASTRODUODENOSCOPY (EGD) WITH PROPOFOL;  Surgeon: Ronnette Juniper, MD;  Location: [facility];  Service: Gastroenterology;  Laterality: N/A;  . EYE SURGERY Bilateral    cataract  . MITRAL VALVE REPAIR    . REMOVAL OF STONES  09/17/2019   Procedure: REMOVAL OF STONES;  Surgeon: Clarene Essex, MD;  Location: [facility];  Service: Endoscopy;;  . [name]  09/17/2019   Procedure: [name];  Surgeon: Clarene Essex, MD;  Location: [facility];  Service: Endoscopy;;  . TOTAL SHOULDER ARTHROPLASTY Right 2013     Medications: Prior to Admission medications   Medication Sig Start Date End Date Taking? Authorizing Provider  acetaminophen (TYLENOL) 500 MG tablet Take 1,000 mg by mouth every 6 (six) hours as needed for moderate pain.   Yes [provider]  aspirin EC 81 MG tablet Take 81 mg by mouth daily.   Yes [provider]  buPROPion (WELLBUTRIN XL) 150 MG 24 hr tablet Take 150 mg by mouth daily.    Yes [provider]  dimenhyDRINATE (DRAMAMINE) 50 MG tablet Take 50 mg by mouth every 8 (eight) hours as needed for nausea or dizziness.   Yes [provider]  DULoxetine (CYMBALTA) 30 MG capsule Take 30 mg by mouth 2 (two) times daily.    Yes [provider]  DULoxetine HCl 40 MG CPEP Take 40 mg by mouth daily.   Yes [provider]  HYDROcodone-acetaminophen (NORCO/VICODIN) 5-325 MG tablet Take 1 tablet by mouth every 6 (six) hours as needed for moderate pain.    Yes [provider]  levETIRAcetam (KEPPRA) 250 MG tablet Take 250 mg by mouth 2 (two) times daily.  12/17/19 06/14/20 Yes [provider]  methadone (DOLOPHINE) 10 MG tablet Take 10 mg by mouth every 8 (eight) hours.    Yes [provider]  metoprolol succinate (TOPROL-XL) 25 MG 24 hr tablet Take 12.5 mg by mouth daily.   Yes [provider]  mexiletine (MEXITIL) 150 MG capsule Take 150 mg by mouth every other day.  02/05/20 06/04/20 Yes [provider]  midodrine (PROAMATINE) 5 MG tablet Take 5 mg by mouth daily.  12/29/19  Yes [provider]  naloxone (NARCAN) nasal spray 4 mg/0.1 mL Place 1 spray into the nose once as needed (opiod overdose).   Yes [provider]  naproxen (NAPROSYN) 500 MG tablet Take 500 mg by mouth 2 (two) times daily as needed for moderate pain.    Yes [provider]  omeprazole (PRILOSEC) 20 MG capsule Take 20 mg by mouth daily.   Yes [provider]  PARoxetine (PAXIL) 10 MG tablet  Take 10 mg by mouth daily.    Yes [provider]  Pediatric Multivitamins-Iron (FLINTSTONES PLUS IRON PO) Take 1 tablet by mouth daily.   Yes [provider]  polyethylene glycol (MIRALAX / GLYCOLAX) 17 g packet Take 17 g by mouth daily.   Yes [provider]  polyvinyl alcohol (ARTIFICIAL TEARS) 1.4 % ophthalmic solution Place 1 drop into both eyes 3 (three) times daily as needed for dry eyes.    Yes [provider]  tiZANidine (ZANAFLEX) 4 MG tablet Take 4 mg by mouth 3 (three) times daily as needed for muscle spasms.    Yes [provider]  zolpidem (AMBIEN) 10 MG tablet Take 10  mg by mouth at bedtime as needed for sleep.   Yes [provider]  traMADol (ULTRAM) 50 MG tablet Take 1 tablet (50 mg total) by mouth every 6 (six) hours as needed. Patient not taking: Reported on 02/09/2020 12/12/19 12/11/20  [name], MD    Allergies:   Allergies  Allergen Reactions  . Demerol  [Meperidine Hcl] Nausea And Vomiting  . Gabapentin Other (See Comments)    Suicidal ideation (03/23/18 - pt currently is getting small amount in pain pump with no reaction)  . Hydroxyzine Other (See Comments)    hallucinations  . Midazolam Other (See Comments)    Bradycardia  . Trazodone And Nefazodone Other (See Comments)    dizziness  . Escitalopram Oxalate Other (See Comments)    confusion  . Penicillins Rash    DID THE REACTION INVOLVE: Swelling of the face/tongue/throat, SOB, or low BP? No Sudden or severe rash/hives, skin peeling, or the inside of the mouth or nose? Yes Did it require medical treatment? Yes When did it last happen?74 years old If all above answers are "NO", may proceed with cephalosporin use.  . Vancomycin Swelling and Rash    Social History:  reports that she quit smoking about 21 years ago. Her smoking use included cigarettes. She quit after 32.00 years of use. She has never used smokeless tobacco. She reports that she  does not drink alcohol and does not use drugs.  Family History: Family History  Problem Relation Age of Onset  . Heart disease Mother   . Alzheimer's disease Mother   . Kidney disease Father   . Melanoma Sister   . Heart disease Brother      Objective   Physical Exam: Blood pressure (!) 144/83, pulse 80, temperature 98.2 F (36.8 C), resp. rate 20, SpO2 100 %.  Physical Exam Vitals and nursing note reviewed.  Constitutional:      Appearance: Normal appearance.  HENT:     Head: Normocephalic and atraumatic.  Eyes:     Conjunctiva/sclera: Conjunctivae normal.  Cardiovascular:     Rate and Rhythm: Normal rate and regular rhythm.  Pulmonary:     Effort: Pulmonary effort is normal.     Breath sounds: Normal breath sounds.  Abdominal:     General: Abdomen is flat.     Palpations: Abdomen is soft.     Comments: RUQ non draining purulent abscess (see picture below obtained by ED provider)  Musculoskeletal:        General: No swelling.     Comments: Bilateral hand and foot erythema (chronic)  Skin:    Coloration: Skin is not jaundiced or pale.  Neurological:     Mental Status: She is alert. Mental status is at baseline.  Psychiatric:        Mood and Affect: Mood normal.        Behavior: Behavior normal.        LABS on Admission: I have personally reviewed all the labs and imaging below    Basic Metabolic Panel: Recent Labs  Lab 02/06/20 1444 02/09/20 1254  NA  --  134*  K  --  3.7  CL  --  98  CO2  --  30  GLUCOSE  --  105*  BUN  --  25*  CREATININE 1.00 1.35*  CALCIUM  --  9.2   Liver Function Tests: Recent Labs  Lab 02/09/20 1254  AST 15  ALT 13  ALKPHOS 137*  BILITOT 0.5  PROT 7.6  ALBUMIN 3.8   No results for input(s): LIPASE, AMYLASE in the last 168 hours. No results for input(s): AMMONIA in the last 168 hours. CBC: Recent Labs  Lab 02/09/20 1254  WBC 7.6  NEUTROABS 5.8  HGB 11.2*  HCT 38.9  MCV 84.2  PLT 402*   Cardiac  Enzymes: No results for input(s): CKTOTAL, CKMB, CKMBINDEX, TROPONINI in the last 168 hours. BNP: Invalid input(s): POCBNP CBG: No results for input(s): GLUCAP in the last 168 hours.  Radiological Exams on Admission:  No results found.    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 RUQ abdominal wall abscess with surrounding cellulitis with recent history of laparoscopic cholecystectomy (12/12/2019) a. Afebrile and without leukocytosis b. Multiple antibiotic allergies, will continue with linezolid per pharmacy recommendations c. CT abdomen pelvis with contrast on 02/06/2020 which showed an abscess of the right oblique abdominal muscles extending to the skin surface d. General surgery consulted e. N.p.o. after midnight for probable procedure tomorrow  2. AKI on CKD 3 a a. Received IV fluids in the ED, follow-up in a.m.  3. 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 Has intrathecal pump b. Continue home methadone, Norco, Keppra, Cymbalta, Zanaflex and Tylenol  4. Anxiety and depression a. Continue home Wellbutrin, Cymbalta, Paxil  5. Chronic hypotension, currently hypertensive a. Hold home midodrine  6. Hypertension a. Continue home Toprol XL  7. Chronic Diastolic Heart Failure, not in exacerbation    DVT prophylaxis: enoxaparin (LOVENOX) injection 40 mg Start: 02/09/20 1645     Code Status: DNR  Diet: Heart healthy, n.p.o. after midnight Family Communication: Admission, patients condition and plan of care including tests being ordered have been discussed with the patient who indicates understanding and agrees with the plan and Code Status. Patient's sisterThe appropriate patient status for this patient is INPATIENT. Inpatient status is judged to be reasonable  and necessary in order to provide the required intensity of service to ensure the patient's safety. The patient's presenting symptoms, physical exam findings, and initial radiographic and laboratory data in the context of their chronic comorbidities is felt to place them at high risk for further clinical deterioration. Furthermore, it is not anticipated that the patient will be medically stable for discharge from the hospital within 2 midnights of admission. The following factors support the patient status of inpatient.   " The patient's presenting symptoms include RUQ abdominal pain and abscess. " The worrisome physical exam findings include none draining, purulent abscess. " The initial radiographic and laboratory data are worrisome because of abnormal CT findings as above. " The chronic co-morbidities include erythromelalgia on chronic pain meds.   * I certify that at the point of admission it is my clinical judgment that the patient will require inpatient hospital care spanning beyond 2 midnights from the point of admission due to high intensity of service, high risk for further deterioration and high frequency of surveillance required.*   Status is: Inpatient  Remains inpatient appropriate because:Ongoing active pain requiring inpatient pain management, Ongoing diagnostic testing needed not appropriate for outpatient work up and IV treatments appropriate due to intensity of illness or inability to take PO   Dispo: The patient is from: Home  Anticipated d/c is to: Home              Anticipated d/c date is: 3 days              Patient currently is not medically stable to d/c.  Consultants  . General surgery  Procedures  . None  Time Spent on Admission: 64 minutes    Harold Hedge, DO [facility]  02/09/2020, 4:48 PM

## 2020-02-09 NOTE — ED Provider Notes (Signed)
Medical screening examination/treatment/procedure(s) were conducted as a shared visit with non-physician practitioner(s) and myself.  I personally evaluated the patient during the encounter.    74 year old female here complaining of pain and swelling to her right upper quadrant where she had a recent lap chole.  CT scan done prior showed evidence of infection with extension down abdominal wall.  Will consult general surgery   Lacretia Leigh, MD 02/09/20 1455

## 2020-02-09 NOTE — ED Notes (Signed)
Visitor to room  

## 2020-02-09 NOTE — Plan of Care (Signed)
  Problem: Education: Goal: Knowledge of General Education information will improve Description Including pain rating scale, medication(s)/side effects and non-pharmacologic comfort measures Outcome: Progressing   Problem: Clinical Measurements: Goal: Ability to maintain clinical measurements within normal limits will improve Outcome: Progressing   Problem: Activity: Goal: Risk for activity intolerance will decrease Outcome: Progressing   

## 2020-02-09 NOTE — ED Notes (Signed)
Asked pharmacy tech to complete home medication

## 2020-02-09 NOTE — ED Notes (Signed)
Attempted to call report @ [phone]

## 2020-02-09 NOTE — ED Notes (Signed)
Pt placed on purewick at 70 mmHg. 

## 2020-02-09 NOTE — ED Triage Notes (Signed)
Patient reports to the ER for an abscess at her incision site where she had her gallbladder removed. Patient has an obvious area of abscess that is tight and has purulent head.

## 2020-02-10 ENCOUNTER — Encounter (HOSPITAL_COMMUNITY): Payer: Self-pay | Admitting: Internal Medicine

## 2020-02-10 ENCOUNTER — Encounter (HOSPITAL_COMMUNITY)
Admission: EM | Disposition: A | Discharge status: Home / Self Care | Payer: Self-pay | Source: Home / Self Care | Attending: Internal Medicine

## 2020-02-10 ENCOUNTER — Inpatient Hospital Stay (HOSPITAL_COMMUNITY): Payer: Medicare Other | Admitting: Certified Registered Nurse Anesthetist

## 2020-02-10 DIAGNOSIS — L02211 Cutaneous abscess of abdominal wall: Secondary | ICD-10-CM

## 2020-02-10 HISTORY — PX: IRRIGATION AND DEBRIDEMENT ABSCESS: SHX5252

## 2020-02-10 SURGERY — IRRIGATION AND DEBRIDEMENT ABSCESS
Anesthesia: General | Site: Abdomen

## 2020-02-10 MED ORDER — FENTANYL CITRATE (PF) 100 MCG/2ML IJ SOLN
INTRAMUSCULAR | Status: AC
  Filled 2020-02-10: qty 2

## 2020-02-10 MED ORDER — MORPHINE SULFATE (PF) 2 MG/ML IV SOLN
2.0000 mg | Freq: Once | INTRAVENOUS | Status: AC
  Administered 2020-02-10: 2 mg via INTRAVENOUS
  Filled 2020-02-10: qty 1

## 2020-02-10 MED ORDER — PROPOFOL 500 MG/50ML IV EMUL
INTRAVENOUS | Status: DC | PRN
  Administered 2020-02-10: 150 ug/kg/min via INTRAVENOUS

## 2020-02-10 MED ORDER — LACTATED RINGERS IV SOLN: INTRAVENOUS | Status: DC

## 2020-02-10 MED ORDER — ONDANSETRON HCL 4 MG/2ML IJ SOLN
INTRAMUSCULAR | Status: AC
  Filled 2020-02-10: qty 2

## 2020-02-10 MED ORDER — FENTANYL CITRATE (PF) 100 MCG/2ML IJ SOLN
INTRAMUSCULAR | Status: DC | PRN
  Administered 2020-02-10 (×2): 50 ug via INTRAVENOUS

## 2020-02-10 MED ORDER — ZOLPIDEM TARTRATE 5 MG PO TABS
5.0000 mg | ORAL_TABLET | Freq: Every evening | ORAL | Status: DC | PRN
  Administered 2020-02-10: 5 mg via ORAL
  Filled 2020-02-10: qty 1

## 2020-02-10 MED ORDER — FENTANYL CITRATE (PF) 100 MCG/2ML IJ SOLN
INTRAMUSCULAR | Status: AC
  Administered 2020-02-10: 25 ug via INTRAVENOUS
  Filled 2020-02-10: qty 2

## 2020-02-10 MED ORDER — PROMETHAZINE HCL 25 MG/ML IJ SOLN
6.2500 mg | Freq: Four times a day (QID) | INTRAMUSCULAR | Status: DC | PRN
  Administered 2020-02-10 – 2020-02-11 (×2): 6.25 mg via INTRAVENOUS
  Filled 2020-02-10 (×3): qty 1

## 2020-02-10 MED ORDER — PHENYLEPHRINE 40 MCG/ML (10ML) SYRINGE FOR IV PUSH (FOR BLOOD PRESSURE SUPPORT)
PREFILLED_SYRINGE | INTRAVENOUS | Status: DC | PRN
  Administered 2020-02-10: 80 ug via INTRAVENOUS

## 2020-02-10 MED ORDER — DEXAMETHASONE SODIUM PHOSPHATE 10 MG/ML IJ SOLN
INTRAMUSCULAR | Status: DC | PRN
  Administered 2020-02-10: 6 mg via INTRAVENOUS

## 2020-02-10 MED ORDER — FENTANYL CITRATE (PF) 100 MCG/2ML IJ SOLN
25.0000 ug | INTRAMUSCULAR | Status: DC | PRN
  Administered 2020-02-10 (×2): 25 ug via INTRAVENOUS

## 2020-02-10 MED ORDER — DULOXETINE HCL 20 MG PO CPEP
40.0000 mg | ORAL_CAPSULE | Freq: Every day | ORAL | Status: DC
  Administered 2020-02-10 – 2020-02-11 (×2): 40 mg via ORAL
  Filled 2020-02-10 (×2): qty 2

## 2020-02-10 MED ORDER — ONDANSETRON HCL 4 MG/2ML IJ SOLN
4.0000 mg | Freq: Four times a day (QID) | INTRAMUSCULAR | Status: DC | PRN
  Administered 2020-02-10: 4 mg via INTRAVENOUS
  Filled 2020-02-10: qty 2

## 2020-02-10 MED ORDER — ADULT MULTIVITAMIN W/MINERALS CH
1.0000 | ORAL_TABLET | Freq: Every day | ORAL | Status: DC
  Administered 2020-02-11: 1 via ORAL
  Filled 2020-02-10: qty 1

## 2020-02-10 MED ORDER — ENSURE SURGERY PO LIQD
237.0000 mL | Freq: Two times a day (BID) | ORAL | Status: DC
  Filled 2020-02-10 (×3): qty 237

## 2020-02-10 MED ORDER — PROPOFOL 10 MG/ML IV BOLUS
INTRAVENOUS | Status: DC | PRN
  Administered 2020-02-10: 150 mg via INTRAVENOUS

## 2020-02-10 MED ORDER — LIDOCAINE 2% (20 MG/ML) 5 ML SYRINGE
INTRAMUSCULAR | Status: DC | PRN
  Administered 2020-02-10: 40 mg via INTRAVENOUS

## 2020-02-10 MED ORDER — PROPOFOL 10 MG/ML IV BOLUS
INTRAVENOUS | Status: AC
  Filled 2020-02-10: qty 20

## 2020-02-10 MED ORDER — HYDROMORPHONE HCL 1 MG/ML IJ SOLN
0.5000 mg | INTRAMUSCULAR | Status: AC | PRN
  Administered 2020-02-10 (×3): 1 mg via INTRAVENOUS
  Filled 2020-02-10 (×3): qty 1

## 2020-02-10 MED ORDER — ONDANSETRON HCL 4 MG/2ML IJ SOLN
INTRAMUSCULAR | Status: DC | PRN
  Administered 2020-02-10: 4 mg via INTRAVENOUS

## 2020-02-10 SURGICAL SUPPLY — 28 items
BLADE SURG SZ11 CARB STEEL (BLADE) ×2 IMPLANT
CHLORAPREP W/TINT 26 (MISCELLANEOUS) ×2 IMPLANT
COVER SURGICAL LIGHT HANDLE (MISCELLANEOUS) ×2 IMPLANT
COVER WAND RF STERILE (DRAPES) IMPLANT
DECANTER SPIKE VIAL GLASS SM (MISCELLANEOUS) IMPLANT
DERMABOND ADVANCED (GAUZE/BANDAGES/DRESSINGS) ×1
DERMABOND ADVANCED .7 DNX12 (GAUZE/BANDAGES/DRESSINGS) ×1 IMPLANT
DRAPE LAPAROSCOPIC ABDOMINAL (DRAPES) IMPLANT
DRAPE LAPAROTOMY TRNSV 102X78 (DRAPES) IMPLANT
DRSG PAD ABDOMINAL 8X10 ST (GAUZE/BANDAGES/DRESSINGS) ×2 IMPLANT
ELECT REM PT RETURN 15FT ADLT (MISCELLANEOUS) ×2 IMPLANT
GAUZE PACKING IODOFORM 1/2 (PACKING) ×2 IMPLANT
GAUZE SPONGE 4X4 12PLY STRL (GAUZE/BANDAGES/DRESSINGS) ×2 IMPLANT
GLOVE BIO SURGEON STRL SZ 6 (GLOVE) ×2 IMPLANT
GLOVE INDICATOR 6.5 STRL GRN (GLOVE) ×2 IMPLANT
GLOVE SURG SS PI 7.0 STRL IVOR (GLOVE) ×2 IMPLANT
GOWN STRL REUS W/TWL LRG LVL3 (GOWN DISPOSABLE) ×2 IMPLANT
GOWN STRL REUS W/TWL XL LVL3 (GOWN DISPOSABLE) ×2 IMPLANT
KIT BASIN OR (CUSTOM PROCEDURE TRAY) ×2 IMPLANT
KIT TURNOVER KIT A (KITS) IMPLANT
PACK GENERAL/GYN (CUSTOM PROCEDURE TRAY) ×2 IMPLANT
PENCIL SMOKE EVACUATOR (MISCELLANEOUS) IMPLANT
SURGILUBE 2OZ TUBE FLIPTOP (MISCELLANEOUS) IMPLANT
SUT MNCRL AB 4-0 PS2 18 (SUTURE) IMPLANT
SWAB COLLECTION DEVICE MRSA (MISCELLANEOUS) IMPLANT
SWAB CULTURE ESWAB REG 1ML (MISCELLANEOUS) IMPLANT
TOWEL OR 17X26 10 PK STRL BLUE (TOWEL DISPOSABLE) ×2 IMPLANT
TOWEL OR NON WOVEN STRL DISP B (DISPOSABLE) ×2 IMPLANT

## 2020-02-10 NOTE — Op Note (Signed)
Operative Note  Kelly Curtis  [id number]  [id number]  02/10/2020   Surgeon: Vikki Ports A ConnorMD  Procedure performed: Incision and debridement of abdominal wall abscess, 3 x 5 cm including skin, subcutaneous tissue, fascia and muscle  Preop diagnosis: Abdominal wall abscess Post-op diagnosis/intraop findings: Same  Specimens: Cultures Retained items: Half-inch iodoform packing EBL: 0 cc Complications: none  Description of procedure: After obtaining informed consent the patient was taken to the operating room and placed supine on operating room table wheregeneral LMA anesthesia was initiated, preoperative antibiotics were administered, SCDs applied, and a formal timeout was performed.  The abdomen was prepped and draped in usual sterile fashion.  The area of the abscess was incised with a 15 blade and cultures obtained.  The wound was probed with a hemostat and then digitally and the layers of the abdominal wall were able to be palpated confirming that all layers of the abscess noted on CT were evacuated.  Foot purulent fluid was present.  There did not appear to be any violation of the posterior fascia or peritoneal cavity.  The wound was bluntly probed with my finger and then irrigated with warm sterile saline.  Half-inch iodoform packing was placed in each layer of the wound and then a dry dressing of gauze and tape was applied.  The patient was then awakened, extubated and taken to PACU in stable condition.   All counts were correct at the completion of the case.

## 2020-02-10 NOTE — Anesthesia Postprocedure Evaluation (Signed)
Anesthesia Post Note  Patient: Kelly Curtis  Procedure(s) Performed: IRRIGATION AND DEBRIDEMENT ABSCESS (N/A Abdomen)     Patient location during evaluation: PACU Anesthesia Type: General Level of consciousness: awake and alert Pain management: pain level controlled Vital Signs Assessment: post-procedure vital signs reviewed and stable Respiratory status: spontaneous breathing, nonlabored ventilation, respiratory function stable and patient connected to nasal cannula oxygen Cardiovascular status: blood pressure returned to baseline and stable Postop Assessment: no apparent nausea or vomiting Anesthetic complications: no   No complications documented.  Last Vitals:  Vitals:   02/10/20 1115 02/10/20 1130  BP: (!) 151/89 (!) 154/81  Pulse: (!) 59 61  Resp: 19 10  Temp:  (!) 36.4 C  SpO2: 100% 93%    Last Pain:  Vitals:   02/10/20 1200  TempSrc:   PainSc: [place]

## 2020-02-10 NOTE — Progress Notes (Signed)
Initial Nutrition Assessment  INTERVENTION:   -Ensure Surgery BID, each provides 330 kcals and 18g protein -Multivitamin with minerals daily  NUTRITION DIAGNOSIS:   Increased nutrient needs related to post-op healing, wound healing as evidenced by estimated needs.  GOAL:   Patient will meet greater than or equal to 90% of their needs  MONITOR:   PO intake, Supplement acceptance, Labs, Weight trends, I & O's  REASON FOR ASSESSMENT:   Malnutrition Screening Tool    ASSESSMENT:   74 y.o. 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  Patient had a lap chole in September by Dr. [name]  Patient in OR this morning for I&D of abdominal wall abscess. NPO for surgery.  Last night pt consumed 100% of dinner meal prior to being made NPO. Pt has reported poor appetite.  Once diet is advanced, recommend Ensure Surgery and daily MVI to aid in healing.   Per weight records, weight has been stable.   Medications: Lactated ringers Labs reviewed: Low Na  NUTRITION - FOCUSED PHYSICAL EXAM:  In OR  Diet Order:   Diet Order    None      EDUCATION NEEDS:   No education needs have been identified at this time  Skin:  Skin Assessment: Reviewed RN Assessment  Last BM:  PTA  Height:   Ht Readings from Last 1 Encounters:  02/09/20 5\' 4"  (1.626 m)    Weight:   Wt Readings from Last 1 Encounters:  02/09/20 67.1 kg   BMI:  Body mass index is 25.4 kg/m.  Estimated Nutritional Needs:   Kcal:  1650-1850  Protein:  80-90g  Fluid:  1.8L/day  Clayton Bibles, MS, RD, LDN Inpatient Clinical Dietitian Contact information available via Amion

## 2020-02-10 NOTE — Progress Notes (Signed)
   Subjective/Chief Complaint: Feels poorly this morning. Reports decreased appetite.    Objective: Vital signs in last 24 hours: Temp:  [97.8 F (36.6 C)-98.3 F (36.8 C)] 98 F (36.7 C) (11/15 0616) Pulse Rate:  [57-88] 61 (11/15 0616) Resp:  [14-20] 17 (11/15 0616) BP: (118-152)/(62-96) 152/62 (11/15 0616) SpO2:  [96 %-100 %] 96 % (11/15 0616) Weight:  [67.1 kg] 67.1 kg (11/14 1800)    Intake/Output from previous day: 11/14 0701 - 11/15 0700 In: 1760 [P.O.:360; IV Piggyback:1400] Out: -  Intake/Output this shift: No intake/output data recorded.  RUQ abscess with ongoing purulent drainage, ongoing induration and tenderness. Spine stimulator palpable in L abd wall  Lab Results:  Recent Labs    02/09/20 1254  WBC 7.6  HGB 11.2*  HCT 38.9  PLT 402*   BMET Recent Labs    02/09/20 1254  NA 134*  K 3.7  CL 98  CO2 30  GLUCOSE 105*  BUN 25*  CREATININE 1.35*  CALCIUM 9.2   PT/INR No results for input(s): LABPROT, INR in the last 72 hours. ABG No results for input(s): PHART, HCO3 in the last 72 hours.  Invalid input(s): PCO2, PO2  Studies/Results: No results found.  Anti-infectives: Anti-infectives (From admission, onward)   Start     Dose/Rate Route Frequency Ordered Stop   02/10/20 1000  linezolid (ZYVOX) IVPB 600 mg        600 mg 300 mL/hr over 60 Minutes Intravenous Every 12 hours 02/09/20 1745     02/10/20 0600  ciprofloxacin (CIPRO) IVPB 400 mg        400 mg 200 mL/hr over 60 Minutes Intravenous Every 12 hours 02/09/20 1748     02/09/20 2200  metroNIDAZOLE (FLAGYL) tablet 500 mg        500 mg Oral Every 8 hours 02/09/20 1742     02/09/20 1600  linezolid (ZYVOX) IVPB 600 mg        600 mg 300 mL/hr over 60 Minutes Intravenous  Once 02/09/20 1528 02/09/20 2254   02/09/20 1600  ciprofloxacin (CIPRO) IVPB 400 mg        400 mg 200 mL/hr over 60 Minutes Intravenous  Once 02/09/20 1535 02/09/20 1627   02/09/20 1545  metroNIDAZOLE (FLAGYL) tablet  500 mg        500 mg Oral  Once 02/09/20 1530 02/09/20 1613      Assessment/Plan: Abdominal wall abscess s/p lap chole in September. I&D in OR this morning.   LOS: 1 day    Clovis Riley 02/10/2020

## 2020-02-10 NOTE — Transfer of Care (Signed)
Immediate Anesthesia Transfer of Care Note  Patient: Kelly Curtis  Procedure(s) Performed: IRRIGATION AND DEBRIDEMENT ABSCESS (N/A Abdomen)  Patient Location: PACU  Anesthesia Type:General -TIVA  Level of Consciousness: awake, drowsy and patient cooperative  Airway & Oxygen Therapy: Patient Spontanous Breathing and Patient connected to face mask oxygen  Post-op Assessment: Report given to RN and Post -op Vital signs reviewed and stable  Post vital signs: Reviewed and stable  Last Vitals:  Vitals Value Taken Time  BP    Temp    Pulse 64 02/10/20 1035  Resp 15 02/10/20 1035  SpO2 100 % 02/10/20 1035  Vitals shown include unvalidated device data.  Last Pain:  Vitals:   02/10/20 0907  TempSrc:   PainSc: 7       Patients Stated Pain Goal: 6 (41/28/20 8138)  Complications: No complications documented.

## 2020-02-10 NOTE — Anesthesia Procedure Notes (Signed)
Procedure Name: LMA Insertion Date/Time: 02/10/2020 10:03 AM Performed by: West Pugh, CRNA Pre-anesthesia Checklist: Patient identified, Emergency Drugs available, Suction available, Patient being monitored and Timeout performed Patient Re-evaluated:Patient Re-evaluated prior to induction Oxygen Delivery Method: Circle system utilized Preoxygenation: Pre-oxygenation with 100% oxygen Induction Type: IV induction LMA: LMA with gastric port inserted LMA Size: 4.0 Number of attempts: 1 Placement Confirmation: positive ETCO2 and breath sounds checked- equal and bilateral Tube secured with: Tape Dental Injury: Teeth and Oropharynx as per pre-operative assessment

## 2020-02-10 NOTE — Progress Notes (Signed)
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  She had a laparoscopic cholecystectomy September by Dr. [name], felt well afterwards but in the past couple weeks she has noticed wall erythema near her operative site as well as drainage.  She was seen by general surgery as an outpatient, had a CT scan of the abdomen pelvis which showed an abscess at the surgical site.  She was directed to be admitted to the hospital, general surgery consulted  Subjective / 24h Interval events: Complains of right upper quadrant pain this morning, denies any nausea or vomiting.  Reports subjective fever and chills at home  Assessment & Plan: Principal Problem Right upper quadrant abdominal wall abscess with surrounding cellulitis, with recent history of laparoscopic cholecystectomy September 2021 -Nontoxic-appearing, she is afebrile and without leukocytosis.  General surgery consulted and will take patient to the operating room this morning for exploration and drainage. -She has been placed on ciprofloxacin, metronidazole as well as linezolid, continue.  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 aspirin EC  81 mg Oral Daily  . enoxaparin (LOVENOX) injection  40 mg Subcutaneous Q24H  . levETIRAcetam  250 mg Oral BID  . methadone  10 mg Oral Q8H  . metoprolol succinate  12.5 mg Oral Daily  . metroNIDAZOLE  500 mg Oral Q8H  . pantoprazole  40 mg Oral Daily  . polyethylene glycol  17 g Oral Daily  . sodium chloride flush  3 mL Intravenous Q12H   Continuous Infusions: . ciprofloxacin 400 mg (02/10/20 0618)  . linezolid (ZYVOX) IV     PRN Meds:.HYDROcodone-acetaminophen, methocarbamol, naLOXone (NARCAN)  injection, senna-docusate  Diet Orders (From admission, onward)    Start     Ordered   02/10/20 0001  Diet NPO time specified  Diet effective midnight        02/09/20 1944          DVT prophylaxis: enoxaparin (LOVENOX) injection 40 mg Start: 02/09/20 2100 SCD's Start: 02/09/20 1945     Code Status: DNR  Family Communication: No family present at bedside  Status is: Inpatient  Remains inpatient appropriate because:Inpatient level of care appropriate due to severity of illness   Dispo: The patient is from: Home              Anticipated d/c is to: Home              Anticipated d/c date is: 3 days              Patient currently is not medically stable to d/c.  Consultants:  General surgery   Procedures:  None   Microbiology  None   Antimicrobials: Linezolid Ciprofloxacin Metronidazole  Objective: Vitals:   02/09/20 1800 02/09/20 2114 02/10/20 0144 02/10/20 0616  BP:  (!) 142/74 129/71 (!) 152/62  Pulse:  71 (!) 57 61  Resp:  14 16 17   Temp:  98.1 F (36.7 C) 97.8 F (36.6 C) 98 F (36.7 C)  TempSrc:  Oral Oral Oral  SpO2:  98% 98% 96%  Weight: 67.1 kg     Height:  5\' 4"  (1.626 m)      Intake/Output Summary (Last 24 hours) at 02/10/2020 0848 Last data filed at 02/10/2020 0400 Gross per 24 hour  Intake 1760 ml  Output  --  Net 1760 ml   Filed Weights   02/09/20 1800  Weight: 67.1 kg    Examination:  Constitutional: NAD Eyes: no scleral icterus ENMT: Mucous membranes are moist.  Neck: normal, supple Respiratory: clear to auscultation bilaterally, no wheezing, no crackles. Normal respiratory effort. No accessory muscle use.  Cardiovascular: Regular rate and rhythm, faint SEM.  Trace LE edema. Good peripheral pulses Abdomen: Right upper quadrant cellulitic area mildly tender to palpation, bowel sounds positive Musculoskeletal: no clubbing / cyanosis.  Skin: no rashes Neurologic: Nonfocal, equal strength   Data Reviewed: I have independently reviewed following labs and imaging studies   CBC: Recent Labs  Lab 02/09/20 1254  WBC 7.6  NEUTROABS 5.8  HGB 11.2*  HCT 38.9  MCV 84.2  PLT 811*   Basic Metabolic Panel: Recent Labs  Lab 02/06/20 1444 02/09/20 1254  NA  --  134*  K  --  3.7  CL  --  98  CO2  --  30  GLUCOSE  --  105*  BUN  --  25*  CREATININE 1.00 1.35*  CALCIUM  --  9.2   Liver Function Tests: Recent Labs  Lab 02/09/20 1254  AST 15  ALT 13  ALKPHOS 137*  BILITOT 0.5  PROT 7.6  ALBUMIN 3.8   Coagulation Profile: No results for input(s): INR, PROTIME in the last 168 hours. HbA1C: No results for input(s): HGBA1C in the last 72 hours. CBG: No results for input(s): GLUCAP in the last 168 hours.  Recent Results (from the past 240 hour(s))  Respiratory Panel by RT PCR (Flu A&B, Covid) - Nasopharyngeal Swab     Status: None   Collection Time: 02/09/20  2:46 PM   Specimen: Nasopharyngeal Swab  Result Value Ref Range Status   SARS Coronavirus 2 by RT PCR NEGATIVE NEGATIVE Final    Comment: (NOTE) SARS-CoV-2 target nucleic acids are NOT DETECTED.  The SARS-CoV-2 RNA is generally detectable in upper respiratoy specimens during the acute phase of infection. The lowest concentration of SARS-CoV-2 viral copies this assay can detect is 131 copies/mL. A negative  result does not preclude SARS-Cov-2 infection and should not be used as the sole basis for treatment or other patient management decisions. A negative result may occur with  improper specimen collection/handling, submission of specimen other than nasopharyngeal swab, presence of viral mutation(s) within the areas targeted by this assay, and inadequate number of viral copies (<131 copies/mL). A negative result must be combined with clinical observations, patient history, and epidemiological information. The expected result is Negative.  Fact Sheet for Patients:  PinkCheek.be  Fact Sheet for Healthcare Providers:  GravelBags.it  This test is no t yet approved or cleared by the [place] FDA and  has been authorized for detection and/or diagnosis of SARS-CoV-2 by FDA under an Emergency Use Authorization (EUA). This EUA will remain  in effect (meaning this  test can be used) for the duration of the COVID-19 declaration under Section 564(b)(1) of the Act, 21 U.S.C. section 360bbb-3(b)(1), unless the authorization is terminated or revoked sooner.     Influenza A by PCR NEGATIVE NEGATIVE Final   Influenza B by PCR NEGATIVE NEGATIVE Final    Comment: (NOTE) The Xpert Xpress SARS-CoV-2/FLU/RSV assay is intended as an aid in  the diagnosis of influenza from Nasopharyngeal swab specimens and  should not be used as a sole basis for treatment. Nasal washings and  aspirates are unacceptable for Xpert Xpress SARS-CoV-2/FLU/RSV  testing.  Fact Sheet for Patients: PinkCheek.be  Fact Sheet for Healthcare Providers: GravelBags.it  This test is not yet approved or cleared by the [place] FDA and  has been authorized for detection and/or diagnosis of SARS-CoV-2 by  FDA under an Emergency Use Authorization (EUA). This EUA will remain  in effect (meaning this test can be used)  for the duration of the  Covid-19 declaration under Section 564(b)(1) of the Act, 21  U.S.C. section 360bbb-3(b)(1), unless the authorization is  terminated or revoked. Performed at [facility], [place] [address], [place], [place] [address]   Surgical pcr screen     Status: None   Collection Time: 02/09/20 10:05 PM   Specimen: Nasal Mucosa; Nasal Swab  Result Value Ref Range Status   MRSA, PCR NEGATIVE NEGATIVE Final   Staphylococcus aureus NEGATIVE NEGATIVE Final    Comment: (NOTE) The Xpert SA Assay (FDA approved for NASAL specimens in patients 35 years of age and older), is one component of a comprehensive surveillance program. It is not intended to diagnose infection nor to guide or monitor treatment. Performed at [facility], [place] [address], [place], [place] [address]      Radiology Studies: No results found.  Marzetta Board, MD, PhD [facility]  Between 7 am - 7 pm I am available, please contact me via Amion or Securechat  Between 7 pm - 7 am I am not available, please contact night coverage MD/APP via Amion

## 2020-02-10 NOTE — Anesthesia Preprocedure Evaluation (Addendum)
Anesthesia Evaluation  Patient identified by MRN, date of birth, ID band Patient awake    Reviewed: Allergy & Precautions, NPO status , Patient's Chart, lab work & pertinent test results, reviewed documented beta blocker date and time   Airway Mallampati: I  TM Distance: >3 FB Neck ROM: Full    Dental no notable dental hx. (+) Teeth Intact, Dental Advisory Given   Pulmonary neg pulmonary ROS, former smoker,    Pulmonary exam normal breath sounds clear to auscultation       Cardiovascular hypertension, Pt. on home beta blockers and Pt. on medications +CHF and + DVT  Normal cardiovascular examAtrial Fibrillation + Valvular Problems/Murmurs (s/p mitral valve repair 2000)  Rhythm:Regular Rate:Normal  TTE 2019 - Left ventricle: The cavity size was normal. Wall thickness was normal. Systolic function was normal. The estimated ejectionfraction was in the range of 60% to 65%. Wall motion was normal;there were no regional wall motion abnormalities. The study isnot technically sufficient to allow evaluation of LV diastolicfunction.  - Ventricular septum: Septal motion showed paradox. These changes are consistent with a post-thoracotomy state.  - Mitral valve: Prior procedures included surgical repair. An annular ring prosthesis was present. Transvalvular velocity was increased more than expected, due to high cardiac output. Thefindings are consistent with mild to moderate stenosis. There was mild to moderate regurgitation directed centrally. Mean gradient (D): 8 mm Hg. Gradients are measured at a heart rate of 93 bpm and are likely exaggerated due to anemia. Valve area by  continuity equation (using LVOT flow): 1.13 cm^2.    Neuro/Psych  Headaches, PSYCHIATRIC DISORDERS Anxiety Depression    GI/Hepatic Neg liver ROS, GERD  Medicated,  Endo/Other  negative endocrine ROS  Renal/GU Renal InsufficiencyRenal disease  negative genitourinary    Musculoskeletal  (+) Arthritis , Chronic pain: on norco, methadone, spinal cord stimulator and intrathecal pump with clonidine and gabapentin    Abdominal   Peds  Hematology negative hematology ROS (+) anemia ,   Anesthesia Other Findings RUQ abdominal wall abscess with recent history of laparoscopic cholecystectomy (12/12/2019)  Reproductive/Obstetrics                            Anesthesia Physical  Anesthesia Plan  ASA: III  Anesthesia Plan: General   Post-op Pain Management:    Induction: Intravenous  PONV Risk Score and Plan: 3 and Ondansetron, Treatment may vary due to age or medical condition, Dexamethasone and Midazolam  Airway Management Planned: LMA  Additional Equipment:   Intra-op Plan:   Post-operative Plan: Extubation in OR  Informed Consent: I have reviewed the patients History and Physical, chart, labs and discussed the procedure including the risks, benefits and alternatives for the proposed anesthesia with the patient or authorized representative who has indicated his/her understanding and acceptance.   Patient has DNR.  Discussed DNR with patient and Suspend DNR.   Dental advisory given  Plan Discussed with: CRNA  Anesthesia Plan Comments:        Anesthesia Quick Evaluation

## 2020-02-11 ENCOUNTER — Other Ambulatory Visit (HOSPITAL_COMMUNITY): Payer: Self-pay | Admitting: Internal Medicine

## 2020-02-11 ENCOUNTER — Encounter (HOSPITAL_COMMUNITY): Payer: Self-pay | Admitting: Surgery

## 2020-02-11 DIAGNOSIS — N179 Acute kidney failure, unspecified: Secondary | ICD-10-CM

## 2020-02-11 DIAGNOSIS — G8929 Other chronic pain: Secondary | ICD-10-CM | POA: Diagnosis not present

## 2020-02-11 DIAGNOSIS — L03311 Cellulitis of abdominal wall: Secondary | ICD-10-CM

## 2020-02-11 DIAGNOSIS — L02211 Cutaneous abscess of abdominal wall: Secondary | ICD-10-CM | POA: Diagnosis not present

## 2020-02-11 LAB — BASIC METABOLIC PANEL
Anion gap: 9 (ref 5–15)
BUN: 20 mg/dL (ref 8–23)
CO2: 28 mmol/L (ref 22–32)
Calcium: 9.4 mg/dL (ref 8.9–10.3)
Chloride: 100 mmol/L (ref 98–111)
Creatinine, Ser: 1.15 mg/dL — ABNORMAL HIGH (ref 0.44–1.00)
GFR, Estimated: 51 mL/min — ABNORMAL LOW (ref >60)
Glucose, Bld: 121 mg/dL — ABNORMAL HIGH (ref 70–99)
Potassium: 5.1 mmol/L (ref 3.5–5.1)
Sodium: 137 mmol/L (ref 135–145)

## 2020-02-11 LAB — CBC
HCT: 36.7 % (ref 36.0–46.0)
Hemoglobin: 10.8 g/dL — ABNORMAL LOW (ref 12.0–15.0)
MCH: 25 pg — ABNORMAL LOW (ref 26.0–34.0)
MCHC: 29.4 g/dL — ABNORMAL LOW (ref 30.0–36.0)
MCV: 85 fL (ref 80.0–100.0)
Platelets: 388 10*3/uL (ref 150–400)
RBC: 4.32 MIL/uL (ref 3.87–5.11)
RDW: 16 % — ABNORMAL HIGH (ref 11.5–15.5)
WBC: 8.1 10*3/uL (ref 4.0–10.5)
nRBC: 0 % (ref 0.0–0.2)

## 2020-02-11 MED ORDER — HYDROMORPHONE HCL 1 MG/ML IJ SOLN
1.0000 mg | Freq: Once | INTRAMUSCULAR | Status: AC
  Administered 2020-02-11: 1 mg via INTRAVENOUS
  Filled 2020-02-11: qty 1

## 2020-02-11 MED ORDER — LINEZOLID 600 MG PO TABS: 600.0000 mg | ORAL_TABLET | Freq: Two times a day (BID) | ORAL | 0 refills | Status: DC

## 2020-02-11 MED FILL — LINEZOLID 600 MG TAB: 600 | 7 days supply | Qty: 14 | Fill #0

## 2020-02-11 NOTE — Discharge Instructions (Signed)
MIDLINE WOUND CARE: ?- midline dressing to be changed twice daily ?- supplies: sterile saline, gauze, scissors, ABD pads, tape  ?- remove dressing and all packing carefully, moistening with sterile saline as needed to avoid packing/internal dressing sticking to the wound. ?- clean edges of skin around the wound with water/gauze, making sure there is no tape debris or leakage left on skin that could cause skin irritation or breakdown. ?- dampen and clean gauze with sterile saline and pack wound from wound base to skin level, making sure to take note of any possible areas of wound tracking, tunneling and packing appropriately. Wound can be packed loosely. Trim kerlix to size if a whole kerlix is not required. ?- cover wound with a dry ABD pad and secure with tape.  ?- write the date/time on the dry dressing/tape to better track when the last dressing change occurred. ?- apply any skin protectant/powder recommended by clinician to protect skin/skin folds. ?- change dressing as needed if leakage occurs, wound gets contaminated, or patient requests to shower. ?- patient may shower daily with wound open and following the shower the wound should be dried and a clean dressing placed.  ? ?

## 2020-02-11 NOTE — TOC Benefit Eligibility Note (Signed)
Transition of Care (TOC) Benefit Eligibility Note    Patient Details  Name: Kelly Curtis MRN: [id number] Date of Birth: 01/06/1946   Medication/Dose: Linezolid 600mg bid for 7 days  Covered?: Yes  Tier: Other  Prescription Coverage Preferred Pharmacy: local pharmacy  Spoke with Person/Company/Phone Number:: Marley[employer] [phone]  Co-Pay: $24.00  Prior Approval: No  Deductible: Met       ,  Phone Number: 02/11/2020, 10:29 AM     

## 2020-02-11 NOTE — Discharge Summary (Signed)
Physician Discharge Summary  Kelly Curtis YOV:[id number] DOB: June 02, 1945 DOA: 02/09/2020  PCP: [name], MD  Admit date: 02/09/2020 Discharge date: 02/11/2020  Admitted From: home Disposition:  home  Recommendations for Outpatient Follow-up:  1. Follow up with PCP in 1-2 weeks 2. Follow-up with surgery as an outpatient 3. Please follow-up final cultures from surgery  Home Health: RN Equipment/Devices: None  Discharge Condition: Stable CODE STATUS: DNR Diet recommendation: Regular  HPI: Per admitting MD, This is a 74 y.o. 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  Patient had a lap chole in September by Dr. [name] and states that about 2 weeks ago she had abdominal wall erythema near her RUQ operative site with pain.  She had a CT abdomen pelvis with contrast on 02/06/2020 which showed an abscess of the right oblique abdominal muscles extending to the skin surface but states that she was told she had postoperative fluid which should resolve on its own.  The erythema became worse and painful with an increase in swelling and she had a fever yesterday up to 85 F which she states is high for her (temperature typically runs low at 96 F).  Today she states she feels "like crap."  No nausea or vomiting, no diarrhea but has chronic constipation from her opiates.  Hospital Course / Discharge diagnoses:  Principal Problem Right upper quadrant abdominal wall abscess with surrounding cellulitis, with recent history of laparoscopic cholecystectomy September 2021 -Nontoxic-appearing, she is afebrile and without leukocytosis.  Due to antibiotic allergies surgery recommended linezolid, ciprofloxacin and metronidazole. General surgery consulted and patient was taken to the  operating room on 11/15 and is status post incision and debridement of abdominal wall abscess.  Following surgery she recovered well, she was narrowed to Linezolid and will be discharged home in stable condition with 7 additional days of antibiotics.  Cultures obtained intraoperatively did not show any bacteria on the Gram stain but final cultures are pending at the time of discharge.  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 -Has intrathecal pump, continue home regimen Anxiety/depression-Continue home medications Chronic hypotension-resume home regimen Essential hypertension -Continue Toprol, also on midodrine, defer to PCP further adjustments Chronic diastolic CHF -euvolemic  Discharge Instructions   Allergies as of 02/11/2020      Reactions   Demerol  [meperidine Hcl] Nausea And Vomiting   Gabapentin Other (See Comments)   Suicidal ideation (03/23/18 - pt currently is getting small amount in pain pump with no reaction)   Hydroxyzine Other (See Comments)   hallucinations   Midazolam Other (See Comments)   Bradycardia   Trazodone And Nefazodone Other (See Comments)   dizziness   Escitalopram Oxalate Other (See Comments)   confusion   Penicillins Rash   DID THE REACTION INVOLVE: Swelling of the face/tongue/throat, SOB, or low BP? No Sudden or severe rash/hives, skin peeling, or the inside of the mouth or nose? Yes Did it require medical treatment? Yes When did it last happen?74 years old If all above answers are "NO", may proceed with cephalosporin use.   Vancomycin Swelling, Rash      Medication List    TAKE these medications   acetaminophen 500 MG tablet Commonly known as: TYLENOL Take  1,000 mg by mouth every 6 (six) hours as needed for moderate pain.   Artificial Tears 1.4 % ophthalmic solution Generic drug: polyvinyl  alcohol Place 1 drop into both eyes 3 (three) times daily as needed for dry eyes.   aspirin EC 81 MG tablet Take 81 mg by mouth daily.   buPROPion 150 MG 24 hr tablet Commonly known as: WELLBUTRIN XL Take 150 mg by mouth daily.   dimenhyDRINATE 50 MG tablet Commonly known as: DRAMAMINE Take 50 mg by mouth every 8 (eight) hours as needed for nausea or dizziness.   DULoxetine HCl 40 MG Cpep Take 40 mg by mouth daily.   DULoxetine 30 MG capsule Commonly known as: CYMBALTA Take 30 mg by mouth 2 (two) times daily.   FLINTSTONES PLUS IRON PO Take 1 tablet by mouth daily.   HYDROcodone-acetaminophen 5-325 MG tablet Commonly known as: NORCO/VICODIN Take 1 tablet by mouth every 6 (six) hours as needed for moderate pain.   levETIRAcetam 250 MG tablet Commonly known as: KEPPRA Take 250 mg by mouth 2 (two) times daily.   linezolid 600 MG tablet Commonly known as: Zyvox Take 1 tablet (600 mg total) by mouth 2 (two) times daily.   methadone 10 MG tablet Commonly known as: DOLOPHINE Take 10 mg by mouth every 8 (eight) hours.   metoprolol succinate 25 MG 24 hr tablet Commonly known as: TOPROL-XL Take 12.5 mg by mouth daily.   mexiletine 150 MG capsule Commonly known as: MEXITIL Take 150 mg by mouth every other day.   midodrine 5 MG tablet Commonly known as: PROAMATINE Take 5 mg by mouth daily.   naproxen 500 MG tablet Commonly known as: NAPROSYN Take 500 mg by mouth 2 (two) times daily as needed for moderate pain.   Narcan 4 MG/0.1ML Liqd nasal spray kit Generic drug: naloxone Place 1 spray into the nose once as needed (opiod overdose).   omeprazole 20 MG capsule Commonly known as: PRILOSEC Take 20 mg by mouth daily.   PARoxetine 10 MG tablet Commonly known as: PAXIL Take 10 mg by mouth daily.   polyethylene glycol 17 g packet Commonly known as: MIRALAX / GLYCOLAX Take 17 g by mouth daily.   tiZANidine 4 MG tablet Commonly known as: ZANAFLEX Take 4 mg by  mouth 3 (three) times daily as needed for muscle spasms.   traMADol 50 MG tablet Commonly known as: Ultram Take 1 tablet (50 mg total) by mouth every 6 (six) hours as needed.   zolpidem 10 MG tablet Commonly known as: AMBIEN Take 10 mg by mouth at bedtime as needed for sleep.       Follow-up Information    [facility], [facility] [place] Follow up on 03/03/2020.   Specialty: General Surgery Why: 11:15am, arrive by 10:45am for paperwork and check in process Contact information: [address] [place] [address] [phone]               Consultations:  General surgery  Procedures/Studies: Incision and debridement of abdominal wall abscess, 3 x 5 cm including skin, subcutaneous tissue, fascia and muscle - Dr Kae Heller, 11/15  CT ABDOMEN PELVIS W CONTRAST  Result Date: 02/06/2020 CLINICAL DATA:  74 year old presenting with cellulitis involving the RIGHT LOWER abdomen. Surgical history includes cholecystectomy. EXAM: CT ABDOMEN AND PELVIS WITH CONTRAST TECHNIQUE: Multidetector CT imaging of the abdomen and pelvis was performed using the standard protocol following bolus administration of intravenous contrast. CONTRAST:  136m OMNIPAQUE IOHEXOL 300 MG/ML IV. COMPARISON:  12/20/2019.  FINDINGS: Lower chest: Respiratory motion blurred images of the lung bases. Visualized lung bases clear. Normal heart size. RIGHT coronary artery atherosclerosis. Mitral valve annuloplasty. Hepatobiliary: Liver normal in size and appearance. Surgically absent gallbladder. No unexpected biliary ductal dilation. Pancreas: Normal in appearance without evidence of mass, ductal dilation, or inflammation. Spleen: Approximate 8 mm low-attenuation lesion posteriorly in the LOWER pole, statistically a benign cyst or hemangioma. Otherwise normal appearance. Adrenals/Urinary Tract: Normal appearing adrenal glands. Kidneys normal in size and appearance without focal parenchymal abnormality. No hydronephrosis. No  evidence of urinary tract calculi. Stomach/Bowel: Stomach normal in appearance for the degree of distention. Normal-appearing small bowel. Descending and sigmoid colon diverticulosis without evidence of acute diverticulitis. Remainder of the colon unremarkable. Appendix not conspicuous, but no pericecal inflammation. Vascular/Lymphatic: Mild aorto-iliofemoral atherosclerosis without evidence of aneurysm. Normal-appearing portal venous and systemic venous systems. No pathologic lymphadenopathy. Reproductive: Uterus not conspicuous and presumed surgically absent; if no prior hysterectomy, then the uterus is markedly atrophic. No adnexal masses. Other: Fluid collection with an enhancing rim involving the RIGHT oblique abdominal muscles, with the fluid collection having multiple locules and extending to the skin surface, measuring in total approximately 4.7 x 4.0 x 7.0 cm. Battery generator pack for DORSAL spinal cord stimulators in the bilateral buttocks. Port for an intraspinal catheter in the LEFT ANTERIOR abdominal wall. Musculoskeletal: DORSAL cord stimulator device over the mid and lower thoracic spine. Intraspinal catheter. Degenerative disc disease at L5-S1. 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 No acute findings. IMPRESSION: 1. Abscess involving the RIGHT oblique abdominal muscles extending to the skin surface, measured above. The abscess has multiple locules. 2. Descending and sigmoid colon diverticulosis without evidence of acute diverticulitis. 3. 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 Aortic Atherosclerosis (ICD10-I70.0). Electronically Signed   By: Evangeline Dakin M.D.   On: 02/06/2020 15:42      Subjective: - no chest pain, shortness of breath, no abdominal pain, nausea or vomiting.   Discharge Exam: BP 125/64   Pulse 77   Temp 97.7 F (36.5 C)   Resp 18   Ht '5\' 4"'  (1.626 m)   Wt 67.1 kg   SpO2 98%   BMI 25.40 kg/m   General: Pt is alert, awake, not in acute  distress Cardiovascular: RRR, S1/S2 +, no rubs, no gallops Respiratory: CTA bilaterally, no wheezing, no rhonchi Abdominal: Soft, NT, ND, bowel sounds + Extremities: no edema, no cyanosis    The results of significant diagnostics from this hospitalization (including imaging, microbiology, ancillary and laboratory) are listed below for reference.     Microbiology: Recent Results (from the past 240 hour(s))  Respiratory Panel by RT PCR (Flu A&B, Covid) - Nasopharyngeal Swab     Status: None   Collection Time: 02/09/20  2:46 PM   Specimen: Nasopharyngeal Swab  Result Value Ref Range Status   SARS Coronavirus 2 by RT PCR NEGATIVE NEGATIVE Final    Comment: (NOTE) SARS-CoV-2 target nucleic acids are NOT DETECTED.  The SARS-CoV-2 RNA is generally detectable in upper respiratoy specimens during the acute phase of infection. The lowest concentration of SARS-CoV-2 viral copies this assay can detect is 131 copies/mL. A negative result does not preclude SARS-Cov-2 infection and should not be used as the sole basis for treatment or other patient management decisions. A negative result may occur with  improper specimen collection/handling, submission of specimen other than nasopharyngeal swab, presence of viral mutation(s) within the areas targeted by this assay, and inadequate number of viral copies (<131 copies/mL). A negative result  must be combined with clinical observations, patient history, and epidemiological information. The expected result is Negative.  Fact Sheet for Patients:  PinkCheek.be  Fact Sheet for Healthcare Providers:  GravelBags.it  This test is no t yet approved or cleared by the [place] FDA and  has been authorized for detection and/or diagnosis of SARS-CoV-2 by FDA under an Emergency Use Authorization (EUA). This EUA will remain  in effect (meaning this test can be used) for the duration of  the COVID-19 declaration under Section 564(b)(1) of the Act, 21 U.S.C. section 360bbb-3(b)(1), unless the authorization is terminated or revoked sooner.     Influenza A by PCR NEGATIVE NEGATIVE Final   Influenza B by PCR NEGATIVE NEGATIVE Final    Comment: (NOTE) The Xpert Xpress SARS-CoV-2/FLU/RSV assay is intended as an aid in  the diagnosis of influenza from Nasopharyngeal swab specimens and  should not be used as a sole basis for treatment. Nasal washings and  aspirates are unacceptable for Xpert Xpress SARS-CoV-2/FLU/RSV  testing.  Fact Sheet for Patients: PinkCheek.be  Fact Sheet for Healthcare Providers: GravelBags.it  This test is not yet approved or cleared by the [place] FDA and  has been authorized for detection and/or diagnosis of SARS-CoV-2 by  FDA under an Emergency Use Authorization (EUA). This EUA will remain  in effect (meaning this test can be used) for the duration of the  Covid-19 declaration under Section 564(b)(1) of the Act, 21  U.S.C. section 360bbb-3(b)(1), unless the authorization is  terminated or revoked. Performed at [facility], [place] [address], [place], [place] [address]   Surgical pcr screen     Status: None   Collection Time: 02/09/20 10:05 PM   Specimen: Nasal Mucosa; Nasal Swab  Result Value Ref Range Status   MRSA, PCR NEGATIVE NEGATIVE Final   Staphylococcus aureus NEGATIVE NEGATIVE Final    Comment: (NOTE) The Xpert SA Assay (FDA approved for NASAL specimens in patients 55 years of age and older), is one component of a comprehensive surveillance program. It is not intended to diagnose infection nor to guide or monitor treatment. Performed at [facility], [place] [address] [place], [place] [address]   Aerobic/Anaerobic Culture (surgical/deep wound)     Status: None (Preliminary result)   Collection Time: 02/10/20 10:14 AM   Specimen:  PATH Other; Tissue  Result Value Ref Range Status   Specimen Description   Final    ABSCESS ABDOMINAL WALL Performed at [facility] [address] [place], [place] [address]    Special Requests   Final    NONE Performed at [facility], [place] [address] [place], [place] [address]    Gram Stain   Final    FEW WBC PRESENT,BOTH PMN AND MONONUCLEAR NO ORGANISMS SEEN Performed at [facility], [place] [address] [place],  [address]    Culture PENDING  Incomplete   Report Status PENDING  Incomplete     Labs: Basic Metabolic Panel: Recent Labs  Lab 02/06/20 1444 02/09/20 1254 02/11/20 0354  NA  --  134* 137  K  --  3.7 5.1  CL  --  98 100  CO2  --  30 28  GLUCOSE  --  105* 121*  BUN  --  25* 20  CREATININE 1.00 1.35* 1.15*  CALCIUM  --  9.2 9.4   Liver Function Tests: Recent Labs  Lab 02/09/20 1254  AST 15  ALT 13  ALKPHOS 137*  BILITOT 0.5  PROT 7.6  ALBUMIN 3.8   CBC: Recent Labs  Lab 02/09/20 1254 02/11/20 0354  WBC 7.6 8.1  NEUTROABS 5.8  --   HGB 11.2* 10.8*  HCT 38.9 36.7  MCV 84.2 85.0  PLT 402* 388   CBG: No results for input(s): GLUCAP in the last 168 hours. Hgb A1c No results for input(s): HGBA1C in the last 72 hours. Lipid Profile No results for input(s): CHOL, HDL, LDLCALC, TRIG, CHOLHDL, LDLDIRECT in the last 72 hours. Thyroid function studies No results for input(s): TSH, T4TOTAL, T3FREE, THYROIDAB in the last 72 hours.  Invalid input(s): FREET3 Urinalysis    Component Value Date/Time   COLORURINE YELLOW 12/20/2019 [place] 12/20/2019 1840   LABSPEC 1.010 12/20/2019 1840   PHURINE 6.5 12/20/2019 1840   GLUCOSEU NEGATIVE 12/20/2019 1840   HGBUR NEGATIVE 12/20/2019 1840   BILIRUBINUR NEGATIVE 12/20/2019 1840   KETONESUR NEGATIVE 12/20/2019 1840   PROTEINUR NEGATIVE 12/20/2019 1840   NITRITE NEGATIVE 12/20/2019 1840   LEUKOCYTESUR NEGATIVE 12/20/2019 1840     FURTHER DISCHARGE INSTRUCTIONS:   Get Medicines reviewed and adjusted: Please take all your medications with you for your next visit with your Primary MD   Laboratory/radiological data: Please request your Primary MD to go over all hospital tests and procedure/radiological results at the follow up, please ask your Primary MD to get all Hospital records sent to his/her office.   In some cases, they will be blood work, cultures and biopsy results pending at the time of your discharge. Please request that your primary care M.D. goes through all the records of your hospital data and follows up on these results.   Also Note the following: If you experience worsening of your admission symptoms, develop shortness of breath, life threatening emergency, suicidal or homicidal thoughts you must seek medical attention immediately by calling 911 or calling your MD immediately  if symptoms less severe.   You must read complete instructions/literature along with all the possible adverse reactions/side effects for all the Medicines you take and that have been prescribed to you. Take any new Medicines after you have completely understood and accpet all the possible adverse reactions/side effects.    Do not drive when taking Pain medications or sleeping medications (Benzodaizepines)   Do not take more than prescribed Pain, Sleep and Anxiety Medications. It is not advisable to combine anxiety,sleep and pain medications without talking with your primary care practitioner   Special Instructions: If you have smoked or chewed Tobacco  in the last 2 yrs please stop smoking, stop any regular Alcohol  and or any Recreational drug use.   Wear Seat belts while driving.   Please note: You were cared for by a hospitalist during your hospital stay. Once you are discharged, your primary care physician will handle any further medical issues. Please note that NO REFILLS for any discharge medications will be authorized  once you are discharged, as it is imperative that you return to your primary care physician (or establish a relationship with a primary care physician if you do not have one) for your post hospital discharge needs so that they can reassess your need for medications and monitor your lab values.  Time coordinating discharge: 35 minutes  SIGNED:  Marzetta Board, MD, PhD 02/11/2020, 10:41 AM

## 2020-02-11 NOTE — Progress Notes (Signed)
Patient's discharge instructions, follow up made.  No narcotics will be prescribed as she just got a 30 day supply filled by her physician Dr. Maryruth Eve.  Henreitta Cea 9:17 AM 02/11/2020

## 2020-02-11 NOTE — Progress Notes (Signed)
1 Day Post-Op   Subjective/Chief Complaint: Feeling somewhat better than yesterday, appetite is better, pain radiating to lower chest is improved   Objective: Vital signs in last 24 hours: Temp:  [97.5 F (36.4 C)-98.6 F (37 C)] 97.7 F (36.5 C) (11/15 1945) Pulse Rate:  [59-77] 77 (11/15 1945) Resp:  [10-19] 18 (11/15 1945) BP: (112-155)/(62-89) 125/64 (11/15 1945) SpO2:  [93 %-100 %] 98 % (11/15 1945)    Intake/Output from previous day: 11/15 0701 - 11/16 0700 In: 1117.6 [P.O.:120; I.V.:500; IV Piggyback:497.6] Out: -  Intake/Output this shift: No intake/output data recorded.  Alert and cooperative, unlabored respirations, in better spirits today than yesterday.  Cellulitis around abdominal wall wound is resolved.  Serosanguineous drainage on dressing.  Lab Results:  Recent Labs    02/09/20 1254 02/11/20 0354  WBC 7.6 8.1  HGB 11.2* 10.8*  HCT 38.9 36.7  PLT 402* 388   BMET Recent Labs    02/09/20 1254 02/11/20 0354  NA 134* 137  K 3.7 5.1  CL 98 100  CO2 30 28  GLUCOSE 105* 121*  BUN 25* 20  CREATININE 1.35* 1.15*  CALCIUM 9.2 9.4   PT/INR No results for input(s): LABPROT, INR in the last 72 hours. ABG No results for input(s): PHART, HCO3 in the last 72 hours.  Invalid input(s): PCO2, PO2  Studies/Results: No results found.  Anti-infectives: Anti-infectives (From admission, onward)   Start     Dose/Rate Route Frequency Ordered Stop   02/10/20 1000  linezolid (ZYVOX) IVPB 600 mg        600 mg 300 mL/hr over 60 Minutes Intravenous Every 12 hours 02/09/20 1745     02/10/20 0600  ciprofloxacin (CIPRO) IVPB 400 mg        400 mg 200 mL/hr over 60 Minutes Intravenous Every 12 hours 02/09/20 1748     02/09/20 2200  metroNIDAZOLE (FLAGYL) tablet 500 mg        500 mg Oral Every 8 hours 02/09/20 1742     02/09/20 1600  linezolid (ZYVOX) IVPB 600 mg        600 mg 300 mL/hr over 60 Minutes Intravenous  Once 02/09/20 1528 02/09/20 2254   02/09/20 1600   ciprofloxacin (CIPRO) IVPB 400 mg        400 mg 200 mL/hr over 60 Minutes Intravenous  Once 02/09/20 1535 02/09/20 1627   02/09/20 1545  metroNIDAZOLE (FLAGYL) tablet 500 mg        500 mg Oral  Once 02/09/20 1530 02/09/20 1613      Assessment/Plan: s/p Procedure(s): IRRIGATION AND DEBRIDEMENT ABSCESS (N/A)  Packing change today to ensure patient tolerates Plan discharge today     LOS: 2 days    Clovis Riley 02/11/2020

## 2020-02-11 NOTE — TOC Transition Note (Signed)
Transition of Care [facility]) - CM/SW Discharge Note   Patient Details  Name: Kelly Curtis MRN: [id number] Date of Birth: Mar 24, 1946  [facility] of [facility] CM/SW Contact:  Trish Mage, LCSW Phone Number: 02/11/2020, 11:50 AM   Clinical Narrative:   Patient who is set for d/c today is in need of [facility] RN.  [name] states she will be going to stay with her sister at [facility] in [place].She and her sister will be jointly responsible for her dressing changes and she is open to having an RN visit, specified no agency preference.  I contacted [place] with [facility] who confirms they will provide [facility] RN.  Also got benefits check for Linezolid, which was found to be affordable for patient and is being filled and will be delivered by pharmacy. Pharmacy needs a payment for delivery, and patient asked me to call sister, who confirmed she would call pharmacy to make the payment. TOC will continue to follow during the course of hospitalization.     Final next level of care: [place] Barriers to Discharge: No Barriers Identified   Patient Goals and CMS Choice        Discharge Placement                       Discharge Plan and Services                                     Social Determinants of Health (SDOH) Interventions     Readmission Risk Interventions No flowsheet data found.

## 2020-02-15 DIAGNOSIS — I9589 Other hypotension: Secondary | ICD-10-CM | POA: Diagnosis not present

## 2020-02-15 DIAGNOSIS — Z9181 History of falling: Secondary | ICD-10-CM | POA: Diagnosis not present

## 2020-02-15 DIAGNOSIS — F419 Anxiety disorder, unspecified: Secondary | ICD-10-CM | POA: Diagnosis not present

## 2020-02-15 DIAGNOSIS — Z79891 Long term (current) use of opiate analgesic: Secondary | ICD-10-CM | POA: Diagnosis not present

## 2020-02-15 DIAGNOSIS — L02211 Cutaneous abscess of abdominal wall: Secondary | ICD-10-CM | POA: Diagnosis not present

## 2020-02-15 DIAGNOSIS — Z791 Long term (current) use of non-steroidal anti-inflammatories (NSAID): Secondary | ICD-10-CM | POA: Diagnosis not present

## 2020-02-15 DIAGNOSIS — Z4801 Encounter for change or removal of surgical wound dressing: Secondary | ICD-10-CM | POA: Diagnosis not present

## 2020-02-15 DIAGNOSIS — F32A Depression, unspecified: Secondary | ICD-10-CM | POA: Diagnosis not present

## 2020-02-15 DIAGNOSIS — I7381 Erythromelalgia: Secondary | ICD-10-CM | POA: Diagnosis not present

## 2020-02-15 DIAGNOSIS — I5032 Chronic diastolic (congestive) heart failure: Secondary | ICD-10-CM | POA: Diagnosis not present

## 2020-02-15 DIAGNOSIS — Z87891 Personal history of nicotine dependence: Secondary | ICD-10-CM | POA: Diagnosis not present

## 2020-02-15 DIAGNOSIS — N1831 Chronic kidney disease, stage 3a: Secondary | ICD-10-CM | POA: Diagnosis not present

## 2020-02-15 DIAGNOSIS — L03311 Cellulitis of abdominal wall: Secondary | ICD-10-CM | POA: Diagnosis not present

## 2020-02-15 DIAGNOSIS — I130 Hypertensive heart and chronic kidney disease with heart failure and stage 1 through stage 4 chronic kidney disease, or unspecified chronic kidney disease: Secondary | ICD-10-CM | POA: Diagnosis not present

## 2020-02-15 LAB — AEROBIC/ANAEROBIC CULTURE W GRAM STAIN (SURGICAL/DEEP WOUND): Culture: NO GROWTH

## 2020-02-18 DIAGNOSIS — D508 Other iron deficiency anemias: Secondary | ICD-10-CM | POA: Diagnosis not present

## 2020-02-18 DIAGNOSIS — F325 Major depressive disorder, single episode, in full remission: Secondary | ICD-10-CM | POA: Diagnosis not present

## 2020-02-18 DIAGNOSIS — N1831 Chronic kidney disease, stage 3a: Secondary | ICD-10-CM | POA: Diagnosis not present

## 2020-02-18 DIAGNOSIS — I129 Hypertensive chronic kidney disease with stage 1 through stage 4 chronic kidney disease, or unspecified chronic kidney disease: Secondary | ICD-10-CM | POA: Diagnosis not present

## 2020-02-19 DIAGNOSIS — I129 Hypertensive chronic kidney disease with stage 1 through stage 4 chronic kidney disease, or unspecified chronic kidney disease: Secondary | ICD-10-CM | POA: Diagnosis not present

## 2020-02-19 DIAGNOSIS — Z79899 Other long term (current) drug therapy: Secondary | ICD-10-CM | POA: Diagnosis not present

## 2020-02-19 DIAGNOSIS — N1831 Chronic kidney disease, stage 3a: Secondary | ICD-10-CM | POA: Diagnosis not present

## 2020-02-19 DIAGNOSIS — L02211 Cutaneous abscess of abdominal wall: Secondary | ICD-10-CM | POA: Diagnosis not present

## 2020-02-19 DIAGNOSIS — R197 Diarrhea, unspecified: Secondary | ICD-10-CM | POA: Diagnosis not present

## 2020-02-21 DIAGNOSIS — L02211 Cutaneous abscess of abdominal wall: Secondary | ICD-10-CM | POA: Diagnosis not present

## 2020-02-21 DIAGNOSIS — I130 Hypertensive heart and chronic kidney disease with heart failure and stage 1 through stage 4 chronic kidney disease, or unspecified chronic kidney disease: Secondary | ICD-10-CM | POA: Diagnosis not present

## 2020-02-21 DIAGNOSIS — L03311 Cellulitis of abdominal wall: Secondary | ICD-10-CM | POA: Diagnosis not present

## 2020-02-21 DIAGNOSIS — I5032 Chronic diastolic (congestive) heart failure: Secondary | ICD-10-CM | POA: Diagnosis not present

## 2020-02-21 DIAGNOSIS — I7381 Erythromelalgia: Secondary | ICD-10-CM | POA: Diagnosis not present

## 2020-02-21 DIAGNOSIS — N1831 Chronic kidney disease, stage 3a: Secondary | ICD-10-CM | POA: Diagnosis not present

## 2020-02-23 DIAGNOSIS — L02211 Cutaneous abscess of abdominal wall: Secondary | ICD-10-CM | POA: Diagnosis not present

## 2020-02-23 DIAGNOSIS — I7381 Erythromelalgia: Secondary | ICD-10-CM | POA: Diagnosis not present

## 2020-02-23 DIAGNOSIS — I5032 Chronic diastolic (congestive) heart failure: Secondary | ICD-10-CM | POA: Diagnosis not present

## 2020-02-23 DIAGNOSIS — L03311 Cellulitis of abdominal wall: Secondary | ICD-10-CM | POA: Diagnosis not present

## 2020-02-23 DIAGNOSIS — N1831 Chronic kidney disease, stage 3a: Secondary | ICD-10-CM | POA: Diagnosis not present

## 2020-02-23 DIAGNOSIS — I130 Hypertensive heart and chronic kidney disease with heart failure and stage 1 through stage 4 chronic kidney disease, or unspecified chronic kidney disease: Secondary | ICD-10-CM | POA: Diagnosis not present

## 2020-02-24 DIAGNOSIS — L02211 Cutaneous abscess of abdominal wall: Secondary | ICD-10-CM | POA: Diagnosis not present

## 2020-02-24 DIAGNOSIS — L03311 Cellulitis of abdominal wall: Secondary | ICD-10-CM | POA: Diagnosis not present

## 2020-02-24 DIAGNOSIS — I130 Hypertensive heart and chronic kidney disease with heart failure and stage 1 through stage 4 chronic kidney disease, or unspecified chronic kidney disease: Secondary | ICD-10-CM | POA: Diagnosis not present

## 2020-02-24 DIAGNOSIS — I7381 Erythromelalgia: Secondary | ICD-10-CM | POA: Diagnosis not present

## 2020-02-24 DIAGNOSIS — I5032 Chronic diastolic (congestive) heart failure: Secondary | ICD-10-CM | POA: Diagnosis not present

## 2020-02-24 DIAGNOSIS — N1831 Chronic kidney disease, stage 3a: Secondary | ICD-10-CM | POA: Diagnosis not present

## 2020-02-25 DIAGNOSIS — Z978 Presence of other specified devices: Secondary | ICD-10-CM | POA: Diagnosis not present

## 2020-02-25 DIAGNOSIS — I7381 Erythromelalgia: Secondary | ICD-10-CM | POA: Diagnosis not present

## 2020-02-25 DIAGNOSIS — M792 Neuralgia and neuritis, unspecified: Secondary | ICD-10-CM | POA: Diagnosis not present

## 2020-02-25 DIAGNOSIS — G894 Chronic pain syndrome: Secondary | ICD-10-CM | POA: Diagnosis not present

## 2020-02-28 DIAGNOSIS — R0789 Other chest pain: Secondary | ICD-10-CM | POA: Diagnosis not present

## 2020-02-28 DIAGNOSIS — Z79899 Other long term (current) drug therapy: Secondary | ICD-10-CM | POA: Diagnosis not present

## 2020-02-28 DIAGNOSIS — L02211 Cutaneous abscess of abdominal wall: Secondary | ICD-10-CM | POA: Diagnosis not present

## 2020-02-28 DIAGNOSIS — I950 Idiopathic hypotension: Secondary | ICD-10-CM | POA: Diagnosis not present

## 2020-03-04 ENCOUNTER — Encounter: Payer: Self-pay | Admitting: Internal Medicine

## 2020-03-04 DIAGNOSIS — L03311 Cellulitis of abdominal wall: Secondary | ICD-10-CM | POA: Diagnosis not present

## 2020-03-04 DIAGNOSIS — N1831 Chronic kidney disease, stage 3a: Secondary | ICD-10-CM | POA: Diagnosis not present

## 2020-03-04 DIAGNOSIS — I5032 Chronic diastolic (congestive) heart failure: Secondary | ICD-10-CM | POA: Diagnosis not present

## 2020-03-04 DIAGNOSIS — I130 Hypertensive heart and chronic kidney disease with heart failure and stage 1 through stage 4 chronic kidney disease, or unspecified chronic kidney disease: Secondary | ICD-10-CM | POA: Diagnosis not present

## 2020-03-04 DIAGNOSIS — L02211 Cutaneous abscess of abdominal wall: Secondary | ICD-10-CM | POA: Diagnosis not present

## 2020-03-04 DIAGNOSIS — I7381 Erythromelalgia: Secondary | ICD-10-CM | POA: Diagnosis not present

## 2020-03-04 NOTE — Telephone Encounter (Signed)
This encounter was created in error - please disregard.

## 2020-03-12 DIAGNOSIS — I7381 Erythromelalgia: Secondary | ICD-10-CM | POA: Diagnosis not present

## 2020-03-12 DIAGNOSIS — N1831 Chronic kidney disease, stage 3a: Secondary | ICD-10-CM | POA: Diagnosis not present

## 2020-03-12 DIAGNOSIS — L02211 Cutaneous abscess of abdominal wall: Secondary | ICD-10-CM | POA: Diagnosis not present

## 2020-03-12 DIAGNOSIS — I130 Hypertensive heart and chronic kidney disease with heart failure and stage 1 through stage 4 chronic kidney disease, or unspecified chronic kidney disease: Secondary | ICD-10-CM | POA: Diagnosis not present

## 2020-03-12 DIAGNOSIS — I5032 Chronic diastolic (congestive) heart failure: Secondary | ICD-10-CM | POA: Diagnosis not present

## 2020-03-12 DIAGNOSIS — L03311 Cellulitis of abdominal wall: Secondary | ICD-10-CM | POA: Diagnosis not present

## 2020-03-13 DIAGNOSIS — I951 Orthostatic hypotension: Secondary | ICD-10-CM | POA: Diagnosis not present

## 2020-03-13 DIAGNOSIS — I5189 Other ill-defined heart diseases: Secondary | ICD-10-CM | POA: Diagnosis not present

## 2020-03-13 DIAGNOSIS — N1831 Chronic kidney disease, stage 3a: Secondary | ICD-10-CM | POA: Diagnosis not present

## 2020-03-13 DIAGNOSIS — F325 Major depressive disorder, single episode, in full remission: Secondary | ICD-10-CM | POA: Diagnosis not present

## 2020-03-13 DIAGNOSIS — I129 Hypertensive chronic kidney disease with stage 1 through stage 4 chronic kidney disease, or unspecified chronic kidney disease: Secondary | ICD-10-CM | POA: Diagnosis not present

## 2020-03-13 DIAGNOSIS — Z0000 Encounter for general adult medical examination without abnormal findings: Secondary | ICD-10-CM | POA: Diagnosis not present

## 2020-03-13 DIAGNOSIS — Z23 Encounter for immunization: Secondary | ICD-10-CM | POA: Diagnosis not present

## 2020-03-13 DIAGNOSIS — Z1389 Encounter for screening for other disorder: Secondary | ICD-10-CM | POA: Diagnosis not present

## 2020-03-16 DIAGNOSIS — I129 Hypertensive chronic kidney disease with stage 1 through stage 4 chronic kidney disease, or unspecified chronic kidney disease: Secondary | ICD-10-CM | POA: Diagnosis not present

## 2020-03-16 DIAGNOSIS — L03311 Cellulitis of abdominal wall: Secondary | ICD-10-CM | POA: Diagnosis not present

## 2020-03-16 DIAGNOSIS — I5032 Chronic diastolic (congestive) heart failure: Secondary | ICD-10-CM | POA: Diagnosis not present

## 2020-03-16 DIAGNOSIS — F325 Major depressive disorder, single episode, in full remission: Secondary | ICD-10-CM | POA: Diagnosis not present

## 2020-03-16 DIAGNOSIS — D508 Other iron deficiency anemias: Secondary | ICD-10-CM | POA: Diagnosis not present

## 2020-03-16 DIAGNOSIS — Z79891 Long term (current) use of opiate analgesic: Secondary | ICD-10-CM | POA: Diagnosis not present

## 2020-03-16 DIAGNOSIS — I130 Hypertensive heart and chronic kidney disease with heart failure and stage 1 through stage 4 chronic kidney disease, or unspecified chronic kidney disease: Secondary | ICD-10-CM | POA: Diagnosis not present

## 2020-03-16 DIAGNOSIS — Z9181 History of falling: Secondary | ICD-10-CM | POA: Diagnosis not present

## 2020-03-16 DIAGNOSIS — N1831 Chronic kidney disease, stage 3a: Secondary | ICD-10-CM | POA: Diagnosis not present

## 2020-03-16 DIAGNOSIS — F32A Depression, unspecified: Secondary | ICD-10-CM | POA: Diagnosis not present

## 2020-03-16 DIAGNOSIS — I9589 Other hypotension: Secondary | ICD-10-CM | POA: Diagnosis not present

## 2020-03-16 DIAGNOSIS — Z87891 Personal history of nicotine dependence: Secondary | ICD-10-CM | POA: Diagnosis not present

## 2020-03-16 DIAGNOSIS — L02211 Cutaneous abscess of abdominal wall: Secondary | ICD-10-CM | POA: Diagnosis not present

## 2020-03-16 DIAGNOSIS — Z791 Long term (current) use of non-steroidal anti-inflammatories (NSAID): Secondary | ICD-10-CM | POA: Diagnosis not present

## 2020-03-16 DIAGNOSIS — Z4801 Encounter for change or removal of surgical wound dressing: Secondary | ICD-10-CM | POA: Diagnosis not present

## 2020-03-16 DIAGNOSIS — F419 Anxiety disorder, unspecified: Secondary | ICD-10-CM | POA: Diagnosis not present

## 2020-03-16 DIAGNOSIS — I7381 Erythromelalgia: Secondary | ICD-10-CM | POA: Diagnosis not present

## 2020-03-18 DIAGNOSIS — I5032 Chronic diastolic (congestive) heart failure: Secondary | ICD-10-CM | POA: Diagnosis not present

## 2020-03-18 DIAGNOSIS — N1831 Chronic kidney disease, stage 3a: Secondary | ICD-10-CM | POA: Diagnosis not present

## 2020-03-18 DIAGNOSIS — L03311 Cellulitis of abdominal wall: Secondary | ICD-10-CM | POA: Diagnosis not present

## 2020-03-18 DIAGNOSIS — I7381 Erythromelalgia: Secondary | ICD-10-CM | POA: Diagnosis not present

## 2020-03-18 DIAGNOSIS — I130 Hypertensive heart and chronic kidney disease with heart failure and stage 1 through stage 4 chronic kidney disease, or unspecified chronic kidney disease: Secondary | ICD-10-CM | POA: Diagnosis not present

## 2020-03-18 DIAGNOSIS — L02211 Cutaneous abscess of abdominal wall: Secondary | ICD-10-CM | POA: Diagnosis not present

## 2020-03-25 DIAGNOSIS — I130 Hypertensive heart and chronic kidney disease with heart failure and stage 1 through stage 4 chronic kidney disease, or unspecified chronic kidney disease: Secondary | ICD-10-CM | POA: Diagnosis not present

## 2020-03-25 DIAGNOSIS — I5032 Chronic diastolic (congestive) heart failure: Secondary | ICD-10-CM | POA: Diagnosis not present

## 2020-03-25 DIAGNOSIS — N1831 Chronic kidney disease, stage 3a: Secondary | ICD-10-CM | POA: Diagnosis not present

## 2020-03-25 DIAGNOSIS — L03311 Cellulitis of abdominal wall: Secondary | ICD-10-CM | POA: Diagnosis not present

## 2020-03-25 DIAGNOSIS — I7381 Erythromelalgia: Secondary | ICD-10-CM | POA: Diagnosis not present

## 2020-03-25 DIAGNOSIS — L02211 Cutaneous abscess of abdominal wall: Secondary | ICD-10-CM | POA: Diagnosis not present

## 2020-03-26 DIAGNOSIS — Z9682 Presence of neurostimulator: Secondary | ICD-10-CM | POA: Diagnosis not present

## 2020-03-26 DIAGNOSIS — M50322 Other cervical disc degeneration at C5-C6 level: Secondary | ICD-10-CM | POA: Diagnosis not present

## 2020-04-01 DIAGNOSIS — L02211 Cutaneous abscess of abdominal wall: Secondary | ICD-10-CM | POA: Diagnosis not present

## 2020-04-01 DIAGNOSIS — N1831 Chronic kidney disease, stage 3a: Secondary | ICD-10-CM | POA: Diagnosis not present

## 2020-04-01 DIAGNOSIS — I130 Hypertensive heart and chronic kidney disease with heart failure and stage 1 through stage 4 chronic kidney disease, or unspecified chronic kidney disease: Secondary | ICD-10-CM | POA: Diagnosis not present

## 2020-04-01 DIAGNOSIS — I7381 Erythromelalgia: Secondary | ICD-10-CM | POA: Diagnosis not present

## 2020-04-01 DIAGNOSIS — I5032 Chronic diastolic (congestive) heart failure: Secondary | ICD-10-CM | POA: Diagnosis not present

## 2020-04-01 DIAGNOSIS — L03311 Cellulitis of abdominal wall: Secondary | ICD-10-CM | POA: Diagnosis not present

## 2020-04-08 DIAGNOSIS — I5032 Chronic diastolic (congestive) heart failure: Secondary | ICD-10-CM | POA: Diagnosis not present

## 2020-04-08 DIAGNOSIS — I7381 Erythromelalgia: Secondary | ICD-10-CM | POA: Diagnosis not present

## 2020-04-08 DIAGNOSIS — L03311 Cellulitis of abdominal wall: Secondary | ICD-10-CM | POA: Diagnosis not present

## 2020-04-08 DIAGNOSIS — I130 Hypertensive heart and chronic kidney disease with heart failure and stage 1 through stage 4 chronic kidney disease, or unspecified chronic kidney disease: Secondary | ICD-10-CM | POA: Diagnosis not present

## 2020-04-08 DIAGNOSIS — L02211 Cutaneous abscess of abdominal wall: Secondary | ICD-10-CM | POA: Diagnosis not present

## 2020-04-08 DIAGNOSIS — N1831 Chronic kidney disease, stage 3a: Secondary | ICD-10-CM | POA: Diagnosis not present

## 2020-04-22 DIAGNOSIS — B372 Candidiasis of skin and nail: Secondary | ICD-10-CM | POA: Diagnosis not present

## 2020-04-22 DIAGNOSIS — F325 Major depressive disorder, single episode, in full remission: Secondary | ICD-10-CM | POA: Diagnosis not present

## 2020-04-22 DIAGNOSIS — R413 Other amnesia: Secondary | ICD-10-CM | POA: Diagnosis not present

## 2020-04-22 DIAGNOSIS — I129 Hypertensive chronic kidney disease with stage 1 through stage 4 chronic kidney disease, or unspecified chronic kidney disease: Secondary | ICD-10-CM | POA: Diagnosis not present

## 2020-04-22 DIAGNOSIS — I700 Atherosclerosis of aorta: Secondary | ICD-10-CM | POA: Diagnosis not present

## 2020-04-22 DIAGNOSIS — N1831 Chronic kidney disease, stage 3a: Secondary | ICD-10-CM | POA: Diagnosis not present

## 2020-04-22 DIAGNOSIS — R42 Dizziness and giddiness: Secondary | ICD-10-CM | POA: Diagnosis not present

## 2020-04-22 DIAGNOSIS — R232 Flushing: Secondary | ICD-10-CM | POA: Diagnosis not present

## 2020-04-22 DIAGNOSIS — I7381 Erythromelalgia: Secondary | ICD-10-CM | POA: Diagnosis not present

## 2020-04-22 DIAGNOSIS — I5189 Other ill-defined heart diseases: Secondary | ICD-10-CM | POA: Diagnosis not present

## 2020-04-23 DIAGNOSIS — D508 Other iron deficiency anemias: Secondary | ICD-10-CM | POA: Diagnosis not present

## 2020-04-23 DIAGNOSIS — N1831 Chronic kidney disease, stage 3a: Secondary | ICD-10-CM | POA: Diagnosis not present

## 2020-04-23 DIAGNOSIS — I129 Hypertensive chronic kidney disease with stage 1 through stage 4 chronic kidney disease, or unspecified chronic kidney disease: Secondary | ICD-10-CM | POA: Diagnosis not present

## 2020-04-23 DIAGNOSIS — F325 Major depressive disorder, single episode, in full remission: Secondary | ICD-10-CM | POA: Diagnosis not present

## 2020-05-08 DIAGNOSIS — L989 Disorder of the skin and subcutaneous tissue, unspecified: Secondary | ICD-10-CM | POA: Diagnosis not present

## 2020-05-08 DIAGNOSIS — G4700 Insomnia, unspecified: Secondary | ICD-10-CM | POA: Diagnosis not present

## 2020-05-13 DIAGNOSIS — G4700 Insomnia, unspecified: Secondary | ICD-10-CM | POA: Diagnosis not present

## 2020-05-13 DIAGNOSIS — R519 Headache, unspecified: Secondary | ICD-10-CM | POA: Diagnosis not present

## 2020-05-13 DIAGNOSIS — G8929 Other chronic pain: Secondary | ICD-10-CM | POA: Diagnosis not present

## 2020-05-13 DIAGNOSIS — M792 Neuralgia and neuritis, unspecified: Secondary | ICD-10-CM | POA: Diagnosis not present

## 2020-05-13 DIAGNOSIS — M5442 Lumbago with sciatica, left side: Secondary | ICD-10-CM | POA: Diagnosis not present

## 2020-05-13 DIAGNOSIS — N1831 Chronic kidney disease, stage 3a: Secondary | ICD-10-CM | POA: Diagnosis not present

## 2020-05-13 DIAGNOSIS — I129 Hypertensive chronic kidney disease with stage 1 through stage 4 chronic kidney disease, or unspecified chronic kidney disease: Secondary | ICD-10-CM | POA: Diagnosis not present

## 2020-05-13 DIAGNOSIS — D508 Other iron deficiency anemias: Secondary | ICD-10-CM | POA: Diagnosis not present

## 2020-05-13 DIAGNOSIS — I7381 Erythromelalgia: Secondary | ICD-10-CM | POA: Diagnosis not present

## 2020-05-13 DIAGNOSIS — F325 Major depressive disorder, single episode, in full remission: Secondary | ICD-10-CM | POA: Diagnosis not present

## 2020-06-04 DIAGNOSIS — H16143 Punctate keratitis, bilateral: Secondary | ICD-10-CM | POA: Diagnosis not present

## 2020-06-11 DIAGNOSIS — I129 Hypertensive chronic kidney disease with stage 1 through stage 4 chronic kidney disease, or unspecified chronic kidney disease: Secondary | ICD-10-CM | POA: Diagnosis not present

## 2020-06-11 DIAGNOSIS — G4700 Insomnia, unspecified: Secondary | ICD-10-CM | POA: Diagnosis not present

## 2020-06-11 DIAGNOSIS — F325 Major depressive disorder, single episode, in full remission: Secondary | ICD-10-CM | POA: Diagnosis not present

## 2020-06-11 DIAGNOSIS — D508 Other iron deficiency anemias: Secondary | ICD-10-CM | POA: Diagnosis not present

## 2020-06-11 DIAGNOSIS — N1831 Chronic kidney disease, stage 3a: Secondary | ICD-10-CM | POA: Diagnosis not present

## 2020-06-12 DIAGNOSIS — H16223 Keratoconjunctivitis sicca, not specified as Sjogren's, bilateral: Secondary | ICD-10-CM | POA: Diagnosis not present

## 2020-06-12 DIAGNOSIS — R300 Dysuria: Secondary | ICD-10-CM | POA: Diagnosis not present

## 2020-06-12 DIAGNOSIS — H04123 Dry eye syndrome of bilateral lacrimal glands: Secondary | ICD-10-CM | POA: Diagnosis not present

## 2020-06-12 DIAGNOSIS — M7121 Synovial cyst of popliteal space [Baker], right knee: Secondary | ICD-10-CM | POA: Diagnosis not present

## 2020-06-15 ENCOUNTER — Other Ambulatory Visit: Payer: Self-pay

## 2020-06-15 ENCOUNTER — Ambulatory Visit
Admission: RE | Admit: 2020-06-15 | Discharge: 2020-06-15 | Disposition: A | Discharge status: Home / Self Care | Payer: Medicare Other | Source: Ambulatory Visit | Attending: Geriatric Medicine | Admitting: Geriatric Medicine

## 2020-06-15 ENCOUNTER — Other Ambulatory Visit: Payer: Self-pay | Admitting: Geriatric Medicine

## 2020-06-15 ENCOUNTER — Encounter (HOSPITAL_COMMUNITY): Payer: Self-pay

## 2020-06-15 ENCOUNTER — Inpatient Hospital Stay (HOSPITAL_COMMUNITY)
Admission: EM | Admit: 2020-06-15 | Discharge: 2020-06-25 | DRG: 438 | Disposition: A | Discharge status: Home Health | Payer: Medicare Other | Attending: Internal Medicine | Admitting: Internal Medicine

## 2020-06-15 DIAGNOSIS — R1030 Lower abdominal pain, unspecified: Secondary | ICD-10-CM | POA: Diagnosis not present

## 2020-06-15 DIAGNOSIS — F32A Depression, unspecified: Secondary | ICD-10-CM | POA: Diagnosis present

## 2020-06-15 DIAGNOSIS — K859 Acute pancreatitis without necrosis or infection, unspecified: Secondary | ICD-10-CM | POA: Diagnosis not present

## 2020-06-15 DIAGNOSIS — J9601 Acute respiratory failure with hypoxia: Secondary | ICD-10-CM | POA: Diagnosis not present

## 2020-06-15 DIAGNOSIS — J90 Pleural effusion, not elsewhere classified: Secondary | ICD-10-CM | POA: Diagnosis not present

## 2020-06-15 DIAGNOSIS — D649 Anemia, unspecified: Secondary | ICD-10-CM | POA: Diagnosis not present

## 2020-06-15 DIAGNOSIS — N179 Acute kidney failure, unspecified: Secondary | ICD-10-CM | POA: Diagnosis present

## 2020-06-15 DIAGNOSIS — Z880 Allergy status to penicillin: Secondary | ICD-10-CM

## 2020-06-15 DIAGNOSIS — E876 Hypokalemia: Secondary | ICD-10-CM | POA: Diagnosis not present

## 2020-06-15 DIAGNOSIS — Z20822 Contact with and (suspected) exposure to covid-19: Secondary | ICD-10-CM | POA: Diagnosis present

## 2020-06-15 DIAGNOSIS — J189 Pneumonia, unspecified organism: Secondary | ICD-10-CM | POA: Diagnosis not present

## 2020-06-15 DIAGNOSIS — I5022 Chronic systolic (congestive) heart failure: Secondary | ICD-10-CM | POA: Diagnosis not present

## 2020-06-15 DIAGNOSIS — Z9049 Acquired absence of other specified parts of digestive tract: Secondary | ICD-10-CM | POA: Diagnosis not present

## 2020-06-15 DIAGNOSIS — G894 Chronic pain syndrome: Secondary | ICD-10-CM | POA: Diagnosis present

## 2020-06-15 DIAGNOSIS — K219 Gastro-esophageal reflux disease without esophagitis: Secondary | ICD-10-CM | POA: Diagnosis not present

## 2020-06-15 DIAGNOSIS — G9341 Metabolic encephalopathy: Secondary | ICD-10-CM | POA: Diagnosis not present

## 2020-06-15 DIAGNOSIS — N390 Urinary tract infection, site not specified: Secondary | ICD-10-CM | POA: Diagnosis not present

## 2020-06-15 DIAGNOSIS — Z96611 Presence of right artificial shoulder joint: Secondary | ICD-10-CM | POA: Diagnosis present

## 2020-06-15 DIAGNOSIS — R0602 Shortness of breath: Secondary | ICD-10-CM | POA: Diagnosis not present

## 2020-06-15 DIAGNOSIS — N182 Chronic kidney disease, stage 2 (mild): Secondary | ICD-10-CM | POA: Diagnosis present

## 2020-06-15 DIAGNOSIS — J9811 Atelectasis: Secondary | ICD-10-CM | POA: Diagnosis not present

## 2020-06-15 DIAGNOSIS — R109 Unspecified abdominal pain: Secondary | ICD-10-CM

## 2020-06-15 DIAGNOSIS — Z841 Family history of disorders of kidney and ureter: Secondary | ICD-10-CM

## 2020-06-15 DIAGNOSIS — Z7401 Bed confinement status: Secondary | ICD-10-CM | POA: Diagnosis not present

## 2020-06-15 DIAGNOSIS — Z888 Allergy status to other drugs, medicaments and biological substances status: Secondary | ICD-10-CM

## 2020-06-15 DIAGNOSIS — E860 Dehydration: Secondary | ICD-10-CM | POA: Diagnosis present

## 2020-06-15 DIAGNOSIS — Z66 Do not resuscitate: Secondary | ICD-10-CM | POA: Diagnosis present

## 2020-06-15 DIAGNOSIS — G8929 Other chronic pain: Secondary | ICD-10-CM | POA: Diagnosis present

## 2020-06-15 DIAGNOSIS — Z885 Allergy status to narcotic agent status: Secondary | ICD-10-CM

## 2020-06-15 DIAGNOSIS — Z808 Family history of malignant neoplasm of other organs or systems: Secondary | ICD-10-CM

## 2020-06-15 DIAGNOSIS — Z86718 Personal history of other venous thrombosis and embolism: Secondary | ICD-10-CM

## 2020-06-15 DIAGNOSIS — I5032 Chronic diastolic (congestive) heart failure: Secondary | ICD-10-CM | POA: Diagnosis not present

## 2020-06-15 DIAGNOSIS — Z85828 Personal history of other malignant neoplasm of skin: Secondary | ICD-10-CM

## 2020-06-15 DIAGNOSIS — Z781 Physical restraint status: Secondary | ICD-10-CM

## 2020-06-15 DIAGNOSIS — Z820 Family history of epilepsy and other diseases of the nervous system: Secondary | ICD-10-CM

## 2020-06-15 DIAGNOSIS — R0600 Dyspnea, unspecified: Secondary | ICD-10-CM

## 2020-06-15 DIAGNOSIS — Z87891 Personal history of nicotine dependence: Secondary | ICD-10-CM | POA: Diagnosis not present

## 2020-06-15 DIAGNOSIS — K5730 Diverticulosis of large intestine without perforation or abscess without bleeding: Secondary | ICD-10-CM | POA: Diagnosis not present

## 2020-06-15 DIAGNOSIS — F419 Anxiety disorder, unspecified: Secondary | ICD-10-CM | POA: Diagnosis present

## 2020-06-15 DIAGNOSIS — Z79890 Hormone replacement therapy (postmenopausal): Secondary | ICD-10-CM

## 2020-06-15 DIAGNOSIS — Z91040 Latex allergy status: Secondary | ICD-10-CM

## 2020-06-15 DIAGNOSIS — Z8249 Family history of ischemic heart disease and other diseases of the circulatory system: Secondary | ICD-10-CM

## 2020-06-15 DIAGNOSIS — I959 Hypotension, unspecified: Secondary | ICD-10-CM | POA: Diagnosis not present

## 2020-06-15 DIAGNOSIS — D509 Iron deficiency anemia, unspecified: Secondary | ICD-10-CM | POA: Diagnosis present

## 2020-06-15 DIAGNOSIS — R4182 Altered mental status, unspecified: Secondary | ICD-10-CM | POA: Diagnosis not present

## 2020-06-15 DIAGNOSIS — Z79899 Other long term (current) drug therapy: Secondary | ICD-10-CM

## 2020-06-15 DIAGNOSIS — J811 Chronic pulmonary edema: Secondary | ICD-10-CM | POA: Diagnosis not present

## 2020-06-15 DIAGNOSIS — F1190 Opioid use, unspecified, uncomplicated: Secondary | ICD-10-CM | POA: Diagnosis present

## 2020-06-15 DIAGNOSIS — Z7982 Long term (current) use of aspirin: Secondary | ICD-10-CM

## 2020-06-15 DIAGNOSIS — G4700 Insomnia, unspecified: Secondary | ICD-10-CM | POA: Diagnosis not present

## 2020-06-15 DIAGNOSIS — I7381 Erythromelalgia: Secondary | ICD-10-CM | POA: Diagnosis not present

## 2020-06-15 DIAGNOSIS — R000 Tachycardia, unspecified: Secondary | ICD-10-CM | POA: Diagnosis not present

## 2020-06-15 LAB — COMPREHENSIVE METABOLIC PANEL
ALT: 24 U/L (ref 0–44)
AST: 37 U/L (ref 15–41)
Albumin: 3.1 g/dL — ABNORMAL LOW (ref 3.5–5.0)
Alkaline Phosphatase: 174 U/L — ABNORMAL HIGH (ref 38–126)
Anion gap: 12 (ref 5–15)
BUN: 49 mg/dL — ABNORMAL HIGH (ref 8–23)
CO2: 27 mmol/L (ref 22–32)
Calcium: 9 mg/dL (ref 8.9–10.3)
Chloride: 102 mmol/L (ref 98–111)
Creatinine, Ser: 2.12 mg/dL — ABNORMAL HIGH (ref 0.44–1.00)
GFR, Estimated: 24 mL/min — ABNORMAL LOW (ref >60)
Glucose, Bld: 91 mg/dL (ref 70–99)
Potassium: 4 mmol/L (ref 3.5–5.1)
Sodium: 141 mmol/L (ref 135–145)
Total Bilirubin: 0.9 mg/dL (ref 0.3–1.2)
Total Protein: 6.1 g/dL — ABNORMAL LOW (ref 6.5–8.1)

## 2020-06-15 LAB — CBC WITH DIFFERENTIAL/PLATELET
Abs Immature Granulocytes: 0.01 10*3/uL (ref 0.00–0.07)
Basophils Absolute: 0 10*3/uL (ref 0.0–0.1)
Basophils Relative: 0 %
Eosinophils Absolute: 0.4 10*3/uL (ref 0.0–0.5)
Eosinophils Relative: 6 %
HCT: 30 % — ABNORMAL LOW (ref 36.0–46.0)
Hemoglobin: 8.8 g/dL — ABNORMAL LOW (ref 12.0–15.0)
Immature Granulocytes: 0 %
Lymphocytes Relative: 9 %
Lymphs Abs: 0.6 10*3/uL — ABNORMAL LOW (ref 0.7–4.0)
MCH: 25.7 pg — ABNORMAL LOW (ref 26.0–34.0)
MCHC: 29.3 g/dL — ABNORMAL LOW (ref 30.0–36.0)
MCV: 87.5 fL (ref 80.0–100.0)
Monocytes Absolute: 0.4 10*3/uL (ref 0.1–1.0)
Monocytes Relative: 6 %
Neutro Abs: 5.4 10*3/uL (ref 1.7–7.7)
Neutrophils Relative %: 79 %
Platelets: 207 10*3/uL (ref 150–400)
RBC: 3.43 MIL/uL — ABNORMAL LOW (ref 3.87–5.11)
RDW: 19.9 % — ABNORMAL HIGH (ref 11.5–15.5)
WBC: 6.9 10*3/uL (ref 4.0–10.5)
nRBC: 0.4 % — ABNORMAL HIGH (ref 0.0–0.2)

## 2020-06-15 LAB — URINALYSIS, ROUTINE W REFLEX MICROSCOPIC
Bacteria, UA: NONE SEEN
Bilirubin Urine: NEGATIVE
Glucose, UA: NEGATIVE mg/dL
Ketones, ur: NEGATIVE mg/dL
Nitrite: NEGATIVE
Protein, ur: 30 mg/dL — AB
RBC / HPF: >50 RBC/hpf — ABNORMAL HIGH (ref 0–5)
Specific Gravity, Urine: 1.018 (ref 1.005–1.030)
WBC, UA: >50 WBC/hpf — ABNORMAL HIGH (ref 0–5)
pH: 5 (ref 5.0–8.0)

## 2020-06-15 LAB — LIPASE, BLOOD: Lipase: 3205 U/L — ABNORMAL HIGH (ref 11–51)

## 2020-06-15 MED ORDER — DIPHENHYDRAMINE HCL 25 MG PO CAPS
25.0000 mg | ORAL_CAPSULE | Freq: Four times a day (QID) | ORAL | Status: DC | PRN
  Administered 2020-06-16: 25 mg via ORAL
  Filled 2020-06-15: qty 1

## 2020-06-15 MED ORDER — NALOXONE HCL 0.4 MG/ML IJ SOLN: 0.4000 mg | INTRAMUSCULAR | Status: DC | PRN

## 2020-06-15 MED ORDER — SODIUM CHLORIDE 0.9 % IV BOLUS
1000.0000 mL | Freq: Once | INTRAVENOUS | Status: AC
  Administered 2020-06-15: 1000 mL via INTRAVENOUS

## 2020-06-15 MED ORDER — METHADONE HCL 10 MG PO TABS
10.0000 mg | ORAL_TABLET | Freq: Three times a day (TID) | ORAL | Status: DC
  Administered 2020-06-16 – 2020-06-19 (×9): 10 mg via ORAL
  Filled 2020-06-15 (×10): qty 1

## 2020-06-15 MED ORDER — ACETAMINOPHEN 325 MG PO TABS: 650.0000 mg | ORAL_TABLET | Freq: Four times a day (QID) | ORAL | Status: DC | PRN

## 2020-06-15 MED ORDER — MIDODRINE HCL 5 MG PO TABS
10.0000 mg | ORAL_TABLET | Freq: Three times a day (TID) | ORAL | Status: DC
  Administered 2020-06-16: 10 mg via ORAL
  Filled 2020-06-15 (×4): qty 2

## 2020-06-15 MED ORDER — LOTEPREDNOL ETABONATE 0.5 % OP SUSP
1.0000 [drp] | Freq: Two times a day (BID) | OPHTHALMIC | Status: DC
  Administered 2020-06-16 – 2020-06-25 (×16): 1 [drp] via OPHTHALMIC
  Filled 2020-06-15: qty 5

## 2020-06-15 MED ORDER — MELATONIN 5 MG PO TABS
5.0000 mg | ORAL_TABLET | Freq: Every day | ORAL | Status: DC
  Administered 2020-06-16 – 2020-06-24 (×9): 5 mg via ORAL
  Filled 2020-06-15 (×9): qty 1

## 2020-06-15 MED ORDER — TIZANIDINE HCL 4 MG PO TABS
4.0000 mg | ORAL_TABLET | Freq: Three times a day (TID) | ORAL | Status: DC | PRN
  Administered 2020-06-16: 4 mg via ORAL
  Filled 2020-06-15: qty 1

## 2020-06-15 MED ORDER — ONDANSETRON HCL 4 MG PO TABS: 4.0000 mg | ORAL_TABLET | Freq: Four times a day (QID) | ORAL | Status: DC | PRN

## 2020-06-15 MED ORDER — PANTOPRAZOLE SODIUM 40 MG PO TBEC
40.0000 mg | DELAYED_RELEASE_TABLET | Freq: Every day | ORAL | Status: DC
  Administered 2020-06-16: 40 mg via ORAL
  Filled 2020-06-15: qty 1

## 2020-06-15 MED ORDER — ONDANSETRON HCL 4 MG/2ML IJ SOLN
4.0000 mg | Freq: Four times a day (QID) | INTRAMUSCULAR | Status: DC | PRN
  Administered 2020-06-16 – 2020-06-18 (×2): 4 mg via INTRAVENOUS
  Filled 2020-06-15 (×2): qty 2

## 2020-06-15 MED ORDER — ONDANSETRON HCL 4 MG/2ML IJ SOLN
4.0000 mg | Freq: Once | INTRAMUSCULAR | Status: AC
  Administered 2020-06-15: 4 mg via INTRAVENOUS
  Filled 2020-06-15: qty 2

## 2020-06-15 MED ORDER — MORPHINE SULFATE (PF) 4 MG/ML IV SOLN
4.0000 mg | Freq: Once | INTRAVENOUS | Status: AC
  Administered 2020-06-15: 4 mg via INTRAVENOUS
  Filled 2020-06-15: qty 1

## 2020-06-15 MED ORDER — PAROXETINE HCL 10 MG PO TABS
10.0000 mg | ORAL_TABLET | Freq: Every day | ORAL | Status: DC
  Administered 2020-06-16 – 2020-06-25 (×8): 10 mg via ORAL
  Filled 2020-06-15 (×10): qty 1

## 2020-06-15 MED ORDER — LACTATED RINGERS IV SOLN: INTRAVENOUS | Status: DC

## 2020-06-15 MED ORDER — LEVETIRACETAM 250 MG PO TABS
250.0000 mg | ORAL_TABLET | Freq: Two times a day (BID) | ORAL | Status: DC
  Administered 2020-06-16 (×2): 250 mg via ORAL
  Filled 2020-06-15 (×3): qty 1

## 2020-06-15 MED ORDER — ENOXAPARIN SODIUM 30 MG/0.3ML ~~LOC~~ SOLN
30.0000 mg | SUBCUTANEOUS | Status: DC
  Administered 2020-06-16 – 2020-06-17 (×2): 30 mg via SUBCUTANEOUS
  Filled 2020-06-15 (×2): qty 0.3

## 2020-06-15 MED ORDER — IOPAMIDOL (ISOVUE-300) INJECTION 61%
100.0000 mL | Freq: Once | INTRAVENOUS | Status: AC | PRN
  Administered 2020-06-15: 100 mL via INTRAVENOUS

## 2020-06-15 MED ORDER — BUPROPION HCL ER (XL) 150 MG PO TB24
150.0000 mg | ORAL_TABLET | Freq: Every day | ORAL | Status: DC
  Administered 2020-06-16 – 2020-06-25 (×9): 150 mg via ORAL
  Filled 2020-06-15 (×9): qty 1

## 2020-06-15 MED ORDER — BUSPIRONE HCL 5 MG PO TABS
5.0000 mg | ORAL_TABLET | Freq: Two times a day (BID) | ORAL | Status: DC
  Administered 2020-06-16 – 2020-06-25 (×18): 5 mg via ORAL
  Filled 2020-06-15 (×18): qty 1

## 2020-06-15 MED ORDER — ACETAMINOPHEN 650 MG RE SUPP: 650.0000 mg | Freq: Four times a day (QID) | RECTAL | Status: DC | PRN

## 2020-06-15 MED ORDER — CYCLOSPORINE 0.05 % OP EMUL
1.0000 [drp] | Freq: Two times a day (BID) | OPHTHALMIC | Status: DC
  Administered 2020-06-16 – 2020-06-25 (×17): 1 [drp] via OPHTHALMIC
  Filled 2020-06-15 (×20): qty 1

## 2020-06-15 MED ORDER — SUVOREXANT 10 MG PO TABS: 10.0000 mg | ORAL_TABLET | Freq: Every day | ORAL | Status: DC

## 2020-06-15 MED ORDER — MIRTAZAPINE 15 MG PO TABS
7.5000 mg | ORAL_TABLET | Freq: Every day | ORAL | Status: DC
  Administered 2020-06-16 – 2020-06-24 (×9): 7.5 mg via ORAL
  Filled 2020-06-15 (×9): qty 1

## 2020-06-15 MED ORDER — CIPROFLOXACIN HCL 250 MG PO TABS
250.0000 mg | ORAL_TABLET | Freq: Two times a day (BID) | ORAL | Status: DC
  Administered 2020-06-16 (×2): 250 mg via ORAL
  Filled 2020-06-15 (×2): qty 1

## 2020-06-15 MED ORDER — POLYVINYL ALCOHOL 1.4 % OP SOLN: 1.0000 [drp] | Freq: Three times a day (TID) | OPHTHALMIC | Status: DC | PRN

## 2020-06-15 MED ORDER — POLYETHYLENE GLYCOL 3350 17 G PO PACK
17.0000 g | PACK | Freq: Every day | ORAL | Status: DC | PRN
Start: 2020-06-15 — End: 2020-06-25

## 2020-06-15 MED ORDER — HYDROMORPHONE HCL 1 MG/ML IJ SOLN
1.0000 mg | Freq: Once | INTRAMUSCULAR | Status: AC
  Administered 2020-06-15: 1 mg via INTRAVENOUS
  Filled 2020-06-15: qty 1

## 2020-06-15 MED ORDER — HYDROMORPHONE HCL 1 MG/ML IJ SOLN
1.0000 mg | INTRAMUSCULAR | Status: DC | PRN
  Administered 2020-06-16 – 2020-06-18 (×12): 1 mg via INTRAVENOUS
  Filled 2020-06-15 (×12): qty 1

## 2020-06-15 NOTE — H&P (Signed)
History and Physical  Patient Name: Kelly Curtis     ZDG:[id number]    DOB: December 31, 1945    DOA: 06/15/2020 PCP: [name], MD  Patient coming from: Home  Chief Complaint: Intractable abdominal pain    HPI: Kelly Curtis is a 75 y.o. 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   Patient states over the past few days she has had intractable upper abdominal pain that has been significant.  She has had poor p.o. intake.  Urine output has diminished.  She has had some diarrhea.  She has a history of chronic pain however never this bad.  Some nausea but no vomiting.  She recently saw her PCP and was prescribed ciprofloxacin for UTI.  She also had CT ordered and was performed earlier today that demonstrated findings concerning for pancreatitis thus she presented to the ER for further evaluation management.  She has continued on her chronic pain medication and still having significant abdominal pain.  She has a history of complicated gallbladder surgery with previous abscess formation.  She does not drink alcohol.  Besides starting antibiotic for UTI, only other medication change was starting oral Dilaudid.    ED course: -Vitals on admission: Afebrile, heart rate 101, blood pressure 165/100, maintaining sats on room air -Labs on initial presentation: WBC 6.9, hemoglobin 8.8, sodium 141, potassium 4, chloride 102, bicarb 27, glucose 91, BUN 49, creatinine 2.1, lipase 3200 -Imaging obtained on admission: CT abdomen pelvis demonstrated acute pancreatitis -In the ED the patient was given pain medication, 2 L of NS, Zofran, and the hospitalist service was contacted for further evaluation and management.     ROS: A complete and thorough 12 point review of systems obtained, negative listed in HPI.     Past Medical History:  Diagnosis Date  . Arthritis   . Cancer ([place])    skin cancer  x 3  . Chronic kidney disease   . Constipation due to pain medication   . Depression with anxiety   . DVT (deep venous thrombosis) [facility])    age 28ish  . Dysrhythmia    PVCs; perioperative PAF with MV repair ~ 2000   . 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 GERD (gastroesophageal reflux disease)   . Headache    migraine heads- 4 times a year  . History of blood transfusion   . Hypotension   . 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 AUGMENTATION MAMMAPLASTY Bilateral   . CHOLECYSTECTOMY N/A 12/12/2019   Procedure: LAPAROSCOPIC CHOLECYSTECTOMY;  Surgeon: [name], MD;  Location: [place];  Service: General;  Laterality: N/A;  . COLONOSCOPY    . ENDOSCOPIC RETROGRADE CHOLANGIOPANCREATOGRAPHY (ERCP) WITH PROPOFOL N/A 09/17/2019   Procedure: ENDOSCOPIC RETROGRADE CHOLANGIOPANCREATOGRAPHY (ERCP) WITH PROPOFOL;  Surgeon: Clarene Essex, MD;  Location: [facility];  Service: Endoscopy;  Laterality: N/A;  . ESOPHAGOGASTRODUODENOSCOPY (EGD) WITH PROPOFOL N/A 09/06/2019   Procedure: ESOPHAGOGASTRODUODENOSCOPY (EGD) WITH PROPOFOL;  Surgeon: Ronnette Juniper, MD;  Location: [facility];  Service: Gastroenterology;  Laterality: N/A;  . EYE SURGERY Bilateral    cataract  . IRRIGATION AND DEBRIDEMENT ABSCESS N/A 02/10/2020   Procedure: IRRIGATION AND DEBRIDEMENT ABSCESS;  Surgeon: Clovis Riley, MD;  Location: [facility];  Service: General;  Laterality: N/A;  . MITRAL VALVE REPAIR    .  REMOVAL OF STONES  09/17/2019   Procedure: REMOVAL OF STONES;  Surgeon: Clarene Essex, MD;  Location: [facility];  Service: Endoscopy;;  . [name]  09/17/2019   Procedure: [name];  Surgeon: Clarene Essex, MD;  Location: [facility];  Service: Endoscopy;;  . TOTAL SHOULDER ARTHROPLASTY Right 2013    Social History: Patient lives with sister.  The patient primarily bedbound.  Non smoker or  drinker.  Allergies  Allergen Reactions  . Pregabalin     Other reaction(s): Other, Suicidal ideations Suicidal ideations   . Demerol  [Meperidine Hcl] Nausea And Vomiting  . Ferrous Sulfate Nausea And Vomiting    Other reaction(s): upset stomach  . Gabapentin Other (See Comments)    Suicidal ideation (03/23/18 - pt currently is getting small amount in pain pump with no reaction)  . Hydroxyzine Other (See Comments)    hallucinations  . Macrolides And Ketolides Swelling    Other reaction(s): Significant swelling  . Midazolam Other (See Comments)    Bradycardia  . Trazodone And Nefazodone Other (See Comments)    dizziness  . Ambien [Zolpidem] Other (See Comments)    unknown  . Latex     Other reaction(s): rash  . Escitalopram Oxalate Other (See Comments)    confusion  . Penicillins Rash    DID THE REACTION INVOLVE: Swelling of the face/tongue/throat, SOB, or low BP? No Sudden or severe rash/hives, skin peeling, or the inside of the mouth or nose? Yes Did it require medical treatment? Yes When did it last happen?75 years old If all above answers are "NO", may proceed with cephalosporin use.  . Vancomycin Swelling and Rash    Family history: family history includes Alzheimer's disease in her mother; Heart disease in her brother and mother; Kidney disease in her father; Melanoma in her sister.  Prior to Admission medications   Medication Sig Start Date End Date Taking? Authorizing Provider  acetaminophen (TYLENOL) 500 MG tablet Take 1,000 mg by mouth every 6 (six) hours as needed for moderate pain.   Yes [provider]  aspirin EC 81 MG tablet Take 81 mg by mouth daily as needed.   Yes [provider]  buPROPion (WELLBUTRIN XL) 150 MG 24 hr tablet Take 150 mg by mouth daily.    Yes [provider]  busPIRone (BUSPAR) 5 MG tablet Take 5 mg by mouth 2 (two) times daily. 06/04/20  Yes [provider]  calcium carbonate (TUMS - DOSED IN MG  ELEMENTAL CALCIUM) 500 MG chewable tablet Chew 500 mg by mouth daily as needed for indigestion or heartburn.   Yes [provider]  cetirizine (ZYRTEC) 10 MG tablet Take 10 mg by mouth daily.   Yes [provider]  ciprofloxacin (CIPRO) 250 MG tablet Take 250 mg by mouth 2 (two) times daily. Star date : 06/12/20   Yes [provider]  cycloSPORINE (RESTASIS) 0.05 % ophthalmic emulsion Place 1 drop into both eyes 2 (two) times daily.   Yes [provider]  dimenhyDRINATE (DRAMAMINE) 50 MG tablet Take 50 mg by mouth every 8 (eight) hours as needed for nausea or dizziness.   Yes [provider]  diphenhydrAMINE (BENADRYL) 25 MG tablet Take 25 mg by mouth every 6 (six) hours as needed for itching or sleep.   Yes [provider]  fluticasone (FLONASE) 50 MCG/ACT nasal spray Place 1 spray into both nostrils daily as needed for allergies or rhinitis.   Yes [provider]  furosemide (LASIX) 20 MG tablet  Take 20 mg by mouth 2 (two) times daily as needed for fluid.   Yes [provider]  glycerin SUPP Place 1 Chip rectally daily as needed for moderate constipation.   Yes [provider]  HYDROmorphone (DILAUDID) 2 MG tablet Take 2 mg by mouth every 4 (four) hours as needed for severe pain.   Yes [provider]  levETIRAcetam (KEPPRA) 250 MG tablet Take 250 mg by mouth 2 (two) times daily.  12/17/19 06/15/20 Yes [provider]  Lidocaine-Glycerin (PREPARATION H EX) Apply 1 application topically daily.   Yes [provider]  Loteprednol Etabonate 0.5 % EMUL Place 1 drop into both eyes 2 (two) times daily.   Yes [provider]  melatonin 5 MG TABS Take 5 mg by mouth at bedtime.   Yes [provider]  methadone (DOLOPHINE) 10 MG tablet Take 10 mg by mouth every 8 (eight) hours.    Yes [provider]  midodrine (PROAMATINE) 10 MG tablet Take 10 mg by mouth 3 (three) times daily.    Yes [provider]  mirtazapine (REMERON) 15 MG tablet Take 7.5 mg by mouth at bedtime.   Yes [provider]  naloxone (NARCAN) nasal spray 4 mg/0.1 mL Place 1 spray into the nose once as needed (opiod overdose).   Yes [provider]  naproxen (NAPROSYN) 500 MG tablet Take 500 mg by mouth 2 (two) times daily.   Yes [provider]  omeprazole (PRILOSEC) 20 MG capsule Take 20 mg by mouth daily.   Yes [provider]  PARoxetine (PAXIL) 10 MG tablet Take 10 mg by mouth daily.    Yes [provider]  Pediatric Multivitamins-Iron (FLINTSTONES PLUS IRON PO) Take 1 tablet by mouth daily.   Yes [provider]  polyethylene glycol (MIRALAX / GLYCOLAX) 17 g packet Take 17 g by mouth daily.   Yes [provider]  polyvinyl alcohol (LIQUIFILM TEARS) 1.4 % ophthalmic solution Place 1 drop into both eyes 3 (three) times daily as needed for dry eyes.    Yes [provider]  Suvorexant (BELSOMRA) 10 MG TABS Take 10 mg by mouth daily.   Yes [provider]  tiZANidine (ZANAFLEX) 4 MG tablet Take 4 mg by mouth 3 (three) times daily as needed for muscle spasms.    Yes [provider]  traMADol (ULTRAM) 50 MG tablet Take 1 tablet (50 mg total) by mouth every 6 (six) hours as needed. Patient not taking: No sig reported 12/12/19 12/11/20  [name], MD       Physical Exam: BP 104/60   Pulse (!) 111   Temp (!) 97.3 F (36.3 C) (Oral)   Resp (!) 23   SpO2 100%   General appearance:  Sick appearing, adult female, alert and in  moderate distress secondary to pain Eyes: Anicteric, conjunctiva pink, lids and lashes normal. PERRL.    Neck: No neck masses.  Trachea midline.  No thyromegaly/tenderness. Lymph: No cervical or supraclavicular lymphadenopathy. Skin: Warm and dry.  No jaundice.  No suspicious rashes or lesions. Cardiac:  Tachycardic, nl S1-S2, no murmurs appreciated.  No LE edema.  Radial and  pedal pulses 2+ and symmetric. Respiratory: Normal respiratory rate and rhythm.  CTAB without rales or wheezes. Abdomen:  Abdomen soft but extremely tender with minimal palpitation MSK:  Erythema lower extremities Neuro: Cranial nerves 2 through 12 grossly intact.  Sensation intact to light touch. Speech is fluent.  Marland Kitchen    Psych: Sensorium  intact and responding to questions, attention normal.  Behavior appropriate.  Judgment and insight appear normal.    Labs on Admission:  I have personally reviewed following labs and imaging studies: CBC: Recent Labs  Lab 06/15/20 2041  WBC 6.9  NEUTROABS 5.4  HGB 8.8*  HCT 30.0*  MCV 87.5  PLT 409   Basic Metabolic Panel: Recent Labs  Lab 06/15/20 2041  NA 141  K 4.0  CL 102  CO2 27  GLUCOSE 91  BUN 49*  CREATININE 2.12*  CALCIUM 9.0   GFR: CrCl cannot be calculated (Unknown ideal weight.).  Liver Function Tests: Recent Labs  Lab 06/15/20 2041  AST 37  ALT 24  ALKPHOS 174*  BILITOT 0.9  PROT 6.1*  ALBUMIN 3.1*   Recent Labs  Lab 06/15/20 2041  LIPASE 3,205*   No results for input(s): AMMONIA in the last 168 hours. Coagulation Profile: No results for input(s): INR, PROTIME in the last 168 hours. Cardiac Enzymes: No results for input(s): CKTOTAL, CKMB, CKMBINDEX, TROPONINI in the last 168 hours. BNP (last 3 results) No results for input(s): PROBNP in the last 8760 hours. HbA1C: No results for input(s): HGBA1C in the last 72 hours. CBG: No results for input(s): GLUCAP in the last 168 hours. Lipid Profile: No results for input(s): CHOL, HDL, LDLCALC, TRIG, CHOLHDL, LDLDIRECT in the last 72 hours. Thyroid Function Tests: No results for input(s): TSH, T4TOTAL, FREET4, T3FREE, THYROIDAB in the last 72 hours. Anemia Panel: No results for input(s): VITAMINB12, FOLATE, FERRITIN, TIBC, IRON, RETICCTPCT in the last 72 hours.   No results found for this or any previous visit (from the past 240 hour(s)).          Radiological Exams on Admission: Personally reviewed imaging which shows: Findings consistent with acute pancreatitis CT ABDOMEN PELVIS WO CONTRAST  Result Date: 06/15/2020 CLINICAL DATA:  Lower abdominal pain for the past 2 days. EXAM: CT ABDOMEN AND PELVIS WITHOUT CONTRAST TECHNIQUE: Multidetector CT imaging of the abdomen and pelvis was performed following the standard protocol without IV contrast. Creatinine was obtained on site at [facility] at [address] Results: Creatinine 1.9 mg/dL. COMPARISON:  CT abdomen pelvis dated February 06, 2020. FINDINGS: Lower chest: No acute abnormality. Atelectasis at the left lung base, predominantly within the lingula. Hepatobiliary: No focal liver abnormality is seen. Status post cholecystectomy. No biliary dilatation. Pancreas: Peripancreatic inflammatory changes predominantly involving the pancreatic head. No ductal dilatation. Spleen: Normal in size without focal abnormality. Adrenals/Urinary Tract: Adrenal glands are unremarkable. Mild bilateral renal atrophy. No renal calculi or hydronephrosis. The bladder is unremarkable for the degree of distention. Stomach/Bowel: Stomach is within normal limits. Diminutive or absent appendix. Reactive wall thickening of the second portion of the duodenum. No obstruction. Left-sided colonic diverticulosis. Vascular/Lymphatic: Aortic atherosclerosis. No enlarged abdominal or pelvic lymph nodes. Reproductive: Status post hysterectomy. No adnexal masses. Other: New trace free fluid in the pelvis.  No pneumoperitoneum. Musculoskeletal: No acute or significant osseous findings. 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 IMPRESSION: 1. Acute pancreatitis. 2. Aortic Atherosclerosis (ICD10-I70.0). Electronically Signed   By: Titus Dubin M.D.   On: 06/15/2020 15:10         Assessment/Plan   1.  Pancreatitis -Patient presents with extreme abdominal pain.  CT scanning  consistent with pancreatitis.  Lipase 3200 on admission -Specific etiology unknown at this time.  She has had a cholecystectomy and does not drink alcohol -Continue aggressive IV fluids.  But will need to  monitor closely given history of HFpEF, but apparently that was before she had mitral valve replaced -Pain control as warranted -Lipid profile ordered -N.p.o. except medications  2.  Intractable abdominal pain -Secondary to pancreatitis -See further plans above  3.  AKI -Likely in the setting of hypovolemia, poor p.o. intake with impaired glomerular hemodynamics as she was on NSAIDs -Creatinine on admission 2.1.  Baseline creatinine appears around 1 -Hold home NSAIDs -IV fluids as above -Strict I's and O's -Follow-up labs ordered  4.  UTI -Patient was recently diagnosed with UTI and has been on Cipro for only a few days -Continue Cipro  5.  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  Anxiety and depression -Continue home regiment  7.  Anemia -Hemoglobin 8.8 on admission, slightly lower than previous readings -History of iron deficiency anemia -Iron studies ordered -No transfusion at this time.  We will follow-up labs closely     DVT prophylaxis: Lovenox Code Status: DNR Family Communication: Sister bedside Disposition Plan: Anticipate discharge home with sister when medically optimized Consults called: None Admission status: Inpatient    Medical decision making: Patient seen at 11:37 PM on 06/15/2020.  The patient was discussed with ER provider.  What exists of the patient's chart was reviewed in depth and summarized above.  Clinical condition: Watcher.        Doran Heater [facility] Please page though [place] or Epic secure chat:  For password, contact charge nurse

## 2020-06-15 NOTE — ED Triage Notes (Signed)
Pts sister reports pt has had severe abdominal pain over the past few days. Pt s sister states that pt had scans and labs performed today that showed severe anemia, pancreatitis, and kidney disease.

## 2020-06-15 NOTE — H&P (Incomplete)
History and Physical  Patient Name: Kelly Curtis     KGY:[id number]    DOB: [birth year]    DOA: 06/15/2020 PCP: [name], MD  Patient coming from: Home  Chief Complaint: Intractable abdominal pain    HPI: Kelly Curtis is a 75 y.o. female, with PMH of *** who presented to the ER on *** with ***  ***    ED course: -Vitals on admission: -Labs on initial presentation: -Imaging obtained on admission: -In the ED the patient was given ***, and the hospitalist service was contacted for further evaluation and management.     ROS: A complete and thorough 12 point review of systems obtained, negative listed in HPI.     Past Medical History:  Diagnosis Date  . Arthritis   . Cancer ([place])    skin cancer x 3  . Chronic kidney disease   . Constipation due to pain medication   . Depression with anxiety   . DVT (deep venous thrombosis) [facility])    age 54ish  . Dysrhythmia    PVCs; perioperative PAF with MV repair ~ 2000   . 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 GERD (gastroesophageal reflux disease)   . Headache    migraine heads- 4 times a year  . History of blood transfusion   . Hypotension   . 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 AUGMENTATION MAMMAPLASTY Bilateral   . CHOLECYSTECTOMY N/A 12/12/2019   Procedure: LAPAROSCOPIC CHOLECYSTECTOMY;  Surgeon: [name], MD;  Location: [place];  Service: General;  Laterality: N/A;  . COLONOSCOPY    . ENDOSCOPIC RETROGRADE CHOLANGIOPANCREATOGRAPHY (ERCP) WITH PROPOFOL N/A 09/17/2019   Procedure: ENDOSCOPIC RETROGRADE CHOLANGIOPANCREATOGRAPHY (ERCP) WITH PROPOFOL;  Surgeon: Clarene Essex, MD;  Location: [facility];  Service: Endoscopy;  Laterality: N/A;  . ESOPHAGOGASTRODUODENOSCOPY (EGD) WITH PROPOFOL N/A 09/06/2019   Procedure: ESOPHAGOGASTRODUODENOSCOPY (EGD) WITH PROPOFOL;  Surgeon: Ronnette Juniper, MD;  Location: [facility]  ENDOS[facility];  Service: Gastroenterology;  Laterality: N/A;  . EYE SURGERY Bilateral    cataract  . IRRIGATION AND DEBRIDEMENT ABSCESS N/A 02/10/2020   Procedure: IRRIGATION AND DEBRIDEMENT ABSCESS;  Surgeon: Clovis Riley, MD;  Location: [facility];  Service: General;  Laterality: N/A;  . MITRAL VALVE REPAIR    . REMOVAL OF STONES  09/17/2019   Procedure: REMOVAL OF STONES;  Surgeon: Clarene Essex, MD;  Location: [facility];  Service: Endoscopy;;  . [name]  09/17/2019   Procedure: [name];  Surgeon: Clarene Essex, MD;  Location: [facility];  Service: Endoscopy;;  . TOTAL SHOULDER ARTHROPLASTY Right 2013    Social History: Patient lives ***.  The patient walks ***.  ***smoker.  Allergies  Allergen Reactions  . Pregabalin     Other reaction(s): Other, Suicidal ideations Suicidal ideations   . Demerol  [Meperidine Hcl] Nausea And Vomiting  . Ferrous Sulfate Nausea And Vomiting    Other reaction(s): upset stomach  . Gabapentin Other (See Comments)    Suicidal ideation (03/23/18 - pt currently is getting small amount in pain pump with no reaction)  . Hydroxyzine Other (See Comments)    hallucinations  . Macrolides And Ketolides Swelling    Other reaction(s): Significant swelling  . Midazolam Other (See Comments)    Bradycardia  . Trazodone And Nefazodone Other (See Comments)    dizziness  . Ambien [Zolpidem] Other (See Comments)    unknown  . Latex  Other reaction(s): rash  . Escitalopram Oxalate Other (See Comments)    confusion  . Penicillins Rash    DID THE REACTION INVOLVE: Swelling of the face/tongue/throat, SOB, or low BP? No Sudden or severe rash/hives, skin peeling, or the inside of the mouth or nose? Yes Did it require medical treatment? Yes When did it last happen?75 years old If all above answers are "NO", may proceed with cephalosporin use.  . Vancomycin Swelling and Rash    Family history: family history includes Alzheimer's disease in her  mother; Heart disease in her brother and mother; Kidney disease in her father; Melanoma in her sister.  Prior to Admission medications   Medication Sig Start Date End Date Taking? Authorizing Provider  acetaminophen (TYLENOL) 500 MG tablet Take 1,000 mg by mouth every 6 (six) hours as needed for moderate pain.   Yes [provider]  aspirin EC 81 MG tablet Take 81 mg by mouth daily as needed.   Yes [provider]  buPROPion (WELLBUTRIN XL) 150 MG 24 hr tablet Take 150 mg by mouth daily.    Yes [provider]  busPIRone (BUSPAR) 5 MG tablet Take 5 mg by mouth 2 (two) times daily. 06/04/20  Yes [provider]  calcium carbonate (TUMS - DOSED IN MG ELEMENTAL CALCIUM) 500 MG chewable tablet Chew 500 mg by mouth daily as needed for indigestion or heartburn.   Yes [provider]  cetirizine (ZYRTEC) 10 MG tablet Take 10 mg by mouth daily.   Yes [provider]  ciprofloxacin (CIPRO) 250 MG tablet Take 250 mg by mouth 2 (two) times daily. [place] date : 06/12/20   Yes [provider]  cycloSPORINE (RESTASIS) 0.05 % ophthalmic emulsion Place 1 drop into both eyes 2 (two) times daily.   Yes [provider]  dimenhyDRINATE (DRAMAMINE) 50 MG tablet Take 50 mg by mouth every 8 (eight) hours as needed for nausea or dizziness.   Yes [provider]  diphenhydrAMINE (BENADRYL) 25 MG tablet Take 25 mg by mouth every 6 (six) hours as needed for itching or sleep.   Yes [provider]  fluticasone (FLONASE) 50 MCG/ACT nasal spray Place 1 spray into both nostrils daily as needed for allergies or rhinitis.   Yes [provider]  furosemide (LASIX) 20 MG tablet Take 20 mg by mouth 2 (two) times daily as needed for fluid.   Yes [provider]  glycerin SUPP Place 1 Chip rectally daily as needed for moderate constipation.   Yes [provider]  HYDROmorphone (DILAUDID) 2 MG tablet Take 2 mg by mouth every  4 (four) hours as needed for severe pain.   Yes [provider]  levETIRAcetam (KEPPRA) 250 MG tablet Take 250 mg by mouth 2 (two) times daily.  12/17/19 06/15/20 Yes [provider]  Lidocaine-Glycerin (PREPARATION H EX) Apply 1 application topically daily.   Yes [provider]  Loteprednol Etabonate 0.5 % EMUL Place 1 drop into both eyes 2 (two) times daily.   Yes [provider]  melatonin 5 MG TABS Take 5 mg by mouth at bedtime.   Yes [provider]  methadone (DOLOPHINE) 10 MG tablet Take 10 mg by mouth every 8 (eight) hours.    Yes [provider]  midodrine (PROAMATINE) 10 MG tablet Take 10 mg by mouth 3 (three) times daily.   Yes [provider]  mirtazapine (REMERON) 15 MG tablet Take 7.5 mg by mouth at bedtime.   Yes  [provider]  naloxone [facility]) nasal spray 4 mg/0.1 mL Place 1 spray into the nose once as needed (opiod overdose).   Yes [provider]  naproxen (NAPROSYN) 500 MG tablet Take 500 mg by mouth 2 (two) times daily.   Yes [provider]  omeprazole (PRILOSEC) 20 MG capsule Take 20 mg by mouth daily.   Yes [provider]  PARoxetine (PAXIL) 10 MG tablet Take 10 mg by mouth daily.    Yes [provider]  Pediatric Multivitamins-Iron (FLINTSTONES PLUS IRON PO) Take 1 tablet by mouth daily.   Yes [provider]  polyethylene glycol (MIRALAX / GLYCOLAX) 17 g packet Take 17 g by mouth daily.   Yes [provider]  polyvinyl alcohol (LIQUIFILM TEARS) 1.4 % ophthalmic solution Place 1 drop into both eyes 3 (three) times daily as needed for dry eyes.    Yes [provider]  Suvorexant (BELSOMRA) 10 MG TABS Take 10 mg by mouth daily.   Yes [provider]  tiZANidine (ZANAFLEX) 4 MG tablet Take 4 mg by mouth 3 (three) times daily as needed for muscle spasms.    Yes [provider]  traMADol (ULTRAM) 50 MG tablet Take 1 tablet (50  mg total) by mouth every 6 (six) hours as needed. Patient not taking: No sig reported 12/12/19 12/11/20  [name], MD       Physical Exam: BP 104/60   Pulse (!) 111   Temp (!) 97.3 F (36.3 C) (Oral)   Resp (!) 23   SpO2 100%   General appearance: Well-developed, adult female, alert and in no acute distress .   Eyes: Anicteric, conjunctiva pink, lids and lashes normal. PERRL.    ENT: No nasal deformity, discharge, epistaxis.  Hearing intact. OP moist without lesions.   Neck: No neck masses.  Trachea midline.  No thyromegaly/tenderness. Lymph: No cervical or supraclavicular lymphadenopathy. Skin: Warm and dry.  No jaundice.  No suspicious rashes or lesions. Cardiac: RRR, nl S1-S2, no murmurs appreciated.  No LE edema.  Radial and pedal pulses 2+ and symmetric. Respiratory: Normal respiratory rate and rhythm.  CTAB without rales or wheezes. Abdomen: Abdomen soft.  No tenderness with palpation. No ascites, distension, hepatosplenomegaly.   MSK: No deformities or effusions of the large joints of the upper or lower extremities bilaterally.  No cyanosis or clubbing. Neuro: Cranial nerves 2 through 12 grossly intact.  Sensation intact to light touch. Speech is fluent.  Marland Kitchen    Psych: Sensorium intact and responding to questions, attention normal.  Behavior appropriate.  Judgment and insight appear normal.    Labs on Admission:  I have personally reviewed following labs and imaging studies: CBC: Recent Labs  Lab 06/15/20 2041  WBC 6.9  NEUTROABS 5.4  HGB 8.8*  HCT 30.0*  MCV 87.5  PLT 160   Basic Metabolic Panel: Recent Labs  Lab 06/15/20 2041  NA 141  K 4.0  CL 102  CO2 27  GLUCOSE 91  BUN 49*  CREATININE 2.12*  CALCIUM 9.0   GFR: CrCl cannot be calculated (Unknown ideal weight.).  Liver Function Tests: Recent Labs  Lab 06/15/20 2041  AST 37  ALT 24  ALKPHOS 174*  BILITOT 0.9  PROT 6.1*  ALBUMIN 3.1*   Recent Labs  Lab 06/15/20 2041  LIPASE 3,205*    No results for input(s): AMMONIA in the last 168 hours. Coagulation Profile: No results for input(s): INR, PROTIME in the last 168 hours. Cardiac Enzymes: No  results for input(s): CKTOTAL, CKMB, CKMBINDEX, TROPONINI in the last 168 hours. BNP (last 3 results) No results for input(s): PROBNP in the last 8760 hours. HbA1C: No results for input(s): HGBA1C in the last 72 hours. CBG: No results for input(s): GLUCAP in the last 168 hours. Lipid Profile: No results for input(s): CHOL, HDL, LDLCALC, TRIG, CHOLHDL, LDLDIRECT in the last 72 hours. Thyroid Function Tests: No results for input(s): TSH, T4TOTAL, FREET4, T3FREE, THYROIDAB in the last 72 hours. Anemia Panel: No results for input(s): VITAMINB12, FOLATE, FERRITIN, TIBC, IRON, RETICCTPCT in the last 72 hours.   No results found for this or any previous visit (from the past 240 hour(s)).         Radiological Exams on Admission: Personally reviewed imaging which shows: CT ABDOMEN PELVIS WO CONTRAST  Result Date: 06/15/2020 CLINICAL DATA:  Lower abdominal pain for the past 2 days. EXAM: CT ABDOMEN AND PELVIS WITHOUT CONTRAST TECHNIQUE: Multidetector CT imaging of the abdomen and pelvis was performed following the standard protocol without IV contrast. Creatinine was obtained on site at [place] at [address] Results: Creatinine 1.9 mg/dL. COMPARISON:  CT abdomen pelvis dated February 06, 2020. FINDINGS: Lower chest: No acute abnormality. Atelectasis at the left lung base, predominantly within the lingula. Hepatobiliary: No focal liver abnormality is seen. Status post cholecystectomy. No biliary dilatation. Pancreas: Peripancreatic inflammatory changes predominantly involving the pancreatic head. No ductal dilatation. Spleen: Normal in size without focal abnormality. Adrenals/Urinary Tract: Adrenal glands are unremarkable. Mild bilateral renal atrophy. No renal calculi or hydronephrosis. The bladder is unremarkable for  the degree of distention. Stomach/Bowel: Stomach is within normal limits. Diminutive or absent appendix. Reactive wall thickening of the second portion of the duodenum. No obstruction. Left-sided colonic diverticulosis. Vascular/Lymphatic: Aortic atherosclerosis. No enlarged abdominal or pelvic lymph nodes. Reproductive: Status post hysterectomy. No adnexal masses. Other: New trace free fluid in the pelvis.  No pneumoperitoneum. Musculoskeletal: No acute or significant osseous findings. 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 IMPRESSION: 1. Acute pancreatitis. 2. Aortic Atherosclerosis (ICD10-I70.0). Electronically Signed   By: Titus Dubin M.D.   On: 06/15/2020 15:10    EKG: Independently reviewed. ***       Assessment/Plan   ***  ***  ***  ***  ***  ***  ***  ***  ***  ***  ***  ***     DVT prophylaxis: ***  Code Status: ***  Family Communication: ***  Disposition Plan: Anticipate discharge *** when medically optimized Consults called: *** Admission status: ***    Medical decision making: Patient seen at 11:37 PM on 06/15/2020.  The patient was discussed with ***.  What exists of the patient's chart was reviewed in depth and summarized above.  Clinical condition: ***.        Doran Heater [facility] Please page though [place] or Epic secure chat:  For password, contact charge nurse

## 2020-06-15 NOTE — ED Provider Notes (Signed)
[facility] DEPT Provider Note   CSN: [id number] Arrival date & time: 06/15/20  1820     History Chief Complaint  Patient presents with  . Abdominal Pain  . Abnormal Lab    Kelly Curtis is a 75 y.o. female.  Patient presents ER chief complaint of abdominal pain abnormal labs.  She states that she been having epigastric abdominal pain for the past 2 to 3 days.  She saw her primary care doctor who had a CT scan done and labs done just today which were concerning and she was sent to the ER for further evaluation.  She otherwise denies any vomiting.  No diarrhea.  No blood in the stool or sputum.  She has a history of complicated gallbladder surgery with abscess formation last year, now on pain medications Dilaudid and methadone at home.        Past Medical History:  Diagnosis Date  . Arthritis   . Cancer ([place])    skin cancer x 3  . Chronic kidney disease   . Constipation due to pain medication   . Depression with anxiety   . DVT (deep venous thrombosis) [facility])    age 23ish  . Dysrhythmia    PVCs; perioperative PAF with MV repair ~ 2000   . 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 GERD (gastroesophageal reflux disease)   . Headache    migraine heads- 4 times a year  . History of blood transfusion   . Hypotension   . 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 Pancreatitis 06/15/2020  . Abdominal wall abscess 02/09/2020  . Chronic pain 02/09/2020  . AKI (acute kidney injury) ([place]) 03/24/2018  . HTN (hypertension) 03/24/2018  . Acute on chronic diastolic CHF (congestive heart failure) ([place]) 03/23/2018  . Microcytic anemia 03/23/2018  . Leg swelling 03/23/2018  . 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 GERD (gastroesophageal reflux disease)   . Depression with anxiety     Past Surgical History:  Procedure Laterality Date  . AUGMENTATION MAMMAPLASTY  Bilateral   . CHOLECYSTECTOMY N/A 12/12/2019   Procedure: LAPAROSCOPIC CHOLECYSTECTOMY;  Surgeon: [name], MD;  Location: [facility];  Service: General;  Laterality: N/A;  . COLONOSCOPY    . ENDOSCOPIC RETROGRADE CHOLANGIOPANCREATOGRAPHY (ERCP) WITH PROPOFOL N/A 09/17/2019   Procedure: ENDOSCOPIC RETROGRADE CHOLANGIOPANCREATOGRAPHY (ERCP) WITH PROPOFOL;  Surgeon: Clarene Essex, MD;  Location: [facility];  Service: Endoscopy;  Laterality: N/A;  . ESOPHAGOGASTRODUODENOSCOPY (EGD) WITH PROPOFOL N/A 09/06/2019   Procedure: ESOPHAGOGASTRODUODENOSCOPY (EGD) WITH PROPOFOL;  Surgeon: Ronnette Juniper, MD;  Location: [facility];  Service: Gastroenterology;  Laterality: N/A;  . EYE SURGERY Bilateral    cataract  . IRRIGATION AND DEBRIDEMENT ABSCESS N/A 02/10/2020   Procedure: IRRIGATION AND DEBRIDEMENT ABSCESS;  Surgeon: Clovis Riley, MD;  Location: [facility];  Service: General;  Laterality: N/A;  . MITRAL VALVE REPAIR    . REMOVAL OF STONES  09/17/2019   Procedure: REMOVAL OF STONES;  Surgeon: Clarene Essex, MD;  Location: [facility];  Service: Endoscopy;;  . [name]  09/17/2019   Procedure: [name];  Surgeon: Clarene Essex, MD;  Location: [facility];  Service: Endoscopy;;  . TOTAL SHOULDER ARTHROPLASTY Right 2013     OB History   No obstetric history on file.     Family History  Problem Relation Age of Onset  . Heart disease Mother   .  Alzheimer's disease Mother   . Kidney disease Father   . Melanoma Sister   . Heart disease Brother     Social History   Tobacco Use  . Smoking status: Former Smoker    Years: 32.00    Types: Cigarettes    Quit date: 2000    Years since quitting: 22.2  . Smokeless tobacco: Never Used  Vaping Use  . Vaping Use: Never used  Substance Use Topics  . Alcohol use: Never  . Drug use: Never    Home Medications Prior to Admission medications   Medication Sig Start Date End Date Taking? Authorizing Provider  acetaminophen (TYLENOL) 500 MG  tablet Take 1,000 mg by mouth every 6 (six) hours as needed for moderate pain.   Yes [provider]  aspirin EC 81 MG tablet Take 81 mg by mouth daily as needed.   Yes [provider]  buPROPion (WELLBUTRIN XL) 150 MG 24 hr tablet Take 150 mg by mouth daily.    Yes [provider]  busPIRone (BUSPAR) 5 MG tablet Take 5 mg by mouth 2 (two) times daily. 06/04/20  Yes [provider]  calcium carbonate (TUMS - DOSED IN MG ELEMENTAL CALCIUM) 500 MG chewable tablet Chew 500 mg by mouth daily as needed for indigestion or heartburn.   Yes [provider]  cetirizine (ZYRTEC) 10 MG tablet Take 10 mg by mouth daily.   Yes [provider]  ciprofloxacin (CIPRO) 250 MG tablet Take 250 mg by mouth 2 (two) times daily. [place] date : 06/12/20   Yes [provider]  cycloSPORINE (RESTASIS) 0.05 % ophthalmic emulsion Place 1 drop into both eyes 2 (two) times daily.   Yes [provider]  dimenhyDRINATE (DRAMAMINE) 50 MG tablet Take 50 mg by mouth every 8 (eight) hours as needed for nausea or dizziness.   Yes [provider]  diphenhydrAMINE (BENADRYL) 25 MG tablet Take 25 mg by mouth every 6 (six) hours as needed for itching or sleep.   Yes [provider]  fluticasone (FLONASE) 50 MCG/ACT nasal spray Place 1 spray into both nostrils daily as needed for allergies or rhinitis.   Yes [provider]  furosemide (LASIX) 20 MG tablet Take 20 mg by mouth 2 (two) times daily as needed for fluid.   Yes [provider]  glycerin SUPP Place 1 Chip rectally daily as needed for moderate constipation.   Yes [provider]  HYDROmorphone (DILAUDID) 2 MG tablet Take 2 mg by mouth every 4 (four) hours as needed for severe pain.   Yes [provider]  levETIRAcetam (KEPPRA) 250 MG tablet Take 250 mg by mouth 2 (two) times daily.  12/17/19 06/15/20 Yes [provider]  Lidocaine-Glycerin (PREPARATION H  EX) Apply 1 application topically daily.   Yes [provider]  Loteprednol Etabonate 0.5 % EMUL Place 1 drop into both eyes 2 (two) times daily.   Yes [provider]  melatonin 5 MG TABS Take 5 mg by mouth at bedtime.   Yes [provider]  methadone (DOLOPHINE) 10 MG tablet Take 10 mg by mouth every 8 (eight) hours.    Yes [provider]  midodrine (PROAMATINE) 10 MG tablet Take 10 mg by mouth 3 (three) times daily.   Yes [provider]  mirtazapine (REMERON) 15 MG tablet Take 7.5 mg by mouth at bedtime.   Yes [provider]  naloxone (NARCAN) nasal spray 4 mg/0.1 mL Place 1 spray into the  nose once as needed (opiod overdose).   Yes [provider]  naproxen (NAPROSYN) 500 MG tablet Take 500 mg by mouth 2 (two) times daily.   Yes [provider]  omeprazole (PRILOSEC) 20 MG capsule Take 20 mg by mouth daily.   Yes [provider]  PARoxetine (PAXIL) 10 MG tablet Take 10 mg by mouth daily.    Yes [provider]  Pediatric Multivitamins-Iron (FLINTSTONES PLUS IRON PO) Take 1 tablet by mouth daily.   Yes [provider]  polyethylene glycol (MIRALAX / GLYCOLAX) 17 g packet Take 17 g by mouth daily.   Yes [provider]  polyvinyl alcohol (LIQUIFILM TEARS) 1.4 % ophthalmic solution Place 1 drop into both eyes 3 (three) times daily as needed for dry eyes.    Yes [provider]  Suvorexant (BELSOMRA) 10 MG TABS Take 10 mg by mouth daily.   Yes [provider]  tiZANidine (ZANAFLEX) 4 MG tablet Take 4 mg by mouth 3 (three) times daily as needed for muscle spasms.    Yes [provider]  linezolid (ZYVOX) 600 MG tablet Take 1 tablet (600 mg total) by mouth 2 (two) times daily. Patient not taking: No sig reported 02/11/20   Caren Griffins, MD  traMADol (ULTRAM) 50 MG tablet Take 1 tablet (50 mg total) by mouth every 6 (six) hours as needed. Patient not taking: No  sig reported 12/12/19 12/11/20  [name], MD    Allergies    Pregabalin, Demerol  [meperidine hcl], Ferrous sulfate, Gabapentin, Hydroxyzine, Macrolides and ketolides, Midazolam, Trazodone and nefazodone, Ambien [zolpidem], Latex, Escitalopram oxalate, Penicillins, and Vancomycin  Review of Systems   Review of Systems  HENT: Negative for ear pain.   Eyes: Negative for pain.  Respiratory: Negative for cough.   Cardiovascular: Negative for chest pain.  Gastrointestinal: Positive for abdominal pain.  Genitourinary: Negative for flank pain.  Musculoskeletal: Negative for back pain.  Skin: Negative for rash.  Neurological: Negative for headaches.    Physical Exam Updated Vital Signs BP 107/85   Pulse (!) 105   Temp (!) 97.3 F (36.3 C) (Oral)   Resp (!) 22   SpO2 99%   Physical Exam Constitutional:      General: She is not in acute distress.    Appearance: Normal appearance.  HENT:     Head: Normocephalic.     Nose: Nose normal.  Eyes:     Extraocular Movements: Extraocular movements intact.  Cardiovascular:     Rate and Rhythm: Normal rate.  Pulmonary:     Effort: Pulmonary effort is normal.  Abdominal:     Tenderness: There is generalized abdominal tenderness and tenderness in the epigastric area.  Musculoskeletal:        General: Normal range of motion.     Cervical back: Normal range of motion.  Neurological:     General: No focal deficit present.     Mental Status: She is alert. Mental status is at baseline.     ED Results / Procedures / Treatments   Labs (all labs ordered are listed, but only abnormal results are displayed) Labs Reviewed  CBC WITH DIFFERENTIAL/PLATELET - Abnormal; Notable for the following components:      Result Value   RBC 3.43 (*)    Hemoglobin 8.8 (*)    HCT 30.0 (*)    MCH 25.7 (*)    MCHC 29.3 (*)    RDW 19.9 (*)    nRBC 0.4 (*)  Lymphs Abs 0.6 (*)    All other components within normal limits  LIPASE, BLOOD - Abnormal;  Notable for the following components:   Lipase 3,205 (*)    All other components within normal limits  COMPREHENSIVE METABOLIC PANEL - Abnormal; Notable for the following components:   BUN 49 (*)    Creatinine, Ser 2.12 (*)    Total Protein 6.1 (*)    Albumin 3.1 (*)    Alkaline Phosphatase 174 (*)    GFR, Estimated 24 (*)    All other components within normal limits  SARS CORONAVIRUS 2 (TAT 6-24 HRS)  URINALYSIS, ROUTINE W REFLEX MICROSCOPIC    EKG None  Radiology CT ABDOMEN PELVIS WO CONTRAST  Result Date: 06/15/2020 CLINICAL DATA:  Lower abdominal pain for the past 2 days. EXAM: CT ABDOMEN AND PELVIS WITHOUT CONTRAST TECHNIQUE: Multidetector CT imaging of the abdomen and pelvis was performed following the standard protocol without IV contrast. Creatinine was obtained on site at [facility] at [address] Results: Creatinine 1.9 mg/dL. COMPARISON:  CT abdomen pelvis dated February 06, 2020. FINDINGS: Lower chest: No acute abnormality. Atelectasis at the left lung base, predominantly within the lingula. Hepatobiliary: No focal liver abnormality is seen. Status post cholecystectomy. No biliary dilatation. Pancreas: Peripancreatic inflammatory changes predominantly involving the pancreatic head. No ductal dilatation. Spleen: Normal in size without focal abnormality. Adrenals/Urinary Tract: Adrenal glands are unremarkable. Mild bilateral renal atrophy. No renal calculi or hydronephrosis. The bladder is unremarkable for the degree of distention. Stomach/Bowel: Stomach is within normal limits. Diminutive or absent appendix. Reactive wall thickening of the second portion of the duodenum. No obstruction. Left-sided colonic diverticulosis. Vascular/Lymphatic: Aortic atherosclerosis. No enlarged abdominal or pelvic lymph nodes. Reproductive: Status post hysterectomy. No adnexal masses. Other: New trace free fluid in the pelvis.  No pneumoperitoneum. Musculoskeletal: No acute or  significant osseous findings. 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 IMPRESSION: 1. Acute pancreatitis. 2. Aortic Atherosclerosis (ICD10-I70.0). Electronically Signed   By: Titus Dubin M.D.   On: 06/15/2020 15:10    Procedures Procedures   Medications Ordered in ED Medications  sodium chloride 0.9 % bolus 1,000 mL (has no administration in time range)  morphine 4 MG/ML injection 4 mg (4 mg Intravenous Given 06/15/20 2042)  ondansetron (ZOFRAN) injection 4 mg (4 mg Intravenous Given 06/15/20 2041)  sodium chloride 0.9 % bolus 1,000 mL (1,000 mLs Intravenous New Bag/Given 06/15/20 2041)  HYDROmorphone (DILAUDID) injection 1 mg (1 mg Intravenous Given 06/15/20 2119)    ED Course  I have reviewed the triage vital signs and the nursing notes.  Pertinent labs & imaging results that were available during my care of the patient were reviewed by me and considered in my medical decision making (see chart for details).    MDM Rules/Calculators/A&P                          Labs show white count of 6 hemoglobin 8.8.  Chemistry shows creatinine of 2.2 lipase of 3000.  Patient given 2 L of fluids and IV Dilaudid for pain management.  Given her findings of acute pancreatitis, patient meets with hospitalist team.   Final Clinical Impression(s) / ED Diagnoses Final diagnoses:  Acute pancreatitis, unspecified complication status, unspecified pancreatitis type  AKI (acute kidney injury) ([place])  Anemia, unspecified type    Rx / DC Orders ED Discharge Orders    None       [place],  Greggory Brandy, MD 06/15/20 2240

## 2020-06-16 DIAGNOSIS — I5032 Chronic diastolic (congestive) heart failure: Secondary | ICD-10-CM | POA: Diagnosis present

## 2020-06-16 DIAGNOSIS — R109 Unspecified abdominal pain: Secondary | ICD-10-CM

## 2020-06-16 DIAGNOSIS — D509 Iron deficiency anemia, unspecified: Secondary | ICD-10-CM | POA: Diagnosis present

## 2020-06-16 DIAGNOSIS — D649 Anemia, unspecified: Secondary | ICD-10-CM

## 2020-06-16 DIAGNOSIS — N390 Urinary tract infection, site not specified: Secondary | ICD-10-CM | POA: Diagnosis present

## 2020-06-16 LAB — LIPID PANEL
Cholesterol: 144 mg/dL (ref 0–200)
HDL: 20 mg/dL — ABNORMAL LOW (ref >40)
LDL Cholesterol: 96 mg/dL (ref 0–99)
Total CHOL/HDL Ratio: 7.2 RATIO
Triglycerides: 138 mg/dL (ref <150)
VLDL: 28 mg/dL (ref 0–40)

## 2020-06-16 LAB — COMPREHENSIVE METABOLIC PANEL
ALT: 23 U/L (ref 0–44)
AST: 36 U/L (ref 15–41)
Albumin: 2.7 g/dL — ABNORMAL LOW (ref 3.5–5.0)
Alkaline Phosphatase: 168 U/L — ABNORMAL HIGH (ref 38–126)
Anion gap: 10 (ref 5–15)
BUN: 49 mg/dL — ABNORMAL HIGH (ref 8–23)
CO2: 25 mmol/L (ref 22–32)
Calcium: 8.4 mg/dL — ABNORMAL LOW (ref 8.9–10.3)
Chloride: 106 mmol/L (ref 98–111)
Creatinine, Ser: 2.05 mg/dL — ABNORMAL HIGH (ref 0.44–1.00)
GFR, Estimated: 25 mL/min — ABNORMAL LOW (ref >60)
Glucose, Bld: 77 mg/dL (ref 70–99)
Potassium: 4.3 mmol/L (ref 3.5–5.1)
Sodium: 141 mmol/L (ref 135–145)
Total Bilirubin: 0.9 mg/dL (ref 0.3–1.2)
Total Protein: 5.3 g/dL — ABNORMAL LOW (ref 6.5–8.1)

## 2020-06-16 LAB — CBC
HCT: 27.1 % — ABNORMAL LOW (ref 36.0–46.0)
Hemoglobin: 8 g/dL — ABNORMAL LOW (ref 12.0–15.0)
MCH: 26 pg (ref 26.0–34.0)
MCHC: 29.5 g/dL — ABNORMAL LOW (ref 30.0–36.0)
MCV: 88 fL (ref 80.0–100.0)
Platelets: 209 10*3/uL (ref 150–400)
RBC: 3.08 MIL/uL — ABNORMAL LOW (ref 3.87–5.11)
RDW: 20.1 % — ABNORMAL HIGH (ref 11.5–15.5)
WBC: 5.9 10*3/uL (ref 4.0–10.5)
nRBC: 0.3 % — ABNORMAL HIGH (ref 0.0–0.2)

## 2020-06-16 LAB — MAGNESIUM: Magnesium: 1.9 mg/dL (ref 1.7–2.4)

## 2020-06-16 LAB — IRON AND TIBC
Iron: 29 ug/dL (ref 28–170)
Saturation Ratios: 6 % — ABNORMAL LOW (ref 10.4–31.8)
TIBC: 461 ug/dL — ABNORMAL HIGH (ref 250–450)
UIBC: 432 ug/dL

## 2020-06-16 LAB — PHOSPHORUS: Phosphorus: 4.8 mg/dL — ABNORMAL HIGH (ref 2.5–4.6)

## 2020-06-16 LAB — FERRITIN: Ferritin: 25 ng/mL (ref 11–307)

## 2020-06-16 LAB — LIPASE, BLOOD: Lipase: 4767 U/L — ABNORMAL HIGH (ref 11–51)

## 2020-06-16 LAB — SARS CORONAVIRUS 2 (TAT 6-24 HRS): SARS Coronavirus 2: NEGATIVE

## 2020-06-16 MED ORDER — METHOCARBAMOL 1000 MG/10ML IJ SOLN
500.0000 mg | Freq: Three times a day (TID) | INTRAVENOUS | Status: DC | PRN
  Filled 2020-06-16: qty 5

## 2020-06-16 MED ORDER — SODIUM CHLORIDE 0.9 % IV SOLN
250.0000 mg | Freq: Two times a day (BID) | INTRAVENOUS | Status: DC
  Administered 2020-06-16 – 2020-06-19 (×6): 250 mg via INTRAVENOUS
  Filled 2020-06-16 (×7): qty 2.5

## 2020-06-16 MED ORDER — ORAL CARE MOUTH RINSE
15.0000 mL | Freq: Two times a day (BID) | OROMUCOSAL | Status: DC
  Administered 2020-06-17 – 2020-06-23 (×4): 15 mL via OROMUCOSAL

## 2020-06-16 MED ORDER — ALBUMIN HUMAN 25 % IV SOLN
50.0000 g | Freq: Once | INTRAVENOUS | Status: AC
  Administered 2020-06-16: 50 g via INTRAVENOUS
  Filled 2020-06-16: qty 200

## 2020-06-16 MED ORDER — SODIUM CHLORIDE 0.9 % IV BOLUS
1000.0000 mL | Freq: Once | INTRAVENOUS | Status: AC
  Administered 2020-06-16: 1000 mL via INTRAVENOUS

## 2020-06-16 MED ORDER — CHLORHEXIDINE GLUCONATE 0.12 % MT SOLN
15.0000 mL | Freq: Two times a day (BID) | OROMUCOSAL | Status: DC
  Administered 2020-06-16 – 2020-06-25 (×14): 15 mL via OROMUCOSAL
  Filled 2020-06-16 (×16): qty 15

## 2020-06-16 MED ORDER — DICLOFENAC SODIUM 1 % EX GEL
4.0000 g | Freq: Two times a day (BID) | CUTANEOUS | Status: DC
  Administered 2020-06-16 – 2020-06-25 (×17): 4 g via TOPICAL
  Filled 2020-06-16: qty 100

## 2020-06-16 MED ORDER — DIPHENHYDRAMINE HCL 50 MG/ML IJ SOLN: 25.0000 mg | Freq: Three times a day (TID) | INTRAMUSCULAR | Status: DC | PRN

## 2020-06-16 MED ORDER — PANTOPRAZOLE SODIUM 40 MG IV SOLR
40.0000 mg | INTRAVENOUS | Status: DC
  Administered 2020-06-16 – 2020-06-18 (×3): 40 mg via INTRAVENOUS
  Filled 2020-06-16 (×3): qty 40

## 2020-06-16 NOTE — Progress Notes (Signed)
   06/16/20 1335  Assess: MEWS Score  Temp (!) 94 F (34.4 C) (unable to obtain oral or axillary temp. rectal temp obtained at 1400.)  BP (!) 91/57  Pulse Rate 69  Resp 16  Level of Consciousness Alert  SpO2 100 %  O2 Device Nasal Cannula  O2 Flow Rate (L/min) 2 L/min  Assess: MEWS Score  MEWS Temp 2  MEWS Systolic 1  MEWS Pulse 0  MEWS RR 0  MEWS LOC 0  MEWS Score 3  MEWS Score Color Yellow  Assess: if the MEWS score is Yellow or Red  Were vital signs taken at a resting state? Yes  Focused Assessment Change from prior assessment (see assessment flowsheet)  Early Detection of Sepsis Score *See Row Information* Low  MEWS guidelines implemented *See Row Information* Yes  Treat  MEWS Interventions Escalated (See documentation below)  Pain Scale 0-10  Pain Score 9  Pain Intervention(s) MD notified (Comment)  Take Vital Signs  Increase Vital Sign Frequency  Yellow: Q 2hr X 2 then Q 4hr X 2, if remains yellow, continue Q 4hrs  Escalate  MEWS: Escalate Yellow: discuss with charge nurse/RN and consider discussing with provider and RRT  Notify: Charge Nurse/RN  Name of Charge Nurse/RN Notified Apolonio Schneiders  Date Charge Nurse/RN Notified 06/16/20  Time Charge Nurse/RN Notified 2536  Notify: Provider  Provider Name/Title MD Sherral Hammers  Date Provider Notified 06/16/20  Time Provider Notified 1405  Notification Type Page  Notification Reason Change in status  Provider response See new orders;En route  Date of Provider Response 06/16/20  Time of Provider Response 1410  Document  Patient Outcome Transferred/level of care increased  Progress note created (see row info) Yes

## 2020-06-16 NOTE — Progress Notes (Signed)
   06/16/20 0135  Assess: MEWS Score  Temp 97.9 F (36.6 C)  BP 118/73  Pulse Rate (!) 118  Resp 20  Level of Consciousness Alert  SpO2 100 %  Assess: MEWS Score  MEWS Temp 0  MEWS Systolic 0  MEWS Pulse 2  MEWS RR 0  MEWS LOC 0  MEWS Score 2  MEWS Score Color Yellow  Assess: if the MEWS score is Yellow or Red  Were vital signs taken at a resting state? Yes  Focused Assessment No change from prior assessment  Early Detection of Sepsis Score *See Row Information* Low  MEWS guidelines implemented *See Row Information* Yes  Treat  MEWS Interventions Administered prn meds/treatments  Pain Scale 0-10  Pain Score 9  Pain Type Acute pain  Pain Location Abdomen  Pain Orientation Right;Lower  Pain Frequency Rarely  Pain Onset On-going  Patients Stated Pain Goal 2  Pain Intervention(s) Medication (See eMAR)  Take Vital Signs  Increase Vital Sign Frequency  Yellow: Q 2hr X 2 then Q 4hr X 2, if remains yellow, continue Q 4hrs  Escalate  MEWS: Escalate Yellow: discuss with charge nurse/RN and consider discussing with provider and RRT  Notify: Charge Nurse/RN  Name of Charge Nurse/RN Notified Jose, RN  Date Charge Nurse/RN Notified 06/16/20  Time Charge Nurse/RN Notified 0135  Document  Patient Outcome Stabilized after interventions  Progress note created (see row info) Yes

## 2020-06-16 NOTE — Plan of Care (Signed)
  Problem: Clinical Measurements: Goal: Will remain free from infection Outcome: Progressing Goal: Diagnostic test results will improve Outcome: Progressing Goal: Respiratory complications will improve Outcome: Progressing   Problem: Activity: Goal: Risk for activity intolerance will decrease Outcome: Progressing   Problem: Coping: Goal: Level of anxiety will decrease Outcome: Progressing   Problem: Pain Managment: Goal: General experience of comfort will improve Outcome: Progressing

## 2020-06-16 NOTE — Progress Notes (Signed)
PROGRESS NOTE    Akyla Vavrek  YSA:[id number] DOB: [birth year] DOA: 06/15/2020 PCP: [name], MD     Brief Narrative:  Kelly Curtis is a 75 y.o. 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   Patient states over the past few days she has had intractable upper abdominal pain that has been significant.  She has had poor p.o. intake.  Urine output has diminished.  She has had some diarrhea.  She has a history of chronic pain however never this bad.  Some nausea but no vomiting.  She recently saw her PCP and was prescribed ciprofloxacin for UTI.  She also had CT ordered and was performed earlier today that demonstrated findings concerning for pancreatitis thus she presented to the ER for further evaluation management.  She has continued on her chronic pain medication and still having significant abdominal pain.  She has a history of complicated gallbladder surgery with previous abscess formation.  She does not drink alcohol.  Besides starting antibiotic for UTI, only other medication change was starting oral Dilaudid.    ED course: -Vitals on admission: Afebrile, heart rate 101, blood pressure 165/100, maintaining sats on room air -Labs on initial presentation: WBC 6.9, hemoglobin 8.8, sodium 141, potassium 4, chloride 102, bicarb 27, glucose 91, BUN 49, creatinine 2.1, lipase 3200 -Imaging obtained on admission: CT abdomen pelvis demonstrated acute pancreatitis -In the ED the patient was given pain medication, 2 L of NS, Zofran, and the hospitalist service was contacted for further evaluation and management.    Subjective: Afebrile overnight A/O x4, hypotensive.  Hands and feet erythematous and painful from her erythromelalgia.   Assessment & Plan: Covid vaccination; vaccinated 2/3   Active Problems:    Erythromelalgia (HCC)   GERD (gastroesophageal reflux disease)   AKI (acute kidney injury) ([place])   Chronic pain   Pancreatitis   Intractable abdominal pain   Acute lower UTI   Chronic diastolic CHF (congestive heart failure) (HCC)   Iron deficiency anemia   Severe idiopathic pancreatitis -Patient does not drink -Has had cholecystectomy; may have contributed to pancreatitis.  In addition heavy narcotic use for erythromelalgia may have also contributed to her pancreatitis Lab Results  Component Value Date   LIPASE 4,767 (H) 06/16/2020   LIPASE 3,205 (H) 06/15/2020   LIPASE 27 12/20/2019  -N.p.o.; attempt to limit p.o. medication -Lipid panel pending -Lactated Ringer's 323FT/DD  Chronic diastolic CHF -Patient with negative recent echo.  Given the significant amounts of fluids required will obtain updated echocardiogram. -Strict in and out -Daily weight  Hypotension -Midodrine 10 mg TID (hold) -Albumin 50 g x 1 + normal saline 1 L bolus  AKI (baseline Cr~1) -Likely in the setting of hypovolemia, poor p.o. intake with impaired glomerular hemodynamics as she was on NSAIDs -Creatinine on admission 2.1.   -Hold home NSAIDs Lab Results  Component Value Date   CREATININE 2.05 (H) 06/16/2020   CREATININE 2.12 (H) 06/15/2020   CREATININE 1.15 (H) 02/11/2020   CREATININE 1.35 (H) 02/09/2020   CREATININE 1.00 02/06/2020   UTI -Patient must have been diagnosed at outside facility -Patient supposedly has been on ciprofloxacin for a few days we will discontinue. -Monitor closely  Chronic pain secondary Erythromelalgia -Unfortunately this condition is exacerbated by heat, affects hands, feet and face. -Patient has agreed to try Voltaren ointment on feet and hands -Patient  has been on methadone.  Home regimen  Methadone 10 mg TID  Anxiety/depression -Continue home regimen  Anemia -Hemoglobin 8.8 on admission, slightly lower than previous readings -History of iron deficiency  anemia -Anemia panel pending    DVT prophylaxis: Lovenox Code Status: DNR Family Communication: 3/22 friend at bedside for discussion of plan of care all questions answered Status is: Inpatient    Dispo: The patient is from: Home              Anticipated d/c is to: Home              Anticipated d/c date is:??              Patient currently unstable      Consultants:    Procedures/Significant Events:    I have personally reviewed and interpreted all radiology studies and my findings are as above.  VENTILATOR SETTINGS:    Cultures   Antimicrobials:    Devices    LINES / TUBES:      Continuous Infusions: . lactated ringers 125 mL/hr at 06/16/20 1013  . levETIRAcetam    . methocarbamol (ROBAXIN) IV       Objective: Vitals:   06/16/20 1800 06/16/20 1811 06/16/20 1900 06/16/20 1907  BP: (!) 101/57  (!) 106/58   Pulse: 80  80   Resp: 19  18   Temp:  (!) 96.5 F (35.8 C)  (!) 96.8 F (36 C)  TempSrc:  Rectal  Rectal  SpO2: 96%  95%   Weight:      Height:        Intake/Output Summary (Last 24 hours) at 06/16/2020 1908 Last data filed at 06/16/2020 1551 Gross per 24 hour  Intake 3023.55 ml  Output --  Net 3023.55 ml   Filed Weights   06/16/20 1608  Weight: 77.4 kg    Examination:  General: A/O x4 No acute respiratory distress Eyes: negative scleral hemorrhage, negative anisocoria, negative icterus ENT: Negative Runny nose, negative gingival bleeding, Neck:  Negative scars, masses, torticollis, lymphadenopathy, JVD Lungs: Clear to auscultation bilaterally without wheezes or crackles Cardiovascular: Regular rate and rhythm without murmur gallop or rub normal S1 and S2 Abdomen: POSITIVE extreme abdominal pain, positive distention, hypoactive bowel sounds, no rebound, no ascites, no appreciable mass Extremities: hands and feet erythematous, painful to touch Skin: Negative rashes, lesions, ulcers Psychiatric:  Negative depression, negative  anxiety, negative fatigue, negative mania  Central nervous system:  Cranial nerves II through XII intact, tongue/uvula midline, all extremities muscle strength 5/5, sensation intact throughout, negative dysarthria, negative expressive aphasia, negative receptive aphasia.  .     Data Reviewed: Care during the described time interval was provided by me .  I have reviewed this patient's available data, including medical history, events of note, physical examination, and all test results as part of my evaluation.  CBC: Recent Labs  Lab 06/15/20 2041 06/16/20 0345  WBC 6.9 5.9  NEUTROABS 5.4  --   HGB 8.8* 8.0*  HCT 30.0* 27.1*  MCV 87.5 88.0  PLT 207 710   Basic Metabolic Panel: Recent Labs  Lab 06/15/20 2041 06/16/20 0345  NA 141 141  K 4.0 4.3  CL 102 106  CO2 27 25  GLUCOSE 91 77  BUN 49* 49*  CREATININE 2.12* 2.05*  CALCIUM 9.0 8.4*  MG  --  1.9  PHOS  --  4.8*   GFR: Estimated Creatinine Clearance: 25 mL/min (A) (by C-G formula based on SCr of 2.05  mg/dL (H)). Liver Function Tests: Recent Labs  Lab 06/15/20 2041 06/16/20 0345  AST 37 36  ALT 24 23  ALKPHOS 174* 168*  BILITOT 0.9 0.9  PROT 6.1* 5.3*  ALBUMIN 3.1* 2.7*   Recent Labs  Lab 06/15/20 2041 06/16/20 0345  LIPASE 3,205* 4,767*   No results for input(s): AMMONIA in the last 168 hours. Coagulation Profile: No results for input(s): INR, PROTIME in the last 168 hours. Cardiac Enzymes: No results for input(s): CKTOTAL, CKMB, CKMBINDEX, TROPONINI in the last 168 hours. BNP (last 3 results) No results for input(s): PROBNP in the last 8760 hours. HbA1C: No results for input(s): HGBA1C in the last 72 hours. CBG: No results for input(s): GLUCAP in the last 168 hours. Lipid Profile: Recent Labs    06/16/20 0345  CHOL 144  HDL 20*  LDLCALC 96  TRIG 138  CHOLHDL 7.2   Thyroid Function Tests: No results for input(s): TSH, T4TOTAL, FREET4, T3FREE, THYROIDAB in the last 72 hours. Anemia  Panel: Recent Labs    06/16/20 0345  FERRITIN 25  TIBC 461*  IRON 29   Sepsis Labs: No results for input(s): PROCALCITON, LATICACIDVEN in the last 168 hours.  Recent Results (from the past 240 hour(s))  SARS CORONAVIRUS 2 (TAT 6-24 HRS) Nasopharyngeal Nasopharyngeal Swab     Status: None   Collection Time: 06/15/20 10:20 PM   Specimen: Nasopharyngeal Swab  Result Value Ref Range Status   SARS Coronavirus 2 NEGATIVE NEGATIVE Final    Comment: (NOTE) SARS-CoV-2 target nucleic acids are NOT DETECTED.  The SARS-CoV-2 RNA is generally detectable in upper and lower respiratory specimens during the acute phase of infection. Negative results do not preclude SARS-CoV-2 infection, do not rule out co-infections with other pathogens, and should not be used as the sole basis for treatment or other patient management decisions. Negative results must be combined with clinical observations, patient history, and epidemiological information. The expected result is Negative.  Fact Sheet for Patients: SugarRoll.be  Fact Sheet for Healthcare Providers: https://www.-mathews.com/  This test is not yet approved or cleared by the [place] FDA and  has been authorized for detection and/or diagnosis of SARS-CoV-2 by FDA under an Emergency Use Authorization (EUA). This EUA will remain  in effect (meaning this test can be used) for the duration of the COVID-19 declaration under Se ction 564(b)(1) of the Act, 21 U.S.C. section 360bbb-3(b)(1), unless the authorization is terminated or revoked sooner.  Performed at [facility], [place] [address], [place], [place] [address]          Radiology Studies: CT ABDOMEN PELVIS WO CONTRAST  Result Date: 06/15/2020 CLINICAL DATA:  Lower abdominal pain for the past 2 days. EXAM: CT ABDOMEN AND PELVIS WITHOUT CONTRAST TECHNIQUE: Multidetector CT imaging of the abdomen and pelvis was performed following  the standard protocol without IV contrast. Creatinine was obtained on site at [facility] at [address] Results: Creatinine 1.9 mg/dL. COMPARISON:  CT abdomen pelvis dated February 06, 2020. FINDINGS: Lower chest: No acute abnormality. Atelectasis at the left lung base, predominantly within the lingula. Hepatobiliary: No focal liver abnormality is seen. Status post cholecystectomy. No biliary dilatation. Pancreas: Peripancreatic inflammatory changes predominantly involving the pancreatic head. No ductal dilatation. Spleen: Normal in size without focal abnormality. Adrenals/Urinary Tract: Adrenal glands are unremarkable. Mild bilateral renal atrophy. No renal calculi or hydronephrosis. The bladder is unremarkable for the degree of distention. Stomach/Bowel: Stomach is within normal limits. Diminutive or absent appendix. Reactive wall  thickening of the second portion of the duodenum. No obstruction. Left-sided colonic diverticulosis. Vascular/Lymphatic: Aortic atherosclerosis. No enlarged abdominal or pelvic lymph nodes. Reproductive: Status post hysterectomy. No adnexal masses. Other: New trace free fluid in the pelvis.  No pneumoperitoneum. Musculoskeletal: No acute or significant osseous findings. 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 IMPRESSION: 1. Acute pancreatitis. 2. Aortic Atherosclerosis (ICD10-I70.0). Electronically Signed   By: Titus Dubin M.D.   On: 06/15/2020 15:10        Scheduled Meds: . buPROPion  150 mg Oral Daily  . busPIRone  5 mg Oral BID  . chlorhexidine  15 mL Mouth Rinse BID  . cycloSPORINE  1 drop Both Eyes BID  . diclofenac Sodium  4 g Topical BID  . enoxaparin (LOVENOX) injection  30 mg Subcutaneous Q24H  . loteprednol  1 drop Both Eyes BID  . [START ON 06/17/2020] mouth rinse  15 mL Mouth Rinse q12n4p  . melatonin  5 mg Oral QHS  . methadone  10 mg Oral Q8H  . mirtazapine  7.5 mg Oral QHS  . pantoprazole (PROTONIX) IV   40 mg Intravenous Q24H  . PARoxetine  10 mg Oral Daily  . Suvorexant  10 mg Oral QHS   Continuous Infusions: . lactated ringers 125 mL/hr at 06/16/20 1013  . levETIRAcetam    . methocarbamol (ROBAXIN) IV       LOS: 1 day    Time spent:40 min    Bienvenido Proehl, Geraldo Docker, MD [facility]   If 7PM-7AM, please contact night-coverage 06/16/2020, 7:08 PM

## 2020-06-16 NOTE — Progress Notes (Signed)
PT Cancellation Note  Patient Details Name: Quin Mcpherson MRN: [id number] DOB: 1945/09/08   Cancelled Treatment:    Reason Eval/Treat Not Completed: Medical issues which prohibited therapy Per staff, pt with low temp and about to transfer to another unit.   Lauree Yurick,[name] 06/16/2020, 2:39 PM Jannette Spanner PT, DPT Acute Rehabilitation Services Pager: [phone] Office: [phone]

## 2020-06-17 ENCOUNTER — Inpatient Hospital Stay (HOSPITAL_COMMUNITY): Payer: Medicare Other

## 2020-06-17 DIAGNOSIS — I5022 Chronic systolic (congestive) heart failure: Secondary | ICD-10-CM | POA: Diagnosis not present

## 2020-06-17 LAB — COMPREHENSIVE METABOLIC PANEL
ALT: 30 U/L (ref 0–44)
AST: 51 U/L — ABNORMAL HIGH (ref 15–41)
Albumin: 3.3 g/dL — ABNORMAL LOW (ref 3.5–5.0)
Alkaline Phosphatase: 220 U/L — ABNORMAL HIGH (ref 38–126)
Anion gap: 7 (ref 5–15)
BUN: 40 mg/dL — ABNORMAL HIGH (ref 8–23)
CO2: 26 mmol/L (ref 22–32)
Calcium: 8.8 mg/dL — ABNORMAL LOW (ref 8.9–10.3)
Chloride: 111 mmol/L (ref 98–111)
Creatinine, Ser: 1.21 mg/dL — ABNORMAL HIGH (ref 0.44–1.00)
GFR, Estimated: 48 mL/min — ABNORMAL LOW (ref >60)
Glucose, Bld: 67 mg/dL — ABNORMAL LOW (ref 70–99)
Potassium: 4.7 mmol/L (ref 3.5–5.1)
Sodium: 144 mmol/L (ref 135–145)
Total Bilirubin: 1.4 mg/dL — ABNORMAL HIGH (ref 0.3–1.2)
Total Protein: 5.5 g/dL — ABNORMAL LOW (ref 6.5–8.1)

## 2020-06-17 LAB — CBC WITH DIFFERENTIAL/PLATELET
Abs Immature Granulocytes: 0.03 10*3/uL (ref 0.00–0.07)
Basophils Absolute: 0 10*3/uL (ref 0.0–0.1)
Basophils Relative: 0 %
Eosinophils Absolute: 0.1 10*3/uL (ref 0.0–0.5)
Eosinophils Relative: 1 %
HCT: 24.6 % — ABNORMAL LOW (ref 36.0–46.0)
Hemoglobin: 7.2 g/dL — ABNORMAL LOW (ref 12.0–15.0)
Immature Granulocytes: 0 %
Lymphocytes Relative: 8 %
Lymphs Abs: 0.6 10*3/uL — ABNORMAL LOW (ref 0.7–4.0)
MCH: 26 pg (ref 26.0–34.0)
MCHC: 29.3 g/dL — ABNORMAL LOW (ref 30.0–36.0)
MCV: 88.8 fL (ref 80.0–100.0)
Monocytes Absolute: 0.8 10*3/uL (ref 0.1–1.0)
Monocytes Relative: 11 %
Neutro Abs: 6.4 10*3/uL (ref 1.7–7.7)
Neutrophils Relative %: 80 %
Platelets: 170 10*3/uL (ref 150–400)
RBC: 2.77 MIL/uL — ABNORMAL LOW (ref 3.87–5.11)
RDW: 20.3 % — ABNORMAL HIGH (ref 11.5–15.5)
WBC: 8 10*3/uL (ref 4.0–10.5)
nRBC: 0.2 % (ref 0.0–0.2)

## 2020-06-17 LAB — PHOSPHORUS: Phosphorus: 3.6 mg/dL (ref 2.5–4.6)

## 2020-06-17 LAB — LIPID PANEL
Cholesterol: 101 mg/dL (ref 0–200)
HDL: 18 mg/dL — ABNORMAL LOW (ref >40)
LDL Cholesterol: 74 mg/dL (ref 0–99)
Total CHOL/HDL Ratio: 5.6 RATIO
Triglycerides: 47 mg/dL (ref <150)
VLDL: 9 mg/dL (ref 0–40)

## 2020-06-17 LAB — RETICULOCYTES
Immature Retic Fract: 23.7 % — ABNORMAL HIGH (ref 2.3–15.9)
RBC.: 2.77 MIL/uL — ABNORMAL LOW (ref 3.87–5.11)
Retic Count, Absolute: 105.3 10*3/uL (ref 19.0–186.0)
Retic Ct Pct: 3.8 % — ABNORMAL HIGH (ref 0.4–3.1)

## 2020-06-17 LAB — ECHOCARDIOGRAM COMPLETE
Area-P 1/2: 2.91 cm2
Height: 64 in
S' Lateral: 2.2 cm
Weight: 2811.31 oz

## 2020-06-17 LAB — VITAMIN B12: Vitamin B-12: 452 pg/mL (ref 180–914)

## 2020-06-17 LAB — IRON AND TIBC
Iron: 40 ug/dL (ref 28–170)
Saturation Ratios: 10 % — ABNORMAL LOW (ref 10.4–31.8)
TIBC: 413 ug/dL (ref 250–450)
UIBC: 373 ug/dL

## 2020-06-17 LAB — FERRITIN: Ferritin: 30 ng/mL (ref 11–307)

## 2020-06-17 LAB — LIPASE, BLOOD: Lipase: 1824 U/L — ABNORMAL HIGH (ref 11–51)

## 2020-06-17 LAB — MAGNESIUM: Magnesium: 2 mg/dL (ref 1.7–2.4)

## 2020-06-17 LAB — FOLATE: Folate: 9.9 ng/mL (ref >5.9)

## 2020-06-17 MED ORDER — ENOXAPARIN SODIUM 40 MG/0.4ML ~~LOC~~ SOLN
40.0000 mg | SUBCUTANEOUS | Status: DC
  Administered 2020-06-18 – 2020-06-25 (×7): 40 mg via SUBCUTANEOUS
  Filled 2020-06-17 (×7): qty 0.4

## 2020-06-17 NOTE — Evaluation (Signed)
Occupational Therapy Evaluation Patient Details Name: Kelly Curtis MRN: [id number] DOB: 11-27-1945 Today's Date: 06/17/2020    History of Present Illness 75 y.o. 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  Patient presented to the ED on 06/15/2020 with complaint of significant upper abdominal pain associated with poor oral intake, decreased urine output, nausea.  Pt admitted for severe acute pancreatitis   Clinical Impression   Kelly Curtis is a 75 year old woman who is typically independent with BADLs at home but needs assistance for shower transfers and for sit to supine. On evaluation patient presents with pain, generalized weakness and decreased activity tolerance resulting in min guard for standing and taking steps. Due to patient's pain in hands she is needing more assistance for LB dressing and toileting. Patient will benefit from skilled OT services while in hospital to improve deficits and learn compensatory strategies as needed in order to return to PLOF.  Should be able to return home at discharge.    Follow Up Recommendations  No OT follow up    Equipment Recommendations  None recommended by OT    Recommendations for Other Services       Precautions / Restrictions Precautions Precautions: Fall Precaution Comments: monitor vitals, lower legs severely discolor with dependent position, fell 2 weeks ago      Mobility Bed Mobility Overal bed mobility: Needs Assistance Bed Mobility: Supine to Sit;Sit to Supine     Supine to sit: Min guard Sit to supine: Mod assist   General bed mobility comments: assist for lines, LEs required assist to return to bed    Transfers Overall transfer level: Needs assistance Equipment used: None Transfers: Sit to/from Stand;Stand Pivot Transfers Sit to Stand: Min guard Stand pivot  transfers: Min guard       General transfer comment: close min/guard for safety, pt prefers no socks (due to heat); pt with HR elevating to 125 bpm with activity; SPO2 95% on 3L O2 [place], BP upon transfer back to bed: 134/48mmHg; pt reports dizziness with standing and mobilizing prior to admission - supposed to see an ENT    Balance Overall balance assessment: No apparent balance deficits (not formally assessed)                                         ADL either performed or assessed with clinical judgement   ADL Overall ADL's : Needs assistance/impaired Eating/Feeding: Set up   Grooming: Set up   Upper Body Bathing: Set up   Lower Body Bathing: Sit to/from stand;Minimal assistance   Upper Body Dressing : Set up;Sitting   Lower Body Dressing: Moderate assistance;Sit to/from stand   Toilet Transfer: Min guard;BSC;Stand-pivot   Toileting- Water quality scientist and Hygiene: Moderate assistance;Sit to/from stand       Functional mobility during ADLs: Min guard       Vision Patient Visual Report: No change from baseline       Perception     Praxis      Pertinent Vitals/Pain Pain Assessment: Faces Faces Pain Scale: Hurts little more Pain Location: feet, hands Pain Descriptors / Indicators: Grimacing;Guarding Pain Intervention(s): Limited activity within patient's tolerance;Monitored during session;Repositioned     Hand Dominance Right   Extremity/Trunk Assessment Upper Extremity Assessment Upper Extremity Assessment: RUE deficits/detail;LUE deficits/detail  RUE Deficits / Details: R shoulder 3+/5 (history of shoulder replacement), elbow 4/5, wrist 5/5, grossly able to open and clsoe (deferred hand testing due to pain), tremor in hand RUE Coordination: decreased fine motor LUE Deficits / Details: L shoulder 4/5, elbow 4/5, wrist 5/5, grossly able to open and clsoe (deferred hand testing due to pain), tremor in hand LUE Coordination: decreased fine  motor   Lower Extremity Assessment Lower Extremity Assessment: Defer to PT evaluation   Cervical / Trunk Assessment Cervical / Trunk Assessment: Normal   Communication Communication Communication: No difficulties   Cognition Arousal/Alertness: Awake/alert Behavior During Therapy: WFL for tasks assessed/performed Overall Cognitive Status: Within Functional Limits for tasks assessed                                     General Comments       Exercises     Shoulder Instructions      Home Living Family/patient expects to be discharged to:: Private residence Living Arrangements: Other relatives Available Help at Discharge: Family;Available 24 hours/day Type of Home: House Home Access: Level entry     Home Layout: One level     Bathroom Shower/Tub: Occupational psychologist: Standard     Home Equipment: Bedside commode;Walker - 2 wheels;Cane - single point;Tub bench;Wheelchair - manual          Prior Functioning/Environment Level of Independence: Needs assistance  Gait / Transfers Assistance Needed: Use of cane, RW or wc as needed depending on pain, tremors, weakness, assistance for sit to supine. ADL's / Homemaking Assistance Needed: independent with ADLs with set up, Assistance for transferring into shower            OT Problem List: Decreased strength;Decreased activity tolerance;Cardiopulmonary status limiting activity;Pain;Impaired UE functional use      OT Treatment/Interventions: Self-care/ADL training;Therapeutic exercise;Patient/family education;Therapeutic activities;DME and/or AE instruction    OT Goals(Current goals can be found in the care plan section) Acute Rehab OT Goals Patient Stated Goal: to go home at discharge OT Goal Formulation: With patient Time For Goal Achievement: 07/01/20 Potential to Achieve Goals: Good  OT Frequency: Min 2X/week   Barriers to D/C:            Co-evaluation   Reason for Co-Treatment:  For patient/therapist safety;To address functional/ADL transfers;Complexity of the patient's impairments (multi-system involvement) PT goals addressed during session: Mobility/safety with mobility OT goals addressed during session: ADL's and self-care      AM-PAC OT "6 Clicks" Daily Activity     Outcome Measure Help from another person eating meals?: A Little Help from another person taking care of personal grooming?: A Little Help from another person toileting, which includes using toliet, bedpan, or urinal?: A Lot Help from another person bathing (including washing, rinsing, drying)?: A Little Help from another person to put on and taking off regular upper body clothing?: A Little Help from another person to put on and taking off regular lower body clothing?: A Lot 6 Click Score: 16   End of Session Equipment Utilized During Treatment: Oxygen Nurse Communication: Mobility status  Activity Tolerance: Patient limited by pain Patient left: in bed;with call bell/phone within reach;with family/visitor present;with bed alarm set  OT Visit Diagnosis: History of falling (Z91.81);Pain;Muscle weakness (generalized) (M62.81)                Time: 2956-2130 OT Time Calculation (min): 22 min Charges:  OT General Charges $OT Visit: 1 Visit OT Evaluation $OT Eval Moderate Complexity: 1 Mod  Jolissa Kapral, OTR/L [place]  Office [phone] Pager: [place] 06/17/2020, 3:55 PM

## 2020-06-17 NOTE — Evaluation (Signed)
Physical Therapy Evaluation Patient Details Name: Kelly Curtis MRN: [id number] DOB: 28-Jul-1945 Today's Date: 06/17/2020   History of Present Illness  75 y.o. 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  Patient presented to the ED on 06/15/2020 with complaint of significant upper abdominal pain associated with poor oral intake, decreased urine output, nausea.  Pt admitted for severe acute pancreatitis  Clinical Impression  Pt admitted with above diagnosis.  Pt currently with functional limitations due to the deficits listed below (see PT Problem List). Pt will benefit from skilled PT to increase their independence and safety with mobility to allow discharge to the venue listed below.  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  Pt has equipment at home and family assist if needed.  No f/u therapy needs anticipated at this time.     Follow Up Recommendations No PT follow up    Equipment Recommendations  None recommended by PT    Recommendations for Other Services       Precautions / Restrictions Precautions Precautions: Fall Precaution Comments: monitor vitals, lower legs severely discolor with dependent position, fell 2 weeks ago      Mobility  Bed Mobility Overal bed mobility: Needs Assistance Bed Mobility: Supine to Sit;Sit to Supine     Supine to sit: Min guard Sit to supine: Mod assist   General bed mobility comments: assist for lines, LEs required assist to return to bed    Transfers Overall transfer level: Needs assistance Equipment used: None Transfers: Sit to/from Stand;Stand Pivot Transfers Sit to Stand: Min guard Stand pivot transfers: Min guard       General transfer comment: close min/guard for safety, pt prefers no  socks (due to heat); pt with HR elevating to 125 bpm with activity; SPO2 95% on 3L O2 [place], BP upon transfer back to bed: 134/67mmHg; pt reports dizziness with standing and mobilizing prior to admission - supposed to see an ENT  Ambulation/Gait             General Gait Details: not ambulating at baseline, typically uses W/C due to erythromelalgia  Stairs            Wheelchair Mobility    Modified Rankin (Stroke Patients Only)       Balance Overall balance assessment: No apparent balance deficits (not formally assessed)                                           Pertinent Vitals/Pain Pain Assessment: Faces Faces Pain Scale: Hurts little more Pain Location: feet, hands Pain Descriptors / Indicators: Grimacing;Guarding Pain Intervention(s): Limited activity within patient's tolerance;Monitored during session;Repositioned    Home Living Family/patient expects to be discharged to:: Private residence Living Arrangements: Other relatives Available Help at Discharge: Family;Available 24 hours/day Type of Home: House Home Access: Level entry     Home Layout: One level Home Equipment: Bedside commode;Walker - 2 wheels;Cane - single point;Tub bench;Wheelchair - manual      Prior Function Level of Independence: Needs assistance   Gait / Transfers Assistance Needed: Use of cane, RW or wc as needed depending on pain, tremors, weakness, assistance for sit to supine.  ADL's / Homemaking Assistance Needed: independent with  ADLs with set up, Assistance for transferring into shower        Hand Dominance   Dominant Hand: Right    Extremity/Trunk Assessment                Communication   Communication: No difficulties  Cognition Arousal/Alertness: Awake/alert Behavior During Therapy: WFL for tasks assessed/performed Overall Cognitive Status: Within Functional Limits for tasks assessed                                         General Comments      Exercises     Assessment/Plan    PT Assessment Patient needs continued PT services  PT Problem List Decreased strength;Decreased mobility;Decreased activity tolerance;Cardiopulmonary status limiting activity       PT Treatment Interventions DME instruction;Therapeutic exercise;Wheelchair mobility training;Functional mobility training;Therapeutic activities;Patient/family education;Balance training    PT Goals (Current goals can be found in the Care Plan section)  Acute Rehab PT Goals PT Goal Formulation: With patient Time For Goal Achievement: 07/02/20 Potential to Achieve Goals: Good Additional Goals Additional Goal #1: Propel w/c 200 feet without physical assistance    Frequency Min 3X/week   Barriers to discharge        Co-evaluation PT/OT/SLP Co-Evaluation/Treatment: Yes Reason for Co-Treatment: For patient/therapist safety;To address functional/ADL transfers;Complexity of the patient's impairments (multi-system involvement) PT goals addressed during session: Mobility/safety with mobility OT goals addressed during session: ADL's and self-care       AM-PAC PT "6 Clicks" Mobility  Outcome Measure Help needed turning from your back to your side while in a flat bed without using bedrails?: A Little Help needed moving from lying on your back to sitting on the side of a flat bed without using bedrails?: A Lot Help needed moving to and from a bed to a chair (including a wheelchair)?: A Little Help needed standing up from a chair using your arms (e.g., wheelchair or bedside chair)?: A Little Help needed to walk in hospital room?: A Lot Help needed climbing 3-5 steps with a railing? : Total 6 Click Score: 14    End of Session Equipment Utilized During Treatment: Oxygen Activity Tolerance: Patient tolerated treatment well Patient left: in bed;with call bell/phone within reach;with family/visitor present Nurse Communication: Mobility status PT Visit  Diagnosis: Other abnormalities of gait and mobility (R26.89)    Time: 3244-0102 PT Time Calculation (min) (ACUTE ONLY): 22 min   Charges:   PT Evaluation $PT Eval Low Complexity: 1 Low         Kati PT, DPT Acute Rehabilitation Services Pager: [phone] Office: [phone]  Trena Platt 06/17/2020, 3:51 PM

## 2020-06-17 NOTE — Progress Notes (Signed)
  Echocardiogram 2D Echocardiogram has been performed.  Kelly Curtis 06/17/2020, 8:54 AM

## 2020-06-17 NOTE — Progress Notes (Signed)
PROGRESS NOTE  Kelly Curtis  DOB: 05-24-1945  PCP: [name], MD OVF:[id number]  DOA: 06/15/2020  LOS: 2 days   Chief Complaint  Patient presents with  . Abdominal Pain  . Abnormal Lab   Brief narrative: Kelly Curtis is a 75 y.o. 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 Patient presented to the ED on 06/15/2020 with complaint of significant upper abdominal pain associated with poor oral intake, decreased urine output, nausea.  PCP empirically started her on ciprofloxacin for possible UTI.  A CT scan of abdomen was also performed as an outpatient which showed findings concerning for pancreatitis in the pancreatic head and hence she was referred to ED. Labs in the ED showed lipase level elevated to 3200, creatinine elevated 2.1. She was admitted to hospitalist service for further evaluation and management.  Subjective: Patient was seen and examined this morning. Propped up in bed.  Pain partially controlled.  On IV fluid at 125 mill per hour, 3 L oxygen by nasal cannula.  Remains n.p.o.  Assessment/Plan: Acute severe pancreatitis -Unclear etiology.  Very occasional use of alcohol.  Imagings negative for biliary etiology. -Lipase level improving. -Continue conservative management with IV fluid, IV antiemetics, pain control. -N.p.o. except for ice chips and meds for next 24 hours. Recent Labs  Lab 06/15/20 2041 06/16/20 0345 06/17/20 0423  LIPASE 3,205* 4,767* 1,824*   AKI -Baseline creatinine close to 1.  Creatinine was elevated 2.1 on admission secondary to pancreatitis and dehydration. -Creatinine is improving now with IV fluid.  continue to monitor Recent Labs    12/12/19 0731 12/20/19 1845 02/06/20 1444 02/09/20 1254 02/11/20 0354 06/15/20 2041 06/16/20 0345 06/17/20 0423  BUN 29* 16  --  25* 20 49* 49* 40*   CREATININE 0.95 0.88 1.00 1.35* 1.15* 2.12* 2.05* 1.21*   History of chronic diastolic CHF History of hypotension -Home meds include midodrine 10 mg 3 times daily, Lasix 20 mg twice daily as needed -We will watch for respiratory/CHF symptoms while on IV fluid  Chronic iron deficiency anemia -Hemoglobin low at 7.2 today. Partly dilutional. -Ferritin level low at 30. -Continue PPI.   Recent Labs    02/09/20 1254 02/11/20 0354 06/15/20 2041 06/16/20 0345 06/17/20 0423  HGB 11.2* 10.8* 8.8* 8.0* 7.2*  MCV 84.2 85.0 87.5 88.0 88.8  VITAMINB12  --   --   --   --  452  FOLATE  --   --   --   --  9.9  FERRITIN  --   --   --  25 30  TIBC  --   --   --  461* 413  IRON  --   --   --  29 40  RETICCTPCT  --   --   --   --  3.8*   Chronic pain secondary Erythromelalgia -Unfortunately this condition is exacerbated by heat, affects hands, feet and face. -Patient has agreed to try Voltaren ointment on feet and hands -Continue home meds: Methadone 10 mg 3 times daily, Dilaudid 2 mg every 4 hours as needed, tizanidine 4 mg 3 times daily  Anxiety/depression -Continue home regimen: Bupropion 150 mg daily, buspirone 5 mg twice daily, Remeron 7.5 mg at bedtime, Belsomra 10 mg daily at bedtime  ?  History of seizure -Continue Keppra 250 mg twice daily.  Mobility: Uses a walker at baseline Code Status:   Code  Status: DNR  Nutritional status: Body mass index is 30.16 kg/m.     Diet Order            Diet NPO time specified Except for: Sips with Meds, Ice Chips  Diet effective now                 DVT prophylaxis: enoxaparin (LOVENOX) injection 30 mg Start: 06/16/20 1000   Antimicrobials:  None Fluid: LR@125  Consultants: None Family Communication:  None  Status is: Inpatient  Remains inpatient appropriate because -acute pancreatitis    Dispo: The patient is from: Home              Anticipated d/c is to: Home              Patient currently is not medically stable to d/c.    Difficult to place patient No       Infusions:  . lactated ringers 125 mL/hr at 06/17/20 0359  . levETIRAcetam 250 mg (06/17/20 0935)  . methocarbamol (ROBAXIN) IV      Scheduled Meds: . buPROPion  150 mg Oral Daily  . busPIRone  5 mg Oral BID  . chlorhexidine  15 mL Mouth Rinse BID  . cycloSPORINE  1 drop Both Eyes BID  . diclofenac Sodium  4 g Topical BID  . enoxaparin (LOVENOX) injection  30 mg Subcutaneous Q24H  . loteprednol  1 drop Both Eyes BID  . mouth rinse  15 mL Mouth Rinse q12n4p  . melatonin  5 mg Oral QHS  . methadone  10 mg Oral Q8H  . mirtazapine  7.5 mg Oral QHS  . pantoprazole (PROTONIX) IV  40 mg Intravenous Q24H  . PARoxetine  10 mg Oral Daily  . Suvorexant  10 mg Oral QHS    Antimicrobials: Anti-infectives (From admission, onward)   Start     Dose/Rate Route Frequency Ordered Stop   06/15/20 2330  ciprofloxacin (CIPRO) tablet 250 mg  Status:  Discontinued       Note to Pharmacy: Star date : 06/12/20     250 mg Oral 2 times daily 06/15/20 2318 06/16/20 1851      PRN meds: diphenhydrAMINE, HYDROmorphone (DILAUDID) injection, methocarbamol (ROBAXIN) IV, naLOXone (NARCAN)  injection, [DISCONTINUED] ondansetron **OR** ondansetron (ZOFRAN) IV, polyethylene glycol, polyvinyl alcohol   Objective: Vitals:   06/17/20 0400 06/17/20 0800  BP: 133/65 125/69  Pulse: (!) 103 (!) 102  Resp: 16 18  Temp: 97.7 F (36.5 C)   SpO2: (!) 84% 91%    Intake/Output Summary (Last 24 hours) at 06/17/2020 1115 Last data filed at 06/17/2020 0600 Gross per 24 hour  Intake 4211.37 ml  Output 300 ml  Net 3911.37 ml   Filed Weights   06/16/20 1608 06/17/20 0500  Weight: 77.4 kg 79.7 kg   Weight change:  Body mass index is 30.16 kg/m.   Physical Exam: General exam: Pleasant elderly Caucasian female.  Partially controlled pain Skin: No rashes, lesions or ulcers. HEENT: Atraumatic, normocephalic, no obvious bleeding Lungs: Clear to auscultation bilaterally CVS:  Regular rate and rhythm, no murmur GI/Abd soft, moderate tenderness in epigastrium, nondistended, bowel sound present CNS: Alert, awake, oriented x3 Psychiatry: Mood appropriate Extremities: No pedal edema, no calf tenderness  Data Review: I have personally reviewed the laboratory data and studies available.  Recent Labs  Lab 06/15/20 2041 06/16/20 0345 06/17/20 0423  WBC 6.9 5.9 8.0  NEUTROABS 5.4  --  6.4  HGB 8.8* 8.0* 7.2*  HCT 30.0* 27.1* 24.6*  MCV 87.5 88.0 88.8  PLT 207 209 170   Recent Labs  Lab 06/15/20 2041 06/16/20 0345 06/17/20 0423  NA 141 141 144  K 4.0 4.3 4.7  CL 102 106 111  CO2 27 25 26   GLUCOSE 91 77 67*  BUN 49* 49* 40*  CREATININE 2.12* 2.05* 1.21*  CALCIUM 9.0 8.4* 8.8*  MG  --  1.9 2.0  PHOS  --  4.8* 3.6    F/u labs ordered Unresulted Labs (From admission, onward)          Start     Ordered   06/17/20 0500  Comprehensive metabolic panel  Daily,   R      06/16/20 0758   06/17/20 0500  Magnesium  Daily,   R      06/16/20 0758   06/17/20 0500  Phosphorus  Daily,   R      06/16/20 0758   06/17/20 0500  CBC with Differential/Platelet  Daily,   R      06/16/20 0758   06/16/20 0759  Lipase, blood  Daily,   R      06/16/20 0758          Signed, Terrilee Croak, MD [facility] 06/17/2020

## 2020-06-17 NOTE — Plan of Care (Signed)
  Problem: Clinical Measurements: Goal: Will remain free from infection Outcome: Progressing Goal: Respiratory complications will improve Outcome: Progressing Goal: Cardiovascular complication will be avoided Outcome: Progressing   Problem: Activity: Goal: Risk for activity intolerance will decrease Outcome: Progressing   Problem: Pain Managment: Goal: General experience of comfort will improve Outcome: Progressing   Problem: Safety: Goal: Ability to remain free from injury will improve Outcome: Progressing   Problem: Skin Integrity: Goal: Risk for impaired skin integrity will decrease Outcome: Progressing

## 2020-06-18 LAB — CBC WITH DIFFERENTIAL/PLATELET
Abs Immature Granulocytes: 0.05 10*3/uL (ref 0.00–0.07)
Basophils Absolute: 0 10*3/uL (ref 0.0–0.1)
Basophils Relative: 0 %
Eosinophils Absolute: 0.1 10*3/uL (ref 0.0–0.5)
Eosinophils Relative: 1 %
HCT: 25.1 % — ABNORMAL LOW (ref 36.0–46.0)
Hemoglobin: 7.3 g/dL — ABNORMAL LOW (ref 12.0–15.0)
Immature Granulocytes: 0 %
Lymphocytes Relative: 6 %
Lymphs Abs: 0.7 10*3/uL (ref 0.7–4.0)
MCH: 25.9 pg — ABNORMAL LOW (ref 26.0–34.0)
MCHC: 29.1 g/dL — ABNORMAL LOW (ref 30.0–36.0)
MCV: 89 fL (ref 80.0–100.0)
Monocytes Absolute: 1.3 10*3/uL — ABNORMAL HIGH (ref 0.1–1.0)
Monocytes Relative: 11 %
Neutro Abs: 10.5 10*3/uL — ABNORMAL HIGH (ref 1.7–7.7)
Neutrophils Relative %: 82 %
Platelets: 196 10*3/uL (ref 150–400)
RBC: 2.82 MIL/uL — ABNORMAL LOW (ref 3.87–5.11)
RDW: 20.8 % — ABNORMAL HIGH (ref 11.5–15.5)
WBC: 12.7 10*3/uL — ABNORMAL HIGH (ref 4.0–10.5)
nRBC: 0.2 % (ref 0.0–0.2)

## 2020-06-18 LAB — COMPREHENSIVE METABOLIC PANEL
ALT: 33 U/L (ref 0–44)
AST: 59 U/L — ABNORMAL HIGH (ref 15–41)
Albumin: 3 g/dL — ABNORMAL LOW (ref 3.5–5.0)
Alkaline Phosphatase: 239 U/L — ABNORMAL HIGH (ref 38–126)
Anion gap: 11 (ref 5–15)
BUN: 29 mg/dL — ABNORMAL HIGH (ref 8–23)
CO2: 23 mmol/L (ref 22–32)
Calcium: 9 mg/dL (ref 8.9–10.3)
Chloride: 110 mmol/L (ref 98–111)
Creatinine, Ser: 1.35 mg/dL — ABNORMAL HIGH (ref 0.44–1.00)
GFR, Estimated: 42 mL/min — ABNORMAL LOW (ref >60)
Glucose, Bld: 56 mg/dL — ABNORMAL LOW (ref 70–99)
Potassium: 4.8 mmol/L (ref 3.5–5.1)
Sodium: 144 mmol/L (ref 135–145)
Total Bilirubin: 1.9 mg/dL — ABNORMAL HIGH (ref 0.3–1.2)
Total Protein: 5.7 g/dL — ABNORMAL LOW (ref 6.5–8.1)

## 2020-06-18 LAB — LIPASE, BLOOD: Lipase: 206 U/L — ABNORMAL HIGH (ref 11–51)

## 2020-06-18 LAB — PHOSPHORUS: Phosphorus: 2.5 mg/dL (ref 2.5–4.6)

## 2020-06-18 LAB — MAGNESIUM: Magnesium: 2 mg/dL (ref 1.7–2.4)

## 2020-06-18 MED ORDER — DIPHENHYDRAMINE HCL 50 MG PO CAPS
50.0000 mg | ORAL_CAPSULE | Freq: Once | ORAL | Status: AC
  Administered 2020-06-18: 50 mg via ORAL
  Filled 2020-06-18: qty 1

## 2020-06-18 MED ORDER — HALOPERIDOL LACTATE 5 MG/ML IJ SOLN
2.0000 mg | Freq: Once | INTRAMUSCULAR | Status: AC
  Administered 2020-06-19: 2 mg via INTRAVENOUS
  Filled 2020-06-18: qty 1

## 2020-06-18 NOTE — Care Management Important Message (Signed)
Important Message  Patient Details IM Letter placed in Patient's room Name: Kelly Curtis MRN: [id number] Date of Birth: 12-19-1945   Medicare Important Message Given:  Yes     Kerin Salen 06/18/2020, 11:48 AM

## 2020-06-18 NOTE — Progress Notes (Signed)
Bed alarm heard going off. Bright red blood noted to floor and left lower arm. No leads in place, no oxygen in nose. Encouraged patient to "sit back down, and slide up in the bed." Provided with new gown, sheets changed, chuck pad changed, given bath. Hooked back up to telemetry. Placed back on oxygen. Sister in to see patient. No other needs identified. Will continue to monitor.

## 2020-06-18 NOTE — Plan of Care (Signed)
Alert to self, educated on not getting out of bed without assistance due to being a fall risk. Patient on tele, and continuous pulse ox. Sinus tach noted on the monitor. On 4L of nasal cannula at this time. No other needs identified. Will continue to monitor.  Problem: Education: Goal: Knowledge of General Education information will improve Description: Including pain rating scale, medication(s)/side effects and non-pharmacologic comfort measures Outcome: Progressing   Problem: Health Behavior/Discharge Planning: Goal: Ability to manage health-related needs will improve Outcome: Progressing   Problem: Clinical Measurements: Goal: Ability to maintain clinical measurements within normal limits will improve Outcome: Progressing Goal: Will remain free from infection Outcome: Progressing Goal: Diagnostic test results will improve Outcome: Progressing Goal: Respiratory complications will improve Outcome: Progressing Goal: Cardiovascular complication will be avoided Outcome: Progressing

## 2020-06-18 NOTE — Progress Notes (Addendum)
PROGRESS NOTE  Kelly Curtis  DOB: [birth year]  PCP: [name], MD SAY:[id number]  DOA: 06/15/2020  LOS: 3 days   Chief Complaint  Patient presents with  . Abdominal Pain  . Abnormal Lab   Brief narrative: Kelly Curtis is a 75 y.o. 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 Patient presented to the ED on 06/15/2020 with complaint of significant upper abdominal pain associated with poor oral intake, decreased urine output, nausea.  PCP empirically started her on ciprofloxacin for possible UTI.  A CT scan of abdomen was also performed as an outpatient which showed findings concerning for pancreatitis in the pancreatic head and hence she was referred to ED. Labs in the ED showed lipase level elevated to 3200, creatinine elevated 2.1. She was admitted to hospitalist service for further evaluation and management.  Subjective: Patient was seen and examined this morning. Sitting up in bed.  Not in distress.  Not on supplemental oxygen this morning.  Getting IV fluids at 125 mL/h.  Feels improving abdominal pain.  Lipase level significantly better this morning.   Patient is upset however that her sister Kelly Curtis came to visit her from [place] yesterday is not answering her phone calls.  Assessment/Plan: Acute severe pancreatitis -Unclear etiology.  Very occasional use of alcohol.  Imagings negative for biliary etiology. -Lipase level improving with conservative management -Reduce LR rate to 50 mill per hour.  Continue antiemetics, pain control.  Start on clear liquid diet today.  Continue to monitor symptoms. Recent Labs  Lab 06/15/20 2041 06/16/20 0345 06/17/20 0423 06/18/20 0352  LIPASE 3,205* 4,767* 1,824* 206*   AKI on CKD 2 -Baseline creatinine close to 1.  Creatinine was elevated 2.1 on admission secondary to pancreatitis and  dehydration. -Creatinine is improving now with IV fluid.  continue to monitor Recent Labs    12/12/19 0731 12/20/19 1845 02/06/20 1444 02/09/20 1254 02/11/20 0354 06/15/20 2041 06/16/20 0345 06/17/20 0423 06/18/20 0352  BUN 29* 16  --  25* 20 49* 49* 40* 29*  CREATININE 0.95 0.88 1.00 1.35* 1.15* 2.12* 2.05* 1.21* 1.35*   History of chronic diastolic CHF History of hypotension -Home meds include midodrine 10 mg 3 times daily, Lasix 20 mg twice daily as needed -Currently blood pressure stable on IV fluid.  Continue to monitor for respiratory/CHF symptoms while on IV fluid  Sinus tachycardia -Probably related to low arterial volume related to pancreatitis.  Heart rate mostly over 100.  Continue to monitor.  Was not using any AV nodal blocking agent at home.  Chronic iron deficiency anemia -Hemoglobin low at 7.3 today. Partly dilutional. -Ferritin level low at 30. -Continue PPI.   Recent Labs    02/11/20 0354 06/15/20 2041 06/16/20 0345 06/17/20 0423 06/18/20 0352  HGB 10.8* 8.8* 8.0* 7.2* 7.3*  MCV 85.0 87.5 88.0 88.8 89.0  VITAMINB12  --   --   --  452  --   FOLATE  --   --   --  9.9  --   FERRITIN  --   --  25 30  --   TIBC  --   --  461* 413  --   IRON  --   --  29 40  --   RETICCTPCT  --   --   --  3.8*  --    Chronic pain secondary Erythromelalgia -Unfortunately this condition is exacerbated by  heat, affects hands, feet and face. -Patient has agreed to try Voltaren ointment on feet and hands -Continue home meds: Methadone 10 mg 3 times daily, Dilaudid 2 mg every 4 hours as needed, tizanidine 4 mg 3 times daily  Anxiety/depression -Continue home regimen: Bupropion 150 mg daily, buspirone 5 mg twice daily, Remeron 7.5 mg at bedtime, Belsomra 10 mg daily at bedtime  ?  History of seizure -Continue Keppra 250 mg twice daily.  Mobility: Uses a walker at baseline Code Status:   Code Status: DNR  Nutritional status: Body mass index is 29.93 kg/m.     Diet  Order            Diet clear liquid Room service appropriate? Yes; Fluid consistency: Thin  Diet effective now                 DVT prophylaxis: enoxaparin (LOVENOX) injection 40 mg Start: 06/18/20 1000   Antimicrobials:  None Fluid: LR@50  Consultants: None Family Communication:  None  Status is: Inpatient  Remains inpatient appropriate because -acute pancreatitis, needs IV fluid and further inpatient monitoring    Dispo: The patient is from: Home              Anticipated d/c is to: Home in 2 to 3 days              Patient currently is not medically stable to d/c.   Difficult to place patient No       Infusions:  . lactated ringers 50 mL/hr at 06/18/20 1156  . levETIRAcetam 250 mg (06/18/20 1017)  . methocarbamol (ROBAXIN) IV      Scheduled Meds: . buPROPion  150 mg Oral Daily  . busPIRone  5 mg Oral BID  . chlorhexidine  15 mL Mouth Rinse BID  . cycloSPORINE  1 drop Both Eyes BID  . diclofenac Sodium  4 g Topical BID  . enoxaparin (LOVENOX) injection  40 mg Subcutaneous Q24H  . loteprednol  1 drop Both Eyes BID  . mouth rinse  15 mL Mouth Rinse q12n4p  . melatonin  5 mg Oral QHS  . methadone  10 mg Oral Q8H  . mirtazapine  7.5 mg Oral QHS  . pantoprazole (PROTONIX) IV  40 mg Intravenous Q24H  . PARoxetine  10 mg Oral Daily  . Suvorexant  10 mg Oral QHS    Antimicrobials: Anti-infectives (From admission, onward)   Start     Dose/Rate Route Frequency Ordered Stop   06/15/20 2330  ciprofloxacin (CIPRO) tablet 250 mg  Status:  Discontinued       Note to Pharmacy: Star date : 06/12/20     250 mg Oral 2 times daily 06/15/20 2318 06/16/20 1851      PRN meds: diphenhydrAMINE, HYDROmorphone (DILAUDID) injection, methocarbamol (ROBAXIN) IV, naLOXone (NARCAN)  injection, [DISCONTINUED] ondansetron **OR** ondansetron (ZOFRAN) IV, polyethylene glycol, polyvinyl alcohol   Objective: Vitals:   06/18/20 1156 06/18/20 1200  BP: 124/79 124/79  Pulse: (!) 111    Resp: 18   Temp: 97.7 F (36.5 C)   SpO2: 100%     Intake/Output Summary (Last 24 hours) at 06/18/2020 1249 Last data filed at 06/18/2020 0340 Gross per 24 hour  Intake 1920.17 ml  Output --  Net 1920.17 ml   Filed Weights   06/16/20 1608 06/17/20 0500 06/18/20 0500  Weight: 77.4 kg 79.7 kg 79.1 kg   Weight change: 1.7 kg Body mass index is 29.93 kg/m.   Physical Exam: General exam: Pleasant  elderly Caucasian female.  Pain controlled. Skin: No rashes, lesions or ulcers. HEENT: Atraumatic, normocephalic, no obvious bleeding Lungs: Clear to auscultation bilaterally CVS: Regular rate and rhythm, no murmur GI/Abd soft, mild tenderness in epigastrium, nondistended, bowel sound present CNS: Alert, awake, oriented x3 Psychiatry: Upset Extremities: No pedal edema, no calf tenderness  Data Review: I have personally reviewed the laboratory data and studies available.  Recent Labs  Lab 06/15/20 2041 06/16/20 0345 06/17/20 0423 06/18/20 0352  WBC 6.9 5.9 8.0 12.7*  NEUTROABS 5.4  --  6.4 10.5*  HGB 8.8* 8.0* 7.2* 7.3*  HCT 30.0* 27.1* 24.6* 25.1*  MCV 87.5 88.0 88.8 89.0  PLT 207 209 170 196   Recent Labs  Lab 06/15/20 2041 06/16/20 0345 06/17/20 0423 06/18/20 0352  NA 141 141 144 144  K 4.0 4.3 4.7 4.8  CL 102 106 111 110  CO2 27 25 26 23   GLUCOSE 91 77 67* 56*  BUN 49* 49* 40* 29*  CREATININE 2.12* 2.05* 1.21* 1.35*  CALCIUM 9.0 8.4* 8.8* 9.0  MG  --  1.9 2.0 2.0  PHOS  --  4.8* 3.6 2.5    F/u labs ordered Unresulted Labs (From admission, onward)          Start     Ordered   06/17/20 0500  Comprehensive metabolic panel  Daily,   R      06/16/20 0758   06/17/20 0500  Magnesium  Daily,   R      06/16/20 0758   06/17/20 0500  Phosphorus  Daily,   R      06/16/20 0758   06/17/20 0500  CBC with Differential/Platelet  Daily,   R      06/16/20 0758   06/16/20 0759  Lipase, blood  Daily,   R      06/16/20 0758          Signed, Terrilee Croak,  MD [facility] 06/18/2020

## 2020-06-18 NOTE — Progress Notes (Signed)
Shift Summary:   Remained confused the majority of the shift with multiple attempts at getting OOB, alert to self. Not easily reoriented. Remained on 4L of nasal cannula throughout this shift. HR remained tachy 110-130's. Remains on LR at 50 ml/hr. Diet upgraded to clear liquid diet, Sister visited during this shift. No other needs identified .Will continue to monitor.   POC: Possible d/c tomorrow, pain control, monitor labs.

## 2020-06-19 LAB — CBC WITH DIFFERENTIAL/PLATELET
Abs Immature Granulocytes: 0.09 10*3/uL — ABNORMAL HIGH (ref 0.00–0.07)
Basophils Absolute: 0 10*3/uL (ref 0.0–0.1)
Basophils Relative: 0 %
Eosinophils Absolute: 0.3 10*3/uL (ref 0.0–0.5)
Eosinophils Relative: 2 %
HCT: 23.6 % — ABNORMAL LOW (ref 36.0–46.0)
Hemoglobin: 7.1 g/dL — ABNORMAL LOW (ref 12.0–15.0)
Immature Granulocytes: 1 %
Lymphocytes Relative: 6 %
Lymphs Abs: 0.8 10*3/uL (ref 0.7–4.0)
MCH: 26.3 pg (ref 26.0–34.0)
MCHC: 30.1 g/dL (ref 30.0–36.0)
MCV: 87.4 fL (ref 80.0–100.0)
Monocytes Absolute: 1.3 10*3/uL — ABNORMAL HIGH (ref 0.1–1.0)
Monocytes Relative: 10 %
Neutro Abs: 9.8 10*3/uL — ABNORMAL HIGH (ref 1.7–7.7)
Neutrophils Relative %: 81 %
Platelets: 234 10*3/uL (ref 150–400)
RBC: 2.7 MIL/uL — ABNORMAL LOW (ref 3.87–5.11)
RDW: 20.9 % — ABNORMAL HIGH (ref 11.5–15.5)
WBC: 12.3 10*3/uL — ABNORMAL HIGH (ref 4.0–10.5)
nRBC: 0.2 % (ref 0.0–0.2)

## 2020-06-19 LAB — COMPREHENSIVE METABOLIC PANEL
ALT: 27 U/L (ref 0–44)
AST: 38 U/L (ref 15–41)
Albumin: 2.8 g/dL — ABNORMAL LOW (ref 3.5–5.0)
Alkaline Phosphatase: 222 U/L — ABNORMAL HIGH (ref 38–126)
Anion gap: 7 (ref 5–15)
BUN: 26 mg/dL — ABNORMAL HIGH (ref 8–23)
CO2: 23 mmol/L (ref 22–32)
Calcium: 8.9 mg/dL (ref 8.9–10.3)
Chloride: 109 mmol/L (ref 98–111)
Creatinine, Ser: 1.21 mg/dL — ABNORMAL HIGH (ref 0.44–1.00)
GFR, Estimated: 48 mL/min — ABNORMAL LOW (ref >60)
Glucose, Bld: 107 mg/dL — ABNORMAL HIGH (ref 70–99)
Potassium: 3.9 mmol/L (ref 3.5–5.1)
Sodium: 139 mmol/L (ref 135–145)
Total Bilirubin: 1.6 mg/dL — ABNORMAL HIGH (ref 0.3–1.2)
Total Protein: 5.5 g/dL — ABNORMAL LOW (ref 6.5–8.1)

## 2020-06-19 LAB — PHOSPHORUS: Phosphorus: 2.1 mg/dL — ABNORMAL LOW (ref 2.5–4.6)

## 2020-06-19 LAB — GLUCOSE, CAPILLARY
Glucose-Capillary: 64 mg/dL — ABNORMAL LOW (ref 70–99)
Glucose-Capillary: 75 mg/dL (ref 70–99)
Glucose-Capillary: 100 mg/dL — ABNORMAL HIGH (ref 70–99)
Glucose-Capillary: 82 mg/dL (ref 70–99)
Glucose-Capillary: 85 mg/dL (ref 70–99)

## 2020-06-19 LAB — AMMONIA: Ammonia: 20 umol/L (ref 9–35)

## 2020-06-19 LAB — MAGNESIUM: Magnesium: 2 mg/dL (ref 1.7–2.4)

## 2020-06-19 LAB — LIPASE, BLOOD: Lipase: 109 U/L — ABNORMAL HIGH (ref 11–51)

## 2020-06-19 MED ORDER — QUETIAPINE FUMARATE 25 MG PO TABS
25.0000 mg | ORAL_TABLET | Freq: Two times a day (BID) | ORAL | Status: DC
  Administered 2020-06-19 – 2020-06-25 (×11): 25 mg via ORAL
  Filled 2020-06-19 (×11): qty 1

## 2020-06-19 MED ORDER — FLUTICASONE PROPIONATE 50 MCG/ACT NA SUSP
1.0000 | Freq: Every day | NASAL | Status: DC | PRN
  Administered 2020-06-20: 1 via NASAL
  Filled 2020-06-19: qty 16

## 2020-06-19 MED ORDER — ONDANSETRON HCL 4 MG PO TABS
4.0000 mg | ORAL_TABLET | Freq: Four times a day (QID) | ORAL | Status: DC | PRN
  Administered 2020-06-23 – 2020-06-24 (×2): 4 mg via ORAL
  Filled 2020-06-19 (×2): qty 1

## 2020-06-19 MED ORDER — LORAZEPAM 2 MG/ML IJ SOLN
1.0000 mg | Freq: Four times a day (QID) | INTRAMUSCULAR | Status: DC | PRN
  Administered 2020-06-19 – 2020-06-24 (×6): 1 mg via INTRAVENOUS
  Filled 2020-06-19 (×6): qty 1

## 2020-06-19 MED ORDER — LEVETIRACETAM 250 MG PO TABS
250.0000 mg | ORAL_TABLET | Freq: Two times a day (BID) | ORAL | Status: DC
  Administered 2020-06-20 – 2020-06-25 (×9): 250 mg via ORAL
  Filled 2020-06-19 (×12): qty 1

## 2020-06-19 MED ORDER — K PHOS MONO-SOD PHOS DI & MONO 155-852-130 MG PO TABS
500.0000 mg | ORAL_TABLET | Freq: Two times a day (BID) | ORAL | Status: AC
  Administered 2020-06-19 – 2020-06-20 (×2): 500 mg via ORAL
  Filled 2020-06-19 (×4): qty 2

## 2020-06-19 MED ORDER — DIPHENHYDRAMINE HCL 25 MG PO CAPS: 25.0000 mg | ORAL_CAPSULE | Freq: Three times a day (TID) | ORAL | Status: DC | PRN

## 2020-06-19 MED ORDER — HYDROMORPHONE HCL 2 MG PO TABS
2.0000 mg | ORAL_TABLET | Freq: Four times a day (QID) | ORAL | Status: DC
Start: 2020-06-19 — End: 2020-06-20
  Administered 2020-06-19: 2 mg via ORAL
  Filled 2020-06-19: qty 1

## 2020-06-19 MED ORDER — PANTOPRAZOLE SODIUM 40 MG PO TBEC
40.0000 mg | DELAYED_RELEASE_TABLET | Freq: Every day | ORAL | Status: DC
  Administered 2020-06-20 – 2020-06-24 (×5): 40 mg via ORAL
  Filled 2020-06-19 (×5): qty 1

## 2020-06-19 MED ORDER — SODIUM CHLORIDE 0.9 % IV SOLN
510.0000 mg | Freq: Once | INTRAVENOUS | Status: DC
  Filled 2020-06-19: qty 17

## 2020-06-19 MED ORDER — METHOCARBAMOL 500 MG PO TABS: 500.0000 mg | ORAL_TABLET | Freq: Three times a day (TID) | ORAL | Status: DC | PRN

## 2020-06-19 NOTE — Progress Notes (Signed)
PROGRESS NOTE  Kelly Curtis  DOB: 1945-03-31  PCP: [name], MD WCH:[id number]  DOA: 06/15/2020  LOS: 4 days   Chief Complaint  Patient presents with  . Abdominal Pain  . Abnormal Lab   Brief narrative: Kelly Curtis is a 75 y.o. 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 Patient presented to the ED on 06/15/2020 with complaint of significant upper abdominal pain associated with poor oral intake, decreased urine output, nausea.  PCP empirically started her on ciprofloxacin for possible UTI.  A CT scan of abdomen was also performed as an outpatient which showed findings concerning for pancreatitis in the pancreatic head and hence she was referred to ED. Labs in the ED showed lipase level elevated to 3200, creatinine elevated 2.1. She was admitted to hospitalist service for further evaluation and management. See below for details  Subjective: Patient was seen and examined this morning.  Propped up in bed.  Not in distress.  Pain is improved.  Tolerated clear liquid diet. Patient however has been very confused last 24 hours.  She required to be restrained last night. She remains tachycardic as well. I spoke to patient's sister at bedside this morning.  Assessment/Plan: Acute severe pancreatitis -Unclear etiology.  No alcohol in last several years per patient and family.  Imagings negative for biliary etiology. -Lipase level improving with conservative management -Continue LR rate to 50 mill per hour.  Continue antiemetics, pain control.  Advance to full liquid diet today.  -Continue to monitor symptoms. Recent Labs  Lab 06/15/20 2041 06/16/20 0345 06/17/20 0423 06/18/20 0352 06/19/20 0408  LIPASE 3,205* 4,767* 1,824* 206* 353*   Acute metabolic encephalopathy -Last 48 hours, patient's mental status has been very erratic.  She  is delirious frequently, has hallucinations, delusions.  Family is very concerned about worsening mental status.  She has underlying mild memory issues.  She is also on long-term use of opioids. -Currently no evidence of infection.  I think her acute encephalopathy is related to metabolic abnormalities related to pancreatitis, no oral intake, electrolyte abnormalities, lack of sleep and hospital stay. -I had a long conversation with patient's sister at bedside about this. -We will resume patient's home medications including Belsomra.  Also added Seroquel 25 mg twice daily.  Continue reorientation effort.  Continue restraints as needed. -Obtain urinalysis  Sinus tachycardia -Probably related to agitation.  Heart rate mostly over 100.  Continue to monitor.  Was not using any AV nodal blocking agent at home. -Echocardiogram with EF 60 to 65%, mild LVH.  AKI on CKD 2 -Baseline creatinine close to 1. Creatinine was elevated 2.12 on admission secondary to pancreatitis and dehydration. -Creatinine is improving now with IV fluid.  continue to monitor Recent Labs    12/12/19 0731 12/20/19 1845 02/06/20 1444 02/09/20 1254 02/11/20 0354 06/15/20 2041 06/16/20 0345 06/17/20 0423 06/18/20 0352 06/19/20 0408  BUN 29* 16  --  25* 20 49* 49* 40* 29* 26*  CREATININE 0.95 0.88 1.00 1.35* 1.15* 2.12* 2.05* 1.21* 1.35* 1.21*   History of chronic diastolic CHF History of hypotension -Home meds include midodrine 10 mg 3 times daily, Lasix 20 mg twice daily as needed -Currently blood pressure stable on IV fluid.  Continue to monitor for respiratory/CHF symptoms while on IV fluid  Acute anemia  chronic iron deficiency anemia -Hemoglobin at baseline over 10 until November 2021.  No active bleeding  but in this hospitalization, her hemoglobin has been running low mostly between 7 and 8.  Partly dilutional because of aggressive hydration. Ferritin level is low as well.  Unable to tolerate oral iron  supplement.  Will give 1 dose of Feraheme while in the hospital. -Obtain FOBT. -Continue PPI.   Recent Labs    06/15/20 2041 06/16/20 0345 06/17/20 0423 06/18/20 0352 06/19/20 0408  HGB 8.8* 8.0* 7.2* 7.3* 7.1*  MCV 87.5 88.0 88.8 89.0 87.4  VITAMINB12  --   --  452  --   --   FOLATE  --   --  9.9  --   --   FERRITIN  --  25 30  --   --   TIBC  --  461* 413  --   --   IRON  --  29 40  --   --   RETICCTPCT  --   --  3.8*  --   --    Chronic pain secondary Erythromelalgia -Unfortunately this condition is exacerbated by heat, affects hands, feet and face. -Continue home meds: Methadone 10 mg 3 times daily, Dilaudid 2 mg every 4 hours as needed, tizanidine 4 mg 3 times daily and Keppra 250 mg twice daily.  Sister denies any history of seizure.  Anxiety/depression -Continue home regimen: Bupropion 150 mg daily, buspirone 5 mg twice daily, Remeron 7.5 mg at bedtime, Belsomra 10 mg daily at bedtime  Mobility: Uses a walker at baseline. Code Status:   Code Status: DNR  Nutritional status: Body mass index is 29.93 kg/m.     Diet Order            Diet full liquid Room service appropriate? Yes; Fluid consistency: Thin  Diet effective now                 DVT prophylaxis: enoxaparin (LOVENOX) injection 40 mg Start: 06/18/20 1000   Antimicrobials:  None Fluid: LR@50  Consultants: None Family Communication:  None  Status is: Inpatient  Remains inpatient appropriate because - acute pancreatitis, needs IV fluid and further inpatient monitoring, acute encephalopathy.   Dispo: The patient is from: Home              Anticipated d/c is to: Home versus rehab in next 2 to 3 days.  Family prefers to rehab.              Patient currently is not medically stable to d/c.   Difficult to place patient No       Infusions:  . ferumoxytol Stopped (06/19/20 1333)  . lactated ringers 50 mL/hr at 06/18/20 1718    Scheduled Meds: . buPROPion  150 mg Oral Daily  . busPIRone  5 mg  Oral BID  . chlorhexidine  15 mL Mouth Rinse BID  . cycloSPORINE  1 drop Both Eyes BID  . diclofenac Sodium  4 g Topical BID  . enoxaparin (LOVENOX) injection  40 mg Subcutaneous Q24H  . HYDROmorphone  2 mg Oral Q6H  . levETIRAcetam  250 mg Oral BID  . loteprednol  1 drop Both Eyes BID  . mouth rinse  15 mL Mouth Rinse q12n4p  . melatonin  5 mg Oral QHS  . methadone  10 mg Oral Q8H  . mirtazapine  7.5 mg Oral QHS  . pantoprazole  40 mg Oral QHS  . PARoxetine  10 mg Oral Daily  . phosphorus  500 mg Oral BID  . QUEtiapine  25 mg Oral BID  . Suvorexant  10 mg Oral QHS    Antimicrobials: Anti-infectives (From admission, onward)   Start     Dose/Rate Route Frequency Ordered Stop   06/15/20 2330  ciprofloxacin (CIPRO) tablet 250 mg  Status:  Discontinued       Note to Pharmacy: Star date : 06/12/20     250 mg Oral 2 times daily 06/15/20 2318 06/16/20 1851      PRN meds: diphenhydrAMINE, fluticasone, methocarbamol, naLOXone (NARCAN)  injection, ondansetron, polyethylene glycol, polyvinyl alcohol   Objective: Vitals:   06/19/20 1011 06/19/20 1326  BP: 102/61 106/60  Pulse: (!) 125 (!) 118  Resp: 18 18  Temp: (!) 97.5 F (36.4 C) 97.7 F (36.5 C)  SpO2: 94% 95%    Intake/Output Summary (Last 24 hours) at 06/19/2020 1437 Last data filed at 06/19/2020 1307 Gross per 24 hour  Intake 320 ml  Output 0 ml  Net 320 ml   Filed Weights   06/16/20 1608 06/17/20 0500 06/18/20 0500  Weight: 77.4 kg 79.7 kg 79.1 kg   Weight change:  Body mass index is 29.93 kg/m.   Physical Exam: General exam: Pleasant elderly Caucasian female.  Pain controlled. Skin: No rashes, lesions or ulcers. HEENT: Atraumatic, normocephalic, no obvious bleeding Lungs: Clear to auscultation bilaterally CVS: Regular rate and rhythm, no murmur GI/Abd soft, improving tenderness in epigastrium, nondistended, bowel sound present CNS: Alert, awake, slow to respond, not oriented.  Episodes of restlessness,  agitation. Psychiatry: Depressed look Extremities: No pedal edema, no calf tenderness  Data Review: I have personally reviewed the laboratory data and studies available.  Recent Labs  Lab 06/15/20 2041 06/16/20 0345 06/17/20 0423 06/18/20 0352 06/19/20 0408  WBC 6.9 5.9 8.0 12.7* 12.3*  NEUTROABS 5.4  --  6.4 10.5* 9.8*  HGB 8.8* 8.0* 7.2* 7.3* 7.1*  HCT 30.0* 27.1* 24.6* 25.1* 23.6*  MCV 87.5 88.0 88.8 89.0 87.4  PLT 207 209 170 196 234   Recent Labs  Lab 06/15/20 2041 06/16/20 0345 06/17/20 0423 06/18/20 0352 06/19/20 0408  NA 141 141 144 144 139  K 4.0 4.3 4.7 4.8 3.9  CL 102 106 111 110 109  CO2 27 25 26 23 23   GLUCOSE 91 77 67* 56* 107*  BUN 49* 49* 40* 29* 26*  CREATININE 2.12* 2.05* 1.21* 1.35* 1.21*  CALCIUM 9.0 8.4* 8.8* 9.0 8.9  MG  --  1.9 2.0 2.0 2.0  PHOS  --  4.8* 3.6 2.5 2.1*    F/u labs ordered Unresulted Labs (From admission, onward)          Start     Ordered   06/19/20 1430  Urinalysis, Routine w reflex microscopic  Once,   R        06/19/20 1429   06/17/20 0500  Magnesium  Daily,   R      06/16/20 0758   06/17/20 0500  Phosphorus  Daily,   R      06/16/20 0758   06/17/20 0500  CBC with Differential/Platelet  Daily,   R      06/16/20 0758   06/16/20 0759  Lipase, blood  Daily,   R      06/16/20 0758   Unscheduled  Occult blood card to lab, stool  As needed,   R      06/19/20 1432          Signed, Terrilee Croak, MD [facility] 06/19/2020

## 2020-06-19 NOTE — Progress Notes (Signed)
Sister inquired about patient last dose of Dilaudid. Explained patient last dose of dilaudid was yesterday at 42. Sister expressed, "patient should have never been taken off of her dilaudid." Sister expressed, " patient is on dilaudid q 6 hours at home." Sister also expressed "I would like the [place] discontinued and would like to speak with the doctor."  Made MD Terrilee Croak aware via secure method.

## 2020-06-19 NOTE — Progress Notes (Signed)
In bilateral wrist restraints. Observer noted at bedside. Alert to place, self. Denying any pain, RR even and unlabored on 4L of nasal cannula. On tele noted to be sinus tach, and continuous pulse ox.  Bed low and locked. Will continue to monitor.

## 2020-06-19 NOTE — Progress Notes (Signed)
OT Cancellation Note  Patient Details Name: Kelly Curtis MRN: [id number] DOB: Sep 22, 1945   Cancelled Treatment:    Reason Eval/Treat Not Completed: Other (comment) Hold due to possible withdrawal and combative behavior. Will continue to follow.  [name] [name] 06/19/2020, 1:07 PM

## 2020-06-19 NOTE — Plan of Care (Signed)

## 2020-06-19 NOTE — Progress Notes (Addendum)
   06/19/20 1553  Assess: MEWS Score  BP 113/78  Pulse Rate (!) 120  Resp 20  Level of Consciousness Alert  SpO2 93 %  O2 Device Nasal Cannula  O2 Flow Rate (L/min) 4 L/min  Assess: MEWS Score  MEWS Temp 0  MEWS Systolic 0  MEWS Pulse 2  MEWS RR 0  MEWS LOC 0  MEWS Score 2  MEWS Score Color Yellow  Assess: if the MEWS score is Yellow or Red  Were vital signs taken at a resting state? Yes  Focused Assessment No change from prior assessment  Early Detection of Sepsis Score *See Row Information* Low  MEWS guidelines implemented *See Row Information* No, previously yellow, continue vital signs every 4 hours  Treat  Pain Scale 0-10  Pain Score 0  Called to room, due to patient "mumbling."  When arrived to the room, patient is resting in the bed, alert, but still hallucinating, RR even and unlabored, on 4L of nasal cannula, HR tachy in the 120s, with feet moving up and down on the bed,  Adjusted restraints, slid patient up in the bed with care partner, lights decreased to promote relaxation, tv turned off, sister remains at bedside, no other needs identified. Will continue to monitor.

## 2020-06-19 NOTE — Progress Notes (Addendum)
Shift Summary:    Calm for half of the shift, ladder part remained agitated, with hand tremor. Given 1-MG of Ativan per MAR. Patient is currently resting with eyes closed, RR even and unlabored on 4-L of nasal cannula. Remains in bilateral wrist restraints. Remained in a yellow mews. MD Dahal aware. Refused LR and Ferumoxytol infusion. Refused urinalysis. No other needs identified. Will continue monitor.

## 2020-06-19 NOTE — Progress Notes (Signed)
Appears to be agitated after receiving dilaudid PO, refuses to keep on oxygen. Attempting to get OOB, while in bilateral wrist restraints. Patient expresses, "move you bitch." Patient also endorses, "you are so dumb what is your IQ?" Explained that type of language is not appropriate. Made MD Dahal aware.

## 2020-06-19 NOTE — Progress Notes (Signed)
Patient is getting out of chair trying to the room, she will not keep her telemetry she states it hurts her skin, Pulse Ox is off, or let me administered any IV medication due to the noise from the IV pump. Patient has severe tremors and unsteady on her feet, she hallucinating, talking to imaginary people in her room, taking her clothes off; HR is between 120-131; and her 02 sats 82-87; she suppose to be on 4l of oxygen she will not keep that in her nose. It looks like withdrawal symptoms after reading the MD notes.. I think she needs a safety sitter may need to be assessed if she needs to be on CIWA precaution. Provider notified see MAR.

## 2020-06-19 NOTE — Progress Notes (Addendum)
PT Cancellation Note  Patient Details Name: Kelly Curtis MRN: [id number] DOB: 1945/06/17   Cancelled Treatment:    Reason Eval/Treat Not Completed: Patient's level of consciousness;Medical issues which prohibited therapy,AM events noted. Will follow when pt. Able to participate. Patient able to participate 3/23 with PT.   [name] 06/19/2020, 7:55 AM [place] Pager [phone] Office [phone]

## 2020-06-19 NOTE — Progress Notes (Signed)
Rapid Response Event Note   Reason for Call :  Possibly withdrawing from something - increasingly AMS  Initial Focused Assessment:  Pt is up, talking w/ eyes open. A&Ox1. She was only able to get her name right but not her birthday. She is actively hallucinating. She reports seeing puppies, kittens, and says she was in a casino a few times. She has c/o becoming increasingly itchy and frequently needs to be redirected d/t trying to get out bed, ripping off lines, and O2 probe.  In her chart, C. Sherral Hammers, MD noted pt doesn't drink, however, B. Dahal, MD notes 'very occasional drinking'. Verbally the pt told me she doesn't drink at all and on admission charting it states 'never' when asked if she drinks.  On admission her Lipase was 3,205 and increased to 4,767 the next day. Albumin was 3.1 then 2.7 the next day. No urine tox. screen was taken on admission. AST & ALT have been within range until yesterday (06/17/20).  On my arrival to the unit, Pt was on RA w/ no EKG monitor on. Pt's RN says pt had been ripping everything off and at the time IV team was trying to place a new IV. Based on the charting and conversations w/ pt RN, pt's AMS started day shift (06/18/20) but previously she A&Ox4.   Interventions:  BS check - 65. Rechecked after 8oz apple juice - 75. Pt refused to drink anymore juice, claiming she was full Safety mitts were placed on pt hands  Plan of Care:  Recheck BS @ 0500a - asked [facility] NP for more frequent BS checks Check BMP & ammonia levels  Event Summary:  Pt is okay to stay OTF. Pt has a sitter order and a tech is currently sitting w/ her.   MD Notified: Kennon Holter, NP ([place] on call) Call Time: [place] Time: [place] Time: [place], RN

## 2020-06-19 NOTE — Progress Notes (Addendum)
Received call from telemetry. Patient o2 saturation 50%. Checked tele box sats noted to be in 60s to high 80s. Patient not in distress, RR even and unlabored. Placed on simple venturi mask at 14L. Patient O2 saturation comes up to 100% instantly. No other needs identified. Will continue to monitor.
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
## 2020-06-20 ENCOUNTER — Inpatient Hospital Stay (HOSPITAL_COMMUNITY): Payer: Medicare Other

## 2020-06-20 LAB — URINALYSIS, ROUTINE W REFLEX MICROSCOPIC
Bilirubin Urine: NEGATIVE
Glucose, UA: NEGATIVE mg/dL
Hgb urine dipstick: NEGATIVE
Ketones, ur: 20 mg/dL — AB
Leukocytes,Ua: NEGATIVE
Nitrite: NEGATIVE
Protein, ur: 30 mg/dL — AB
Specific Gravity, Urine: 1.019 (ref 1.005–1.030)
pH: 5 (ref 5.0–8.0)

## 2020-06-20 LAB — BASIC METABOLIC PANEL
Anion gap: 9 (ref 5–15)
BUN: 24 mg/dL — ABNORMAL HIGH (ref 8–23)
CO2: 23 mmol/L (ref 22–32)
Calcium: 8.4 mg/dL — ABNORMAL LOW (ref 8.9–10.3)
Chloride: 107 mmol/L (ref 98–111)
Creatinine, Ser: 0.98 mg/dL (ref 0.44–1.00)
GFR, Estimated: >60 mL/min (ref >60)
Glucose, Bld: 85 mg/dL (ref 70–99)
Potassium: 3.5 mmol/L (ref 3.5–5.1)
Sodium: 139 mmol/L (ref 135–145)

## 2020-06-20 LAB — CBC WITH DIFFERENTIAL/PLATELET
Abs Immature Granulocytes: 0.04 10*3/uL (ref 0.00–0.07)
Basophils Absolute: 0 10*3/uL (ref 0.0–0.1)
Basophils Relative: 0 %
Eosinophils Absolute: 0.7 10*3/uL — ABNORMAL HIGH (ref 0.0–0.5)
Eosinophils Relative: 7 %
HCT: 21.7 % — ABNORMAL LOW (ref 36.0–46.0)
Hemoglobin: 6.5 g/dL — CL (ref 12.0–15.0)
Immature Granulocytes: 0 %
Lymphocytes Relative: 7 %
Lymphs Abs: 0.7 10*3/uL (ref 0.7–4.0)
MCH: 26.1 pg (ref 26.0–34.0)
MCHC: 30 g/dL (ref 30.0–36.0)
MCV: 87.1 fL (ref 80.0–100.0)
Monocytes Absolute: 0.9 10*3/uL (ref 0.1–1.0)
Monocytes Relative: 10 %
Neutro Abs: 7.4 10*3/uL (ref 1.7–7.7)
Neutrophils Relative %: 76 %
Platelets: 207 10*3/uL (ref 150–400)
RBC: 2.49 MIL/uL — ABNORMAL LOW (ref 3.87–5.11)
RDW: 20.7 % — ABNORMAL HIGH (ref 11.5–15.5)
WBC: 9.7 10*3/uL (ref 4.0–10.5)
nRBC: 0.2 % (ref 0.0–0.2)

## 2020-06-20 LAB — LIPASE, BLOOD: Lipase: 91 U/L — ABNORMAL HIGH (ref 11–51)

## 2020-06-20 LAB — PREPARE RBC (CROSSMATCH)

## 2020-06-20 LAB — MAGNESIUM: Magnesium: 1.9 mg/dL (ref 1.7–2.4)

## 2020-06-20 LAB — LACTIC ACID, PLASMA
Lactic Acid, Venous: 0.6 mmol/L (ref 0.5–1.9)
Lactic Acid, Venous: 0.7 mmol/L (ref 0.5–1.9)

## 2020-06-20 LAB — PHOSPHORUS: Phosphorus: 2.8 mg/dL (ref 2.5–4.6)

## 2020-06-20 MED ORDER — SODIUM CHLORIDE 0.9% IV SOLUTION: Freq: Once | INTRAVENOUS | Status: DC

## 2020-06-20 MED ORDER — HYDROMORPHONE HCL 2 MG PO TABS
2.0000 mg | ORAL_TABLET | Freq: Four times a day (QID) | ORAL | Status: DC | PRN
  Administered 2020-06-24: 2 mg via ORAL
  Filled 2020-06-20: qty 1

## 2020-06-20 MED ORDER — METHADONE HCL 10 MG PO TABS
10.0000 mg | ORAL_TABLET | Freq: Four times a day (QID) | ORAL | Status: DC
  Administered 2020-06-20 – 2020-06-25 (×17): 10 mg via ORAL
  Filled 2020-06-20 (×17): qty 1

## 2020-06-20 NOTE — Progress Notes (Addendum)
In to see patient, patient has PRBC's going at 120 ml/hr per [facility]. Patient has 6L of nasal cannula in nose, with RR even and unlabored, hallucinating, following simple commands, when asked to turn for chuck pad change, patient on tele at this time HR tachy in the 120s, no other needs identified. Will continue to monitor.

## 2020-06-20 NOTE — Progress Notes (Signed)
Shift Summary:    Vitals signs from red mews protocol completed. Patient now back in green for mews. During vital sign check patient was able to arouse. When asked how she was doing, patient, endorsed, " fine." Patient endorsed, " she was feeling more rested." Patient slept the majority of the shift. Lengthy conversation took place with this nurse, niece, doctor and sister carolyn. Family endorsed during this conversation that they would not want any more blood transfusion in the event that her hemoglobin drops again, this nurse will pass on to nightshift. Remains on 6L of nasal cannula with RR even and unlabored. Urinalysis obtained during this shift. see results. No other needs identified. Will continue to monitor.   06/20/20 1740  Vitals  Temp 98 F (36.7 C)  Temp Source Axillary  BP 122/75  MAP (mmHg) 91  BP Location Right Arm  BP Method Automatic  Patient Position (if appropriate) Lying  Pulse Rate (!) 103  Pulse Rate Source Dinamap  Resp 17  Level of Consciousness  Level of Consciousness Alert  MEWS COLOR  MEWS Score Color Green  Oxygen Therapy  SpO2 100 %  O2 Device Nasal Cannula  O2 Flow Rate (L/min) 6 L/min  Pain Assessment  Pain Scale 0-10  Pain Score 0

## 2020-06-20 NOTE — Plan of Care (Signed)

## 2020-06-20 NOTE — Progress Notes (Signed)
Asked to evaluate patient d/t low temperature and somnolence with red MEWS  Upon assessment, pt arouses with stimulation and follows simple commands.  Pt has ongoing confusion and gets upset with stimulation.  Pt VS remains except she has a low temperature.  See flowsheet.  [place], NP already informed per pt primary nurse.  New orders received and pt currently has TEFL teacher applied.  Spoke with Kennon Holter, NP and a lactic will be added to AM labs.  NP currently satisfied with pt waking up when stimulated.  Pt states "I am ok" when asked.  Pt will remain in current location.  Primary RN to monitor and report.  Will readily assist if needed.

## 2020-06-20 NOTE — Progress Notes (Signed)
Patient had a critical lab value Hemoglobin at 6.5, notified NP new orders placed see (chart), 1 unit of PRBC ordered, called sister [name]) with second witness for consent, r/t patient AMS, side effects, common risk and potential problems was explained, sister verbalizing understanding. Patient is awake, alert and speaking with sister via cell phone, no s/s of distress noted will continue present plan of care.

## 2020-06-20 NOTE — Progress Notes (Signed)
PROGRESS NOTE  Rudene Poulsen  DOB: 1945/11/28  PCP: [name], MD KWI:[id number]  DOA: 06/15/2020  LOS: 5 days   Chief Complaint  Patient presents with   Abdominal Pain   Abnormal Lab   Brief narrative: Marlee Armenteros is a 75 y.o. 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 Patient presented to the ED on 06/15/2020 with complaint of significant upper abdominal pain associated with poor oral intake, decreased urine output, nausea.  PCP empirically started her on ciprofloxacin for possible UTI.  A CT scan of abdomen was also performed as an outpatient which showed findings concerning for pancreatitis in the pancreatic head and hence she was referred to ED. Labs in the ED showed lipase level elevated to 3200, creatinine elevated 2.1. She was admitted to hospitalist service for further evaluation and management. See below for details  Subjective: Patient was seen and examined this morning and again this afternoon with her family. Both of those times, patient was somnolent, will open eyes on verbal command, acknowledge my presence and fell right back to sleep.  Per RN, for most of the night, patient was asleep.  But, early this morning, patient was agitated and required IV Ativan.  Her hemoglobin this morning was low at 6.5, probably dilutional.  1 unit of PRBC was transfused.  I had a long conversation with patient's sister and niece at bedside today.   Assessment/Plan: Acute severe pancreatitis -Unclear etiology.  No alcohol in last several years per patient and family.  Imagings negative for biliary etiology.  Patient was treated with conservative management. -Abdominal pain improved, lipase level improved. -Off IV fluid now to avoid overhydration.  Oral hydration is compromised though because of poor mental status. Recent Labs  Lab  06/16/20 0345 06/17/20 0423 06/18/20 0352 06/19/20 0408 06/20/20 0117  LIPASE 4,767* 1,824* 206* 109* 91*   Acute metabolic encephalopathy -Over the last few days, patient's mental status has been very erratic.  She has been delirious frequently, has hallucinations, delusions.  She uses a number of mood altering medications at home which are gradually reintroduced.  She is also requiring IV Ativan intermittently.  Since last night, patient has been mostly calm and sedated. Will minimize the use of those medications to give her a chance to regain her normal mental status.   -Urinalysis with hazy amber color urine, rare bacteria.  No evidence of UTI.  Sinus tachycardia -Probably related to agitation.  Heart rate mostly over 100.  Continue to monitor.  Was not using any AV nodal blocking agent at home. -Echocardiogram with EF 60 to 65%, mild LVH.  AKI on CKD 2 -Baseline creatinine close to 1. Creatinine was elevated 2.12 on admission secondary to pancreatitis and dehydration. -Creatinine improved with hydration. Recent Labs    12/12/19 0731 12/20/19 1845 02/06/20 1444 02/09/20 1254 02/11/20 0354 06/15/20 2041 06/16/20 0345 06/17/20 0423 06/18/20 0352 06/19/20 0408 06/20/20 0112  BUN 29* 16  --  25* 20 49* 49* 40* 29* 26* 24*  CREATININE 0.95 0.88 1.00 1.35* 1.15* 2.12* 2.05* 1.21* 1.35* 1.21* 0.98   History of chronic diastolic CHF History of hypotension -Home meds include midodrine 10 mg 3 times daily, Lasix 20 mg twice daily as needed -Currently blood pressure stable. Continue to monitor for respiratory/CHF symptoms   Acute respiratory failure with hypoxia -Currently on 3 to 4 L of oxygen.  Oxygen saturations low  probably due to sedation, atelectasis and also has bilateral pleural effusion  Acute anemia  chronic iron deficiency anemia -Hemoglobin at baseline over 10 until November 2021. No active bleeding but in this hospitalization, her hemoglobin has been running low  mostly between 7 and 8.  Today it is low at 6.5.  1 unit of PRBC was transfused.  Partly dilutional because of aggressive hydration. Ferritin level is low as well. Unable to tolerate oral iron supplement. 1 dose of Feraheme was given on 3/25. -Continue PPI.   -Patient's family stated to me today that they do not want any further blood transfusion for the patient Recent Labs    06/16/20 0345 06/17/20 0423 06/18/20 0352 06/19/20 0408 06/20/20 0117  HGB 8.0* 7.2* 7.3* 7.1* 6.5*  MCV 88.0 88.8 89.0 87.4 87.1  VITAMINB12  --  452  --   --   --   FOLATE  --  9.9  --   --   --   FERRITIN 25 30  --   --   --   TIBC 461* 413  --   --   --   IRON 29 40  --   --   --   RETICCTPCT  --  3.8*  --   --   --    Chronic pain secondary Erythromelalgia Anxiety/depression -Home meds include methadone 10 mg 3 times daily, Dilaudid 2 mg every 4 hours as needed, tizanidine 4 mg 3 times daily, Keppra 250 mg twice daily, Bupropion 150 mg daily, buspirone 5 mg twice daily, Remeron 7.5 mg at bedtime, Belsomra 10 mg daily at bedtime -Her home meds were gradually resumed but because of oversedation, I have switched most of them to as needed.  Goals of care -DNR status. -I had a long conversation with patient's daughter and niece today 3/26.  They agree with current management with the hope that her medical status will improve in next 24 to 48 hours.  They also told me that patient has struggled a lot to this age and has expressed wish to them in several occasions to be hospice if she does not have a meaningful chance of recovery. -I see that the family is inclined towards that decision if no improvement next 24 to 48 hours.  We will continue daily assessment  Mobility: Uses a walker at baseline. Code Status:   Code Status: DNR  Nutritional status: Body mass index is 29.93 kg/m.     Diet Order            Diet full liquid Room service appropriate? Yes; Fluid consistency: Thin  Diet effective now                  DVT prophylaxis: enoxaparin (LOVENOX) injection 40 mg Start: 06/18/20 1000   Antimicrobials:  None Fluid: None Consultants: None Family Communication:  None  Status is: Inpatient  Remains inpatient appropriate because - acute pancreatitis, acute encephalopathy  Dispo: The patient is from: Home              Anticipated d/c is to: Unclear at this time.              Patient currently is not medically stable to d/c.   Difficult to place patient No    Infusions:   ferumoxytol Stopped (06/19/20 1333)    Scheduled Meds:  sodium chloride   Intravenous Once   buPROPion  150 mg Oral Daily   busPIRone  5 mg Oral BID   chlorhexidine  15 mL Mouth Rinse BID   cycloSPORINE  1 drop Both Eyes BID   diclofenac Sodium  4 g Topical BID   enoxaparin (LOVENOX) injection  40 mg Subcutaneous Q24H   levETIRAcetam  250 mg Oral BID   loteprednol  1 drop Both Eyes BID   mouth rinse  15 mL Mouth Rinse q12n4p   melatonin  5 mg Oral QHS   methadone  10 mg Oral Q6H   mirtazapine  7.5 mg Oral QHS   pantoprazole  40 mg Oral QHS   PARoxetine  10 mg Oral Daily   phosphorus  500 mg Oral BID   QUEtiapine  25 mg Oral BID   Suvorexant  10 mg Oral QHS    Antimicrobials: Anti-infectives (From admission, onward)   Start     Dose/Rate Route Frequency Ordered Stop   06/15/20 2330  ciprofloxacin (CIPRO) tablet 250 mg  Status:  Discontinued       Note to Pharmacy: Star date : 06/12/20     250 mg Oral 2 times daily 06/15/20 2318 06/16/20 1851      PRN meds: diphenhydrAMINE, fluticasone, HYDROmorphone, LORazepam, methocarbamol, naLOXone (NARCAN)  injection, ondansetron, polyethylene glycol, polyvinyl alcohol   Objective: Vitals:   06/20/20 0941 06/20/20 1333  BP: 135/65 132/64  Pulse: (!) 117 (!) 107  Resp: 18 16  Temp: 98.3 F (36.8 C) 98 F (36.7 C)  SpO2: 97% 98%    Intake/Output Summary (Last 24 hours) at 06/20/2020 1428 Last data filed at 06/20/2020 1150 Gross  per 24 hour  Intake 312 ml  Output 200 ml  Net 112 ml   Filed Weights   06/16/20 1608 06/17/20 0500 06/18/20 0500  Weight: 77.4 kg 79.7 kg 79.1 kg   Weight change:  Body mass index is 29.93 kg/m.   Physical Exam: General exam: Pleasant elderly Caucasian female.  Remains somnolent this afternoon. Skin: No rashes, lesions or ulcers. HEENT: Atraumatic, normocephalic, no obvious bleeding Lungs: Clear to auscultation bilaterally CVS: Regular rate and rhythm, no murmur GI/Abd soft, improving tenderness in epigastrium, nondistended, bowel sound present CNS: Somnolent.  Opens eyes on verbal command.  Falls right back to sleep.Marland Kitchen Psychiatry: Depressed look Extremities: No pedal edema, no calf tenderness  Data Review: I have personally reviewed the laboratory data and studies available.  Recent Labs  Lab 06/15/20 2041 06/16/20 0345 06/17/20 0423 06/18/20 0352 06/19/20 0408 06/20/20 0117  WBC 6.9 5.9 8.0 12.7* 12.3* 9.7  NEUTROABS 5.4  --  6.4 10.5* 9.8* 7.4  HGB 8.8* 8.0* 7.2* 7.3* 7.1* 6.5*  HCT 30.0* 27.1* 24.6* 25.1* 23.6* 21.7*  MCV 87.5 88.0 88.8 89.0 87.4 87.1  PLT 207 209 170 196 234 207   Recent Labs  Lab 06/16/20 0345 06/17/20 0423 06/18/20 0352 06/19/20 0408 06/20/20 0112 06/20/20 0117  NA 141 144 144 139 139  --   K 4.3 4.7 4.8 3.9 3.5  --   CL 106 111 110 109 107  --   CO2 25 26 23 23 23   --   GLUCOSE 77 67* 56* 107* 85  --   BUN 49* 40* 29* 26* 24*  --   CREATININE 2.05* 1.21* 1.35* 1.21* 0.98  --   CALCIUM 8.4* 8.8* 9.0 8.9 8.4*  --   MG 1.9 2.0 2.0 2.0  --  1.9  PHOS 4.8* 3.6 2.5 2.1*  --  2.8    F/u labs ordered Unresulted Labs (From admission, onward)  Start     Ordered   06/21/20 1660  Basic metabolic panel  Daily,   R     Question:  Specimen collection method  Answer:  Lab=Lab collect   06/20/20 0815   06/20/20 0821  Occult blood card to lab, stool  Once,   R        06/20/20 0820   06/17/20 0500  Magnesium  Daily,   R       06/16/20 0758   06/17/20 0500  Phosphorus  Daily,   R      06/16/20 0758   06/17/20 0500  CBC with Differential/Platelet  Daily,   R      06/16/20 0758   06/16/20 0759  Lipase, blood  Daily,   R      06/16/20 0758          Signed, Terrilee Croak, MD [facility] 06/20/2020

## 2020-06-20 NOTE — Progress Notes (Signed)
   06/20/20 0941  Vitals  Temp 98.3 F (36.8 C)  Temp Source Axillary  BP 135/65  MAP (mmHg) 80  BP Location Right Arm  BP Method Automatic  Patient Position (if appropriate) Lying  Pulse Rate (!) 117  Pulse Rate Source Dinamap  ECG Heart Rate (!) 117  Resp 18  MEWS COLOR  MEWS Score Color Yellow  Oxygen Therapy  SpO2 97 %  O2 Device Nasal Cannula  O2 Flow Rate (L/min) 6 L/min  Pain Assessment  Pain Scale 0-10  Pain Score Asleep  Post transfusion vitals. Patient arouses to stimuli, when attempting to get patient vitals signs, patient fought, and drifted back off to sleep. No other needs identified. Observer at bedside. Will continue to monitor.

## 2020-06-20 NOTE — Progress Notes (Addendum)
   06/19/20 2219  Provider Notification  Provider Name/Title Jeannette Corpus NP  Date Provider Notified 06/20/20  Time Provider Notified 2240  Notification Type Page  Notification Reason Change in status  Provider response See new orders  Date of Provider Response 06/19/20  Time of Provider Response 2245

## 2020-06-20 NOTE — Plan of Care (Signed)

## 2020-06-21 LAB — BASIC METABOLIC PANEL
Anion gap: 10 (ref 5–15)
BUN: 17 mg/dL (ref 8–23)
CO2: 28 mmol/L (ref 22–32)
Calcium: 8.6 mg/dL — ABNORMAL LOW (ref 8.9–10.3)
Chloride: 106 mmol/L (ref 98–111)
Creatinine, Ser: 0.98 mg/dL (ref 0.44–1.00)
GFR, Estimated: >60 mL/min (ref >60)
Glucose, Bld: 75 mg/dL (ref 70–99)
Potassium: 3.4 mmol/L — ABNORMAL LOW (ref 3.5–5.1)
Sodium: 144 mmol/L (ref 135–145)

## 2020-06-21 LAB — CBC WITH DIFFERENTIAL/PLATELET
Abs Immature Granulocytes: 0.06 10*3/uL (ref 0.00–0.07)
Basophils Absolute: 0 10*3/uL (ref 0.0–0.1)
Basophils Relative: 0 %
Eosinophils Absolute: 0.6 10*3/uL — ABNORMAL HIGH (ref 0.0–0.5)
Eosinophils Relative: 7 %
HCT: 25.4 % — ABNORMAL LOW (ref 36.0–46.0)
Hemoglobin: 7.7 g/dL — ABNORMAL LOW (ref 12.0–15.0)
Immature Granulocytes: 1 %
Lymphocytes Relative: 10 %
Lymphs Abs: 0.9 10*3/uL (ref 0.7–4.0)
MCH: 26.3 pg (ref 26.0–34.0)
MCHC: 30.3 g/dL (ref 30.0–36.0)
MCV: 86.7 fL (ref 80.0–100.0)
Monocytes Absolute: 0.8 10*3/uL (ref 0.1–1.0)
Monocytes Relative: 9 %
Neutro Abs: 6.4 10*3/uL (ref 1.7–7.7)
Neutrophils Relative %: 73 %
Platelets: 304 10*3/uL (ref 150–400)
RBC: 2.93 MIL/uL — ABNORMAL LOW (ref 3.87–5.11)
RDW: 20.1 % — ABNORMAL HIGH (ref 11.5–15.5)
WBC: 8.7 10*3/uL (ref 4.0–10.5)
nRBC: 0.2 % (ref 0.0–0.2)

## 2020-06-21 LAB — BPAM RBC
Blood Product Expiration Date: 202204162359
ISSUE DATE / TIME: 202203260642
Unit Type and Rh: 5100

## 2020-06-21 LAB — TYPE AND SCREEN
ABO/RH(D): O POS
Antibody Screen: NEGATIVE
Unit division: 0

## 2020-06-21 LAB — PHOSPHORUS: Phosphorus: 3.5 mg/dL (ref 2.5–4.6)

## 2020-06-21 LAB — LIPASE, BLOOD: Lipase: 102 U/L — ABNORMAL HIGH (ref 11–51)

## 2020-06-21 LAB — MAGNESIUM: Magnesium: 1.9 mg/dL (ref 1.7–2.4)

## 2020-06-21 MED ORDER — K PHOS MONO-SOD PHOS DI & MONO 155-852-130 MG PO TABS
500.0000 mg | ORAL_TABLET | Freq: Two times a day (BID) | ORAL | Status: DC
  Administered 2020-06-21 – 2020-06-22 (×3): 500 mg via ORAL
  Filled 2020-06-21 (×5): qty 2

## 2020-06-21 NOTE — Progress Notes (Signed)
OT Cancellation Note  Patient Details Name: Kelly Curtis MRN: [id number] DOB: 11-28-1945   Cancelled Treatment:    Reason Eval/Treat Not Completed: Medical issues which prohibited therapy. Patient confused, agitated, unsafe and just received Ativan. Will f/u as able.   Brycen Bean L Amare Bail 06/21/2020, 2:47 PM

## 2020-06-21 NOTE — Progress Notes (Signed)
PROGRESS NOTE  Kelly Curtis  DOB: 09/23/1945  PCP: [name], MD WPY:[id number]  DOA: 06/15/2020  LOS: 6 days   Chief Complaint  Patient presents with  . Abdominal Pain  . Abnormal Lab   Brief narrative: Kelly Curtis is a 75 y.o. 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 Patient presented to the ED on 06/15/2020 with complaint of significant upper abdominal pain associated with poor oral intake, decreased urine output, nausea.  PCP empirically started her on ciprofloxacin for possible UTI.  A CT scan of abdomen was also performed as an outpatient which showed findings concerning for pancreatitis in the pancreatic head and hence she was referred to ED. Labs in the ED showed lipase level elevated to 3200, creatinine elevated 2.1. She was admitted to hospitalist service for further evaluation and management. See below for details  Subjective: Patient was seen and examined this morning. Lying on bed.  More awake this morning.  Not oriented but able to answer few questions.  Not restless or agitated.  He was keeping her oxygen off but he still breathing comfortably.   No family at bedside this morning  Assessment/Plan: Acute severe pancreatitis -Unclear etiology.  No alcohol in last several years per patient and family.  Imagings negative for biliary etiology. Patient was treated with conservative management. -Abdominal pain improved, lipase level improved. -At this time, patient is off IV hydration.  Encourage oral hydration.  On full liquid diet.  I would advance to soft diet if she wants to eat.   Recent Labs  Lab 06/17/20 0423 06/18/20 0352 06/19/20 0408 06/20/20 0117 06/21/20 0354  LIPASE 1,824* 206* 109* 91* 053*   Acute metabolic encephalopathy -Mental status somewhat better this morning.  Disoriented but able to answer  questions.  Not agitated to me.   -Continue medication adjustment.  Should be able to give scheduled morning meds today.   -Continue to monitor mental status  Sinus tachycardia -Probably related to agitation.  Seems to be improving overall.  Continue to monitor.  Was not using any AV nodal blocking agent at home. -Echocardiogram with EF 60 to 65%, mild LVH.  AKI on CKD 2 -Baseline creatinine close to 1. Creatinine was elevated to 2.12 on admission secondary to pancreatitis and dehydration. -Creatinine improved with hydration. Recent Labs    12/20/19 1845 02/06/20 1444 02/09/20 1254 02/11/20 0354 06/15/20 2041 06/16/20 0345 06/17/20 0423 06/18/20 0352 06/19/20 0408 06/20/20 0112 06/21/20 0354  BUN 16  --  25* 20 49* 49* 40* 29* 26* 24* 17  CREATININE 0.88 1.00 1.35* 1.15* 2.12* 2.05* 1.21* 1.35* 1.21* 0.98 0.98   History of chronic diastolic CHF History of hypotension -Home meds include midodrine 10 mg 3 times daily, Lasix 20 mg twice daily as needed -Currently blood pressure stable. Continue to monitor for respiratory/CHF symptoms   Acute respiratory failure with hypoxia -Currently on 3 to 4 L of oxygen.  Oxygen saturations low probably due to sedation, atelectasis and also has bilateral pleural effusion  Acute anemia  chronic iron deficiency anemia -Hemoglobin at baseline over 10 until November 2021. No active bleeding but in this hospitalization, her hemoglobin has been running low mostly between 7 and 8.   -Monitor PRBC transfused on 3/26 for hemoglobin of 6.5.  Improved to 7.7 today.   -Ferritin level is low as well. Unable to tolerate oral iron supplement. 1 dose of  Feraheme was given on 3/25. -Continue PPI.   -Patient's family does not want any further blood transfusion for the patient Recent Labs    06/16/20 0345 06/17/20 0423 06/18/20 0352 06/19/20 0408 06/20/20 0117 06/21/20 0354  HGB 8.0* 7.2* 7.3* 7.1* 6.5* 7.7*  MCV 88.0 88.8 89.0 87.4 87.1 86.7   VITAMINB12  --  452  --   --   --   --   FOLATE  --  9.9  --   --   --   --   FERRITIN 25 30  --   --   --   --   TIBC 461* 413  --   --   --   --   IRON 29 40  --   --   --   --   RETICCTPCT  --  3.8*  --   --   --   --    Chronic pain secondary Erythromelalgia Anxiety/depression -Home meds include methadone 10 mg 3 times daily, Dilaudid 2 mg every 4 hours as needed, tizanidine 4 mg 3 times daily, Keppra 250 mg twice daily, Bupropion 150 mg daily, buspirone 5 mg twice daily, Remeron 7.5 mg at bedtime, Belsomra 10 mg daily at bedtime -At this time, medications are being adjusted to avoid oversedation as well as agitation.  Goals of care -DNR status. -I had a long conversation with patient's daughter and niece on 3/26.  They agree with current management with the hope that her her clinical status will improve in next 24 to 48 hours.  They also mentioned to me that patient has medically struggled a lot to this age and has expressed her wish to them in several occasions to be hospice if she does not have a meaningful chance of recovery. -I see that the family is inclined towards that decision if no improvement next 24 to 48 hours.  We will continue daily assessment.  Mobility: Uses a walker at baseline.  Reevaluation by PT when mental status is better Code Status:   Code Status: DNR  Nutritional status: Body mass index is 31.94 kg/m.     Diet Order            DIET SOFT Room service appropriate? Yes; Fluid consistency: Thin  Diet effective now                 DVT prophylaxis: enoxaparin (LOVENOX) injection 40 mg Start: 06/18/20 1000   Antimicrobials:  None Fluid: None Consultants: None Family Communication:  None  Status is: Inpatient  Remains inpatient appropriate because - acute pancreatitis, acute encephalopathy  Dispo: The patient is from: Home              Anticipated d/c is to: Unclear at this time.              Patient currently is not medically stable to d/c.    Difficult to place patient No    Infusions:  . ferumoxytol Stopped (06/19/20 1333)    Scheduled Meds: . sodium chloride   Intravenous Once  . buPROPion  150 mg Oral Daily  . busPIRone  5 mg Oral BID  . chlorhexidine  15 mL Mouth Rinse BID  . cycloSPORINE  1 drop Both Eyes BID  . diclofenac Sodium  4 g Topical BID  . enoxaparin (LOVENOX) injection  40 mg Subcutaneous Q24H  . levETIRAcetam  250 mg Oral BID  . loteprednol  1 drop Both Eyes BID  . mouth rinse  15 mL  Mouth Rinse q12n4p  . melatonin  5 mg Oral QHS  . methadone  10 mg Oral Q6H  . mirtazapine  7.5 mg Oral QHS  . pantoprazole  40 mg Oral QHS  . PARoxetine  10 mg Oral Daily  . QUEtiapine  25 mg Oral BID  . Suvorexant  10 mg Oral QHS    Antimicrobials: Anti-infectives (From admission, onward)   Start     Dose/Rate Route Frequency Ordered Stop   06/15/20 2330  ciprofloxacin (CIPRO) tablet 250 mg  Status:  Discontinued       Note to Pharmacy: Star date : 06/12/20     250 mg Oral 2 times daily 06/15/20 2318 06/16/20 1851      PRN meds: diphenhydrAMINE, fluticasone, HYDROmorphone, LORazepam, methocarbamol, naLOXone (NARCAN)  injection, ondansetron, polyethylene glycol, polyvinyl alcohol   Objective: Vitals:   06/20/20 2139 06/21/20 0500  BP: 125/72 130/69  Pulse: (!) 113 (!) 111  Resp: 20 19  Temp: 97.6 F (36.4 C) 97.8 F (36.6 C)  SpO2: 98% 96%    Intake/Output Summary (Last 24 hours) at 06/21/2020 1118 Last data filed at 06/21/2020 0235 Gross per 24 hour  Intake -  Output 700 ml  Net -700 ml   Filed Weights   06/17/20 0500 06/18/20 0500 06/21/20 0500  Weight: 79.7 kg 79.1 kg 84.4 kg   Weight change:  Body mass index is 31.94 kg/m.   Physical Exam: General exam: Pleasant elderly Caucasian female.  Not in physical distress Skin: No rashes, lesions or ulcers. HEENT: Atraumatic, normocephalic, no obvious bleeding Lungs: Clear to auscultation bilaterally CVS: Regular rate and rhythm, no  murmur GI/Abd soft, improving tenderness in epigastrium, nondistended, bowel sound present CNS: Awake to me this morning.  Disoriented but able to answer simple questions Psychiatry: Depressed look Extremities: No pedal edema, no calf tenderness  Data Review: I have personally reviewed the laboratory data and studies available.  Recent Labs  Lab 06/17/20 0423 06/18/20 0352 06/19/20 0408 06/20/20 0117 06/21/20 0354  WBC 8.0 12.7* 12.3* 9.7 8.7  NEUTROABS 6.4 10.5* 9.8* 7.4 6.4  HGB 7.2* 7.3* 7.1* 6.5* 7.7*  HCT 24.6* 25.1* 23.6* 21.7* 25.4*  MCV 88.8 89.0 87.4 87.1 86.7  PLT 170 196 234 207 304   Recent Labs  Lab 06/17/20 0423 06/18/20 0352 06/19/20 0408 06/20/20 0112 06/20/20 0117 06/21/20 0354  NA 144 144 139 139  --  144  K 4.7 4.8 3.9 3.5  --  3.4*  CL 111 110 109 107  --  106  CO2 26 23 23 23   --  28  GLUCOSE 67* 56* 107* 85  --  75  BUN 40* 29* 26* 24*  --  17  CREATININE 1.21* 1.35* 1.21* 0.98  --  0.98  CALCIUM 8.8* 9.0 8.9 8.4*  --  8.6*  MG 2.0 2.0 2.0  --  1.9 1.9  PHOS 3.6 2.5 2.1*  --  2.8 3.5    F/u labs ordered Unresulted Labs (From admission, onward)          Start     Ordered   06/21/20 2595  Basic metabolic panel  Daily,   R     Question:  Specimen collection method  Answer:  Lab=Lab collect   06/20/20 0815   06/20/20 0821  Occult blood card to lab, stool  Once,   R        06/20/20 0820   06/17/20 0500  Magnesium  Daily,   R  06/16/20 0758   06/17/20 0500  Phosphorus  Daily,   R      06/16/20 0758   06/16/20 0759  Lipase, blood  Daily,   R      06/16/20 0758          Signed, Terrilee Croak, MD [facility] 06/21/2020

## 2020-06-21 NOTE — Progress Notes (Addendum)
No observer available today due to staffing. Patient currently attempting to get OOB, not easily redirected. RR even and unlabored, Endorses I am trying to go to the bathroom, explained to patient, " she is very unsteady on her feet, it is not safe to get up, you have a pure-wick draining your urine." Patient refusing to put oxygen on, stating, " leave me alone."  Bed low and locked. Called [name] sister and asked her if she would be able to come and sit with sister, carolyn endorses she would. Bed alarm activated. Will continue to monitor.

## 2020-06-21 NOTE — Plan of Care (Signed)

## 2020-06-22 ENCOUNTER — Inpatient Hospital Stay (HOSPITAL_COMMUNITY): Payer: Medicare Other

## 2020-06-22 LAB — BASIC METABOLIC PANEL
Anion gap: 11 (ref 5–15)
BUN: 17 mg/dL (ref 8–23)
CO2: 25 mmol/L (ref 22–32)
Calcium: 8.4 mg/dL — ABNORMAL LOW (ref 8.9–10.3)
Chloride: 107 mmol/L (ref 98–111)
Creatinine, Ser: 0.94 mg/dL (ref 0.44–1.00)
GFR, Estimated: >60 mL/min (ref >60)
Glucose, Bld: 63 mg/dL — ABNORMAL LOW (ref 70–99)
Potassium: 3.3 mmol/L — ABNORMAL LOW (ref 3.5–5.1)
Sodium: 143 mmol/L (ref 135–145)

## 2020-06-22 LAB — MAGNESIUM: Magnesium: 2 mg/dL (ref 1.7–2.4)

## 2020-06-22 LAB — PHOSPHORUS: Phosphorus: 3.8 mg/dL (ref 2.5–4.6)

## 2020-06-22 LAB — LIPASE, BLOOD: Lipase: 85 U/L — ABNORMAL HIGH (ref 11–51)

## 2020-06-22 NOTE — Care Management Important Message (Signed)
Important Message  Patient Details IM Letter given to Patient. Name: Kelly Curtis MRN: [id number] Date of Birth: 08-17-1945   Medicare Important Message Given:  Yes     Kerin Salen 06/22/2020, 11:54 AM

## 2020-06-22 NOTE — Progress Notes (Signed)
PT Cancellation Note  Patient Details Name: Kelly Curtis MRN: [id number] DOB: 07/22/1945   Cancelled Treatment:    Reason Eval/Treat Not Completed: Fatigue/lethargy limiting ability to participate Pt received Ativan 2am. Pt sleeping most of the day and RN reports immediately back to sleep upon waking.  Will check back tomorrow.   Kelly Curtis,[name] 06/22/2020, 2:39 PM Jannette Spanner PT, DPT Acute [facility] Services Pager: [phone] Office: [phone]

## 2020-06-22 NOTE — Progress Notes (Signed)
PROGRESS NOTE  Kelly Curtis  DOB: [birth year]  PCP: Kelly Manes, MD NLG:[id number]  DOA: 06/15/2020  LOS: 7 days   Chief Complaint  Patient presents with  . Abdominal Pain  . Abnormal Lab   Brief narrative: Kelly Curtis is a 75 y.o. 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 Patient presented to the ED on 06/15/2020 with complaint of significant upper abdominal pain associated with poor oral intake, decreased urine output, nausea.  PCP empirically started her on ciprofloxacin for possible UTI.  A CT scan of abdomen was also performed as an outpatient which showed findings concerning for pancreatitis in the pancreatic head and hence she was referred to ED. Labs in the ED showed lipase level elevated to 3200, creatinine elevated 2.1. She was admitted to hospitalist service for further evaluation and management. See below for details  Subjective: Patient was seen and examined this morning. Sleeping, arousable remains on oxygen.  Offered breakfast.  States he is not interested. Required a dose of IV Ativan last night to rest.  Sitter at bedside.  No family at bedside this morning.  Assessment/Plan: Acute severe pancreatitis -resolving -Unclear etiology.  No alcohol in last several years per patient and family.  Imagings negative for biliary etiology. Patient was treated with conservative management. -Abdominal pain improved, lipase level improved. -At this time, patient is off IV hydration. Encourage oral hydration. Currently on soft diet. Recent Labs  Lab 06/18/20 0352 06/19/20 0408 06/20/20 0117 06/21/20 0354 06/22/20 0358  LIPASE 206* 109* 91* 102* 85*   Acute metabolic encephalopathy -Mental status seems to be waxing and waning. Not agitated to me this morning. Should be able to take her scheduled morning meds today. -Continue  medication adjustment.. -Continue to monitor mental status  Sinus tachycardia -Probably related to agitation.  Seems to be improving overall.  Continue to monitor.  Was not using any AV nodal blocking agent at home. -Echocardiogram with EF 60 to 65%, mild LVH.  AKI on CKD 2 -Baseline creatinine close to 1. Creatinine was elevated to 2.12 on admission secondary to pancreatitis and dehydration. -Creatinine improved with hydration. Recent Labs    02/09/20 1254 02/11/20 0354 06/15/20 2041 06/16/20 0345 06/17/20 0423 06/18/20 0352 06/19/20 0408 06/20/20 0112 06/21/20 0354 06/22/20 0358  BUN 25* 20 49* 49* 40* 29* 26* 24* 17 17  CREATININE 1.35* 1.15* 2.12* 2.05* 1.21* 1.35* 1.21* 0.98 0.98 0.94   History of chronic diastolic CHF History of hypotension -Home meds include midodrine 10 mg 3 times daily, Lasix 20 mg twice daily as needed -Currently blood pressure stable. Continue to monitor for respiratory/CHF symptoms   Acute respiratory failure with hypoxia -Currently on 3 to 4 L of oxygen. Oxygen saturations low probably due to sedation, atelectasis and also has bilateral pleural effusion  Acute anemia  chronic iron deficiency anemia -Hemoglobin at baseline over 10 until November 2021. No active bleeding but in this hospitalization, her hemoglobin has been running low mostly between 7 and 8.   -Monitor PRBC transfused on 3/26 for hemoglobin of 6.5.  Improved to 7.7 on last check on 3/27.  Repeat blood work tomorrow.   -Ferritin level is low as well. Unable to tolerate oral iron supplement. 1 dose of Feraheme was given on 3/25. -Continue PPI.   -Patient's family does not want any further blood transfusion for the patient Recent Labs    06/16/20 0345  06/17/20 0423 06/18/20 0352 06/19/20 0408 06/20/20 0117 06/21/20 0354  HGB 8.0* 7.2* 7.3* 7.1* 6.5* 7.7*  MCV 88.0 88.8 89.0 87.4 87.1 86.7  VITAMINB12  --  452  --   --   --   --   FOLATE  --  9.9  --   --   --   --    FERRITIN 25 30  --   --   --   --   TIBC 461* 413  --   --   --   --   IRON 29 40  --   --   --   --   RETICCTPCT  --  3.8*  --   --   --   --    Chronic pain secondary Erythromelalgia Anxiety/depression -Home meds include methadone 10 mg 3 times daily, Dilaudid 2 mg every 4 hours as needed, tizanidine 4 mg 3 times daily, Keppra 250 mg twice daily, Bupropion 150 mg daily, buspirone 5 mg twice daily, Remeron 7.5 mg at bedtime, Belsomra 10 mg daily at bedtime -At this time, medications are being adjusted to avoid oversedation as well as agitation.  Goals of care -DNR status. -I had a long conversation with patient's daughter and niece on 3/26.  -Family is open to the idea of palliative care and hospice care if her clinical progression stops.    Mobility: Uses a walker at baseline.  Reevaluation by PT is needed when mental status improves. Code Status:   Code Status: DNR  Nutritional status: Body mass index is 29.82 kg/m.     Diet Order            DIET SOFT Room service appropriate? Yes; Fluid consistency: Thin  Diet effective now                 DVT prophylaxis: enoxaparin (LOVENOX) injection 40 mg Start: 06/18/20 1000   Antimicrobials:  None Fluid: None Consultants: None Family Communication:  None  Status is: Inpatient  Remains inpatient appropriate because - acute pancreatitis, acute encephalopathy  Dispo: The patient is from: Home              Anticipated d/c is to: Unclear at this time.              Patient currently is not medically stable to d/c.   Difficult to place patient No    Infusions:  . ferumoxytol Stopped (06/19/20 1333)    Scheduled Meds: . sodium chloride   Intravenous Once  . buPROPion  150 mg Oral Daily  . busPIRone  5 mg Oral BID  . chlorhexidine  15 mL Mouth Rinse BID  . cycloSPORINE  1 drop Both Eyes BID  . diclofenac Sodium  4 g Topical BID  . enoxaparin (LOVENOX) injection  40 mg Subcutaneous Q24H  . levETIRAcetam  250 mg Oral BID   . loteprednol  1 drop Both Eyes BID  . mouth rinse  15 mL Mouth Rinse q12n4p  . melatonin  5 mg Oral QHS  . methadone  10 mg Oral Q6H  . mirtazapine  7.5 mg Oral QHS  . pantoprazole  40 mg Oral QHS  . PARoxetine  10 mg Oral Daily  . phosphorus  500 mg Oral BID  . QUEtiapine  25 mg Oral BID  . Suvorexant  10 mg Oral QHS    Antimicrobials: Anti-infectives (From admission, onward)   Start     Dose/Rate Route Frequency Ordered Stop   06/15/20 2330  ciprofloxacin (CIPRO) tablet 250 mg  Status:  Discontinued       Note to Pharmacy: Star date : 06/12/20     250 mg Oral 2 times daily 06/15/20 2318 06/16/20 1851      PRN meds: diphenhydrAMINE, fluticasone, HYDROmorphone, LORazepam, methocarbamol, naLOXone (NARCAN)  injection, ondansetron, polyethylene glycol, polyvinyl alcohol   Objective: Vitals:   06/21/20 2018 06/22/20 0707  BP: 125/65 (!) 133/93  Pulse: (!) 111 (!) 111  Resp: 20   Temp: 98.2 F (36.8 C) 97.8 F (36.6 C)  SpO2: 92% 94%    Intake/Output Summary (Last 24 hours) at 06/22/2020 1126 Last data filed at 06/22/2020 8101 Gross per 24 hour  Intake -  Output 250 ml  Net -250 ml   Filed Weights   06/18/20 0500 06/21/20 0500 06/22/20 0607  Weight: 79.1 kg 84.4 kg 78.8 kg   Weight change: -5.6 kg Body mass index is 29.82 kg/m.   Physical Exam: General exam: Pleasant elderly Caucasian female.  Not in physical distress Skin: No rashes, lesions or ulcers. HEENT: Atraumatic, normocephalic, no obvious bleeding Lungs: Clear to auscultation bilaterally CVS: Regular rate and rhythm, no murmur GI/Abd soft, improving tenderness in epigastrium, nondistended, bowel sound present CNS: Opens eyes on verbal command.  Disoriented.  Not restless or agitated to me this morning  psychiatry: Depressed look Extremities: No pedal edema, no calf tenderness  Data Review: I have personally reviewed the laboratory data and studies available.  Recent Labs  Lab 06/17/20 0423  06/18/20 0352 06/19/20 0408 06/20/20 0117 06/21/20 0354  WBC 8.0 12.7* 12.3* 9.7 8.7  NEUTROABS 6.4 10.5* 9.8* 7.4 6.4  HGB 7.2* 7.3* 7.1* 6.5* 7.7*  HCT 24.6* 25.1* 23.6* 21.7* 25.4*  MCV 88.8 89.0 87.4 87.1 86.7  PLT 170 196 234 207 304   Recent Labs  Lab 06/18/20 0352 06/19/20 0408 06/20/20 0112 06/20/20 0117 06/21/20 0354 06/22/20 0358  NA 144 139 139  --  144 143  K 4.8 3.9 3.5  --  3.4* 3.3*  CL 110 109 107  --  106 107  CO2 23 23 23   --  28 25  GLUCOSE 56* 107* 85  --  75 63*  BUN 29* 26* 24*  --  17 17  CREATININE 1.35* 1.21* 0.98  --  0.98 0.94  CALCIUM 9.0 8.9 8.4*  --  8.6* 8.4*  MG 2.0 2.0  --  1.9 1.9 2.0  PHOS 2.5 2.1*  --  2.8 3.5 3.8    F/u labs ordered Unresulted Labs (From admission, onward)          Start     Ordered   06/21/20 7510  Basic metabolic panel  Daily,   R     Question:  Specimen collection method  Answer:  Lab=Lab collect   06/20/20 0815   06/20/20 0821  Occult blood card to lab, stool  Once,   R        06/20/20 0820   06/17/20 0500  Magnesium  Daily,   R      06/16/20 0758   06/17/20 0500  Phosphorus  Daily,   R      06/16/20 0758   06/16/20 0759  Lipase, blood  Daily,   R      06/16/20 0758   Unscheduled  CBC with Differential/Platelet  Daily,   R     Question:  Specimen collection method  Answer:  Lab=Lab collect   06/22/20 1126  Signed, Terrilee Croak, MD [facility] 06/22/2020

## 2020-06-23 LAB — CBC WITH DIFFERENTIAL/PLATELET
Abs Immature Granulocytes: 0.06 10*3/uL (ref 0.00–0.07)
Basophils Absolute: 0 10*3/uL (ref 0.0–0.1)
Basophils Relative: 0 %
Eosinophils Absolute: 0.5 10*3/uL (ref 0.0–0.5)
Eosinophils Relative: 5 %
HCT: 25.7 % — ABNORMAL LOW (ref 36.0–46.0)
Hemoglobin: 7.8 g/dL — ABNORMAL LOW (ref 12.0–15.0)
Immature Granulocytes: 1 %
Lymphocytes Relative: 12 %
Lymphs Abs: 1.1 10*3/uL (ref 0.7–4.0)
MCH: 25.8 pg — ABNORMAL LOW (ref 26.0–34.0)
MCHC: 30.4 g/dL (ref 30.0–36.0)
MCV: 85.1 fL (ref 80.0–100.0)
Monocytes Absolute: 0.9 10*3/uL (ref 0.1–1.0)
Monocytes Relative: 10 %
Neutro Abs: 7.2 10*3/uL (ref 1.7–7.7)
Neutrophils Relative %: 72 %
Platelets: 367 10*3/uL (ref 150–400)
RBC: 3.02 MIL/uL — ABNORMAL LOW (ref 3.87–5.11)
RDW: 20.1 % — ABNORMAL HIGH (ref 11.5–15.5)
WBC: 9.8 10*3/uL (ref 4.0–10.5)
nRBC: 0 % (ref 0.0–0.2)

## 2020-06-23 LAB — BASIC METABOLIC PANEL
Anion gap: 11 (ref 5–15)
BUN: 17 mg/dL (ref 8–23)
CO2: 29 mmol/L (ref 22–32)
Calcium: 8.6 mg/dL — ABNORMAL LOW (ref 8.9–10.3)
Chloride: 104 mmol/L (ref 98–111)
Creatinine, Ser: 0.78 mg/dL (ref 0.44–1.00)
GFR, Estimated: >60 mL/min (ref >60)
Glucose, Bld: 110 mg/dL — ABNORMAL HIGH (ref 70–99)
Potassium: 2.8 mmol/L — ABNORMAL LOW (ref 3.5–5.1)
Sodium: 144 mmol/L (ref 135–145)

## 2020-06-23 LAB — LIPASE, BLOOD: Lipase: 88 U/L — ABNORMAL HIGH (ref 11–51)

## 2020-06-23 LAB — PHOSPHORUS: Phosphorus: 3.7 mg/dL (ref 2.5–4.6)

## 2020-06-23 LAB — MAGNESIUM: Magnesium: 2 mg/dL (ref 1.7–2.4)

## 2020-06-23 MED ORDER — POTASSIUM CHLORIDE CRYS ER 20 MEQ PO TBCR
40.0000 meq | EXTENDED_RELEASE_TABLET | ORAL | Status: AC
  Administered 2020-06-23 (×2): 40 meq via ORAL
  Filled 2020-06-23 (×2): qty 2

## 2020-06-23 MED ORDER — ACETAMINOPHEN 325 MG PO TABS
650.0000 mg | ORAL_TABLET | Freq: Four times a day (QID) | ORAL | Status: DC | PRN
  Administered 2020-06-23: 325 mg via ORAL
  Administered 2020-06-24: 650 mg via ORAL
  Filled 2020-06-23 (×2): qty 2

## 2020-06-23 NOTE — Progress Notes (Signed)
PROGRESS NOTE  Kelly Curtis  DOB: [birth year]  PCP: [name], MD OFB:[id number]  DOA: 06/15/2020  LOS: 8 days   Chief Complaint  Patient presents with  . Abdominal Pain  . Abnormal Lab   Brief narrative: Kelly Curtis is a 75 y.o. 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 Patient presented to the ED on 06/15/2020 with complaint of significant upper abdominal pain associated with poor oral intake, decreased urine output, nausea.  PCP empirically started her on ciprofloxacin for possible UTI.  A CT scan of abdomen was also performed as an outpatient which showed findings concerning for pancreatitis in the pancreatic head and hence she was referred to ED. Labs in the ED showed lipase level elevated to 3200, creatinine elevated 2.1. She was admitted to hospitalist service for further evaluation and management. See below for details.  Subjective: Patient was seen and examined this morning. More awake this morning.  She had about abdomen after several days and it is all liquid.  Assessment/Plan: Acute severe pancreatitis -resolving -Unclear etiology. No alcohol in last several years per patient and family.  Imagings negative for biliary etiology. Patient was treated with conservative management. -Abdominal pain improved, lipase level improved. -At this time, patient is off IV hydration. Encourage oral hydration. Currently on soft diet. Recent Labs  Lab 06/19/20 0408 06/20/20 0117 06/21/20 0354 06/22/20 0358 06/23/20 0419  LIPASE 109* 91* 102* 85* 88*   Acute metabolic encephalopathy -Mental status seems to be waxing and waning.  Seems somewhat better this morning since he had a good night sleep last night.   -3/20, CT scan of head without any acute intracranial abnormality. -Continue medication adjustment.. -Continue to monitor  mental status  Sinus tachycardia -Probably related to agitation. Seems to be improving overall.  Continue to monitor.  Was not using any AV nodal blocking agent at home. -Echocardiogram with EF 60 to 65%, mild LVH.  AKI on CKD 2 -Baseline creatinine close to 1. Creatinine was elevated to 2.12 on admission secondary to pancreatitis and dehydration. -Creatinine improved with hydration. Recent Labs    02/11/20 0354 06/15/20 2041 06/16/20 0345 06/17/20 0423 06/18/20 0352 06/19/20 0408 06/20/20 0112 06/21/20 0354 06/22/20 0358 06/23/20 0419  BUN 20 49* 49* 40* 29* 26* 24* 17 17 17   CREATININE 1.15* 2.12* 2.05* 1.21* 1.35* 1.21* 0.98 0.98 0.94 0.78   History of chronic diastolic CHF History of hypotension -Home meds include midodrine 10 mg 3 times daily, Lasix 20 mg twice daily as needed -Currently blood pressure stable. Continue to monitor for respiratory/CHF symptoms   Acute respiratory failure with hypoxia -Initially required up to 6 L of oxygen by nasal cannula.  Currently on room air.    Acute anemia  chronic iron deficiency anemia -Hemoglobin at baseline over 10 until November 2021. No active bleeding but in this hospitalization, her hemoglobin has been running low mostly between 7 and 8.   -Monitor PRBC transfused on 3/26 for hemoglobin of 6.5.  Improved to 7.9 today.  Recheck tomorrow. -Ferritin level is low as well. Unable to tolerate oral iron supplement. 1 dose of Feraheme was given on 3/25. -Continue PPI.   -Patient's family does not want any further blood transfusion for the patient Recent Labs    06/16/20 0345 06/17/20 0423 06/18/20 0352 06/19/20 0408 06/20/20 0117 06/21/20 0354 06/23/20 0419  HGB 8.0* 7.2* 7.3* 7.1* 6.5* 7.7* 7.8*  MCV 88.0 88.8 89.0 87.4 87.1 86.7 85.1  VITAMINB12  --  452  --   --   --   --   --   FOLATE  --  9.9  --   --   --   --   --   FERRITIN 25 30  --   --   --   --   --   TIBC 461* 413  --   --   --   --   --   IRON 29 40  --    --   --   --   --   RETICCTPCT  --  3.8*  --   --   --   --   --    Chronic pain secondary Erythromelalgia Anxiety/depression -Home meds include methadone 10 mg 3 times daily, Dilaudid 2 mg every 4 hours as needed, tizanidine 4 mg 3 times daily, Keppra 250 mg twice daily, Bupropion 150 mg daily, buspirone 5 mg twice daily, Remeron 7.5 mg at bedtime, Belsomra 10 mg daily at bedtime -At this time, medications are being adjusted to avoid oversedation as well as agitation. -Currently on methadone, as needed Dilaudid, tizanidine, bupropion, buspirone, Belsomra, Remeron.  Just wanted to stop Keppra which was added as an outpatient for chronic pain.  No history of seizure. -We also added Seroquel twice daily and midodrine.  Goals of care -DNR status. -Family is open to the idea of palliative care and hospice care if her clinical progression stops.    Mobility: Uses a walker at baseline.  Reevaluation by PT is needed when mental status improves. Code Status:   Code Status: DNR  Nutritional status: Body mass index is 29.4 kg/m.     Diet Order            DIET SOFT Room service appropriate? Yes; Fluid consistency: Thin  Diet effective now                 DVT prophylaxis: enoxaparin (LOVENOX) injection 40 mg Start: 06/18/20 1000   Antimicrobials:  None Fluid: None Consultants: None Family Communication:  None  Status is: Inpatient  Remains inpatient appropriate because - acute pancreatitis, acute encephalopathy  Dispo: The patient is from: Home              Anticipated d/c is to: Unclear at this time.              Patient currently is not medically stable to d/c.   Difficult to place patient No    Infusions:  . ferumoxytol Stopped (06/19/20 1333)    Scheduled Meds: . sodium chloride   Intravenous Once  . buPROPion  150 mg Oral Daily  . busPIRone  5 mg Oral BID  . chlorhexidine  15 mL Mouth Rinse BID  . cycloSPORINE  1 drop Both Eyes BID  . diclofenac Sodium  4 g Topical  BID  . enoxaparin (LOVENOX) injection  40 mg Subcutaneous Q24H  . levETIRAcetam  250 mg Oral BID  . loteprednol  1 drop Both Eyes BID  . mouth rinse  15 mL Mouth Rinse q12n4p  . melatonin  5 mg Oral QHS  . methadone  10 mg Oral Q6H  . mirtazapine  7.5 mg Oral QHS  . pantoprazole  40 mg Oral QHS  . PARoxetine  10 mg Oral Daily  . QUEtiapine  25 mg Oral BID  . Suvorexant  10 mg Oral QHS    Antimicrobials: Anti-infectives (From admission,  onward)   Start     Dose/Rate Route Frequency Ordered Stop   06/15/20 2330  ciprofloxacin (CIPRO) tablet 250 mg  Status:  Discontinued       Note to Pharmacy: Star date : 06/12/20     250 mg Oral 2 times daily 06/15/20 2318 06/16/20 1851      PRN meds: acetaminophen, diphenhydrAMINE, fluticasone, HYDROmorphone, LORazepam, methocarbamol, naLOXone (NARCAN)  injection, ondansetron, polyethylene glycol, polyvinyl alcohol   Objective: Vitals:   06/22/20 2101 06/23/20 0514  BP: 138/69 128/70  Pulse: (!) 109 (!) 103  Resp: 18 18  Temp: 98.8 F (37.1 C) 98.6 F (37 C)  SpO2: 91% 97%    Intake/Output Summary (Last 24 hours) at 06/23/2020 1300 Last data filed at 06/23/2020 7106 Gross per 24 hour  Intake 610 ml  Output 375 ml  Net 235 ml   Filed Weights   06/21/20 0500 06/22/20 0607 06/23/20 0408  Weight: 84.4 kg 78.8 kg 77.7 kg   Weight change: -1.1 kg Body mass index is 29.4 kg/m.   Physical Exam: General exam: Pleasant elderly Caucasian female.  Patient is not in physical distress Skin: No rashes, lesions or ulcers. HEENT: Atraumatic, normocephalic, no obvious bleeding Lungs: Clear to auscultation bilaterally CVS: Regular rate and rhythm, no murmur GI/Abd soft, improving tenderness in epigastrium, nondistended, bowel sound present CNS: Awake, alert, able to answer simple questions.  Knows she is in the hospital psychiatry: Depressed look Extremities: No pedal edema, no calf tenderness  Data Review: I have personally reviewed the  laboratory data and studies available.  Recent Labs  Lab 06/18/20 0352 06/19/20 0408 06/20/20 0117 06/21/20 0354 06/23/20 0419  WBC 12.7* 12.3* 9.7 8.7 9.8  NEUTROABS 10.5* 9.8* 7.4 6.4 7.2  HGB 7.3* 7.1* 6.5* 7.7* 7.8*  HCT 25.1* 23.6* 21.7* 25.4* 25.7*  MCV 89.0 87.4 87.1 86.7 85.1  PLT 196 234 207 304 367   Recent Labs  Lab 06/19/20 0408 06/20/20 0112 06/20/20 0117 06/21/20 0354 06/22/20 0358 06/23/20 0419  NA 139 139  --  144 143 144  K 3.9 3.5  --  3.4* 3.3* 2.8*  CL 109 107  --  106 107 104  CO2 23 23  --  28 25 29   GLUCOSE 107* 85  --  75 63* 110*  BUN 26* 24*  --  17 17 17   CREATININE 1.21* 0.98  --  0.98 0.94 0.78  CALCIUM 8.9 8.4*  --  8.6* 8.4* 8.6*  MG 2.0  --  1.9 1.9 2.0 2.0  PHOS 2.1*  --  2.8 3.5 3.8 3.7    F/u labs ordered Unresulted Labs (From admission, onward)          Start     Ordered   06/23/20 0500  CBC with Differential/Platelet  Daily,   R     Question:  Specimen collection method  Answer:  Lab=Lab collect   06/22/20 1126   06/20/20 0821  Occult blood card to lab, stool  Once,   R        06/20/20 0820   06/17/20 0500  Magnesium  Daily,   R      06/16/20 0758   06/17/20 0500  Phosphorus  Daily,   R      06/16/20 0758   06/16/20 0759  Lipase, blood  Daily,   R      06/16/20 0758          Signed, Terrilee Croak, MD [facility] 06/23/2020

## 2020-06-23 NOTE — Progress Notes (Signed)
PT Cancellation Note  Patient Details Name: Kelly Curtis MRN: [id number] DOB: Jan 01, 1946   Cancelled Treatment:    Reason Eval/Treat Not Completed: Attempted PT tx sesson-sitter requested that I do not awaken pt at this time. Will check back another day as able.    [facility]  Office: [phone] Pager: [phone]

## 2020-06-23 NOTE — Plan of Care (Signed)

## 2020-06-24 DIAGNOSIS — K219 Gastro-esophageal reflux disease without esophagitis: Secondary | ICD-10-CM

## 2020-06-24 LAB — CBC WITH DIFFERENTIAL/PLATELET
Abs Immature Granulocytes: 0.05 10*3/uL (ref 0.00–0.07)
Basophils Absolute: 0.1 10*3/uL (ref 0.0–0.1)
Basophils Relative: 1 %
Eosinophils Absolute: 0.7 10*3/uL — ABNORMAL HIGH (ref 0.0–0.5)
Eosinophils Relative: 8 %
HCT: 27.4 % — ABNORMAL LOW (ref 36.0–46.0)
Hemoglobin: 8 g/dL — ABNORMAL LOW (ref 12.0–15.0)
Immature Granulocytes: 1 %
Lymphocytes Relative: 13 %
Lymphs Abs: 1.1 10*3/uL (ref 0.7–4.0)
MCH: 26.1 pg (ref 26.0–34.0)
MCHC: 29.2 g/dL — ABNORMAL LOW (ref 30.0–36.0)
MCV: 89.3 fL (ref 80.0–100.0)
Monocytes Absolute: 0.9 10*3/uL (ref 0.1–1.0)
Monocytes Relative: 10 %
Neutro Abs: 6.1 10*3/uL (ref 1.7–7.7)
Neutrophils Relative %: 67 %
Platelets: 370 10*3/uL (ref 150–400)
RBC: 3.07 MIL/uL — ABNORMAL LOW (ref 3.87–5.11)
RDW: 20.3 % — ABNORMAL HIGH (ref 11.5–15.5)
WBC: 8.9 10*3/uL (ref 4.0–10.5)
nRBC: 0 % (ref 0.0–0.2)

## 2020-06-24 LAB — LIPASE, BLOOD: Lipase: 68 U/L — ABNORMAL HIGH (ref 11–51)

## 2020-06-24 LAB — PHOSPHORUS: Phosphorus: 3.8 mg/dL (ref 2.5–4.6)

## 2020-06-24 LAB — MAGNESIUM: Magnesium: 2.1 mg/dL (ref 1.7–2.4)

## 2020-06-24 NOTE — Progress Notes (Signed)
Physical Therapy Treatment Patient Details Name: Kelly Curtis MRN: [id number] DOB: [birth year] Today's Date: 06/24/2020    History of Present Illness 75 y.o. 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  Patient presented to the ED on 06/15/2020 with complaint of significant upper abdominal pain associated with poor oral intake, decreased urine output, nausea.  Pt admitted for severe acute pancreatitis    PT Comments    Pt with improved cognition today and all appropriate conversation and safety measures during session. A & O x4. Pt very excited to be feeling better and doing more today. Was able to walk with RW in hallways within close proximity with gait belt . Tolerated well , pt really wants to DC home, home set up is flat and well equipped and she has her sister and brother in laws help. The sister agrees that they do want to take her back home. At this time recommend HHPT for continued strengthening and helping with balance and fall prevention in the home. Will continue to follow while here on acute care.     Follow Up Recommendations  Home health PT     Equipment Recommendations  None recommended by PT    Recommendations for Other Services       Precautions / Restrictions Precautions Precautions: Fall Precaution Comments: monitor vitals, lower legs severely discolor with dependent position, fell 2 weeks ago    Mobility  Bed Mobility Overal bed mobility: Modified Independent Bed Mobility: Rolling;Supine to Sit;Sit to Supine Rolling: Modified independent (Device/Increase time)   Supine to sit: Modified independent (Device/Increase time) Sit to supine: Modified independent (Device/Increase time)        Transfers Overall transfer level: Needs assistance Equipment used: Rolling walker (2 wheeled) Transfers: Sit to/from  Stand Sit to Stand: Min guard         General transfer comment: min guard due to reports of feeling dizzy from the patient so I was close by and with a gait belt. no LOB noted upon rising.  Ambulation/Gait Ambulation/Gait assistance: Min assist Gait Distance (Feet): 100 Feet Assistive device: Rolling walker (2 wheeled) Gait Pattern/deviations: Step-through pattern     General Gait Details: pt ambulated with RW, did have some weakness i legs noted through some "jumpiness" in LES at times .PT states this happens when she is weak and sometimes when it gets worse she will give out and fall.   Stairs             Wheelchair Mobility    Modified Rankin (Stroke Patients Only)       Balance Overall balance assessment: Needs assistance Sitting-balance support: Feet supported Sitting balance-Leahy Scale: Normal       Standing balance-Leahy Scale: Fair Standing balance comment: used RW and PT used gait to make sure pt was steady                            Cognition Arousal/Alertness: Awake/alert Behavior During Therapy: WFL for tasks assessed/performed Overall Cognitive Status: Within Functional Limits for tasks assessed                                        Exercises      General Comments        Pertinent  Vitals/Pain Pain Assessment: No/denies pain Pain Score: 0-No pain    Home Living                      Prior Function            PT Goals (current goals can now be found in the care plan section) Acute Rehab PT Goals Patient Stated Goal: to go home at discharge PT Goal Formulation: With patient Time For Goal Achievement: 07/02/20 Potential to Achieve Goals: Good Progress towards PT goals: Progressing toward goals    Frequency    Min 3X/week      PT Plan Discharge plan needs to be updated (changed to HHPT at this time)    Co-evaluation              AM-PAC PT "6 Clicks" Mobility   Outcome Measure   Help needed turning from your back to your side while in a flat bed without using bedrails?: None Help needed moving from lying on your back to sitting on the side of a flat bed without using bedrails?: None Help needed moving to and from a bed to a chair (including a wheelchair)?: A Little Help needed standing up from a chair using your arms (e.g., wheelchair or bedside chair)?: A Little Help needed to walk in hospital room?: A Little Help needed climbing 3-5 steps with a railing? : A Little 6 Click Score: 20    End of Session Equipment Utilized During Treatment: Gait belt Activity Tolerance: Patient tolerated treatment well Patient left: in chair;with call bell/phone within reach;with chair alarm set;with family/visitor present Nurse Communication: Mobility status PT Visit Diagnosis: Other abnormalities of gait and mobility (R26.89)     Time: 1215-1300 PT Time Calculation (min) (ACUTE ONLY): 45 min  Charges:  $Gait Training: 8-22 mins $Therapeutic Activity: 8-22 mins                     Yomar Mejorado, PT, MPT Acute Rehabilitation Services Office: [phone] Pager: [phone] 06/24/2020    Clide Dales 06/24/2020, 5:31 PM

## 2020-06-24 NOTE — Progress Notes (Signed)
PROGRESS NOTE    Kelly Curtis  ONG:[id number] DOB: 01/17/1946 DOA: 06/15/2020 PCP: [name], MD    Brief Narrative:  Kelly Curtis is a 75 year old female with past medical history significant for chronic pain disorder, anxiety/depression, chronic diastolic congestive heart failure, history of DVT, history of mitral valve annuloplasty ring, skin cancer x 3 who presented to the ED on 06/15/2020 with complaint of significant upper abdominal pain associated with poor oral intake, decreased urine output and nausea.  Patient was recently seen by her PCP, he started her on ciprofloxacin for possible UTI.  A CT abdomen/pelvis were performed outpatient with findings concerning for pancreatitis localized in the pancreatic head and hence she was referred to the ED for further evaluation.  In the ED, afebrile, HR 101, BP 165/100, oxygenating well on room air.  WBC 6.9, hemoglobin 8.8, sodium 141, potassium 4.0, chloride 102, bicarb 27, glucose 91, BUN 49, creatinine 2.1, lipase 3200.  CT abdomen/pelvis with findings of acute pancreatitis.  Patient was given pain medication, 2 L normal saline bolus, Zofran and hospitalist service was consulted for further evaluation and management.   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  Lipase elevated 3200 on admission.  CT abdomen/pelvis with findings consistent with acute pancreatitis.  Unclear etiology, imaging negative for biliary etiology and no alcohol use reported in the last several years per patient/family.  Patient was given IV pain medication and received IV fluid  hydration. --Lipase 3200>>>68 --IV fluids now discontinued, tolerating soft diet --Continue to encourage increased oral intake  Hypokalemia Potassium 2.8 yesterday, was repleted. Magnesium level normal. --repeat BMP in the am  Acute metabolic encephalopathy: Resolved CT head without contrast 06/22/2020 with mild atrophic changes without acute abnormality.  Unclear etiology, but likely multifactorial given renal failure, and significant chronic pain regimen as well as her underlying anxiety/depression.  Mental status now appears to be back to her normal baseline.  Sinus tachycardia: Resolved Etiology likely secondary to agitation, now resolved.  Not on AV nodal blocking agents at home. TTE with LVEF 60-65% with mild LVH.  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  Patient renal function improved, now resolved and back to baseline following IV fluid hydration. --Cr 2.12>>0.78 --Continue to encourage increased oral intake  Anxiety/depression --Bupropion 150mg  PO daily --Buspar 5mg  PO BID --Paroxetine 10 mg p.o. daily --Seroquel 25 mg p.o. twice daily --Ativan 1 mg IV every 6 hours as needed anxiety  Chronic pain syndrome --Methadone 10 mg PO q6h --Dilaudid 2 mg p.o. every 6 hours as needed severe pain --Voltaren gel twice daily  Insomnia --Suvorexant 10mg  qHS --melatonin 5 mg nightly --Remeron 7.5 mg p.o. nightly  GERD: Continue PPI   DVT prophylaxis: Lovenox   Code Status: DNR Family Communication:   Disposition Plan:  Level of care: Progressive Status is: Inpatient  Remains inpatient appropriate because:Altered mental status, Unsafe d/c plan, IV treatments appropriate due to intensity of illness or inability to take PO and Inpatient level of care appropriate due to severity of illness  Dispo: The patient is from: Home              Anticipated d/c  is to: Home              Patient currently is not medically stable to d/c.   Difficult to place patient No   Consultants:   None  Procedures:   TTE  Antimicrobials:   None   Subjective: Patient seen and examined at bedside, resting comfortably.  Sitter present.  Sitter states that she has progressed back to her normal mental status this morning.  Patient with no significant complaints or concerns at this time.  Tolerating diet and abdominal pain is now resolved.  No other questions or concerns at this time.  Denies headache, no visual changes, no chest pain, palpitations, no shortness of breath, no abdominal pain, no weakness, no fatigue, no paresthesias.  No acute events overnight per nursing staff.  Objective: Vitals:   06/23/20 1344 06/23/20 2145 06/24/20 0424 06/24/20 1306  BP: 126/72 (!) 120/56 138/78 139/74  Pulse: 93 99 (!) 101 88  Resp: 14  19 17   Temp: 97.9 F (36.6 C) 97.9 F (36.6 C) 97.6 F (36.4 C) 97.6 F (36.4 C)  TempSrc: Oral Oral Oral Oral  SpO2: (!) 85% 91% 96% 98%  Weight:      Height:        Intake/Output Summary (Last 24 hours) at 06/24/2020 1511 Last data filed at 06/24/2020 0900 Gross per 24 hour  Intake 40 ml  Output --  Net 40 ml   Filed Weights   06/21/20 0500 06/22/20 0607 06/23/20 0408  Weight: 84.4 kg 78.8 kg 77.7 kg    Examination:  General exam: Appears calm and comfortable  Respiratory system: Clear to auscultation. Respiratory effort normal.  On room air Cardiovascular system: S1 & S2 heard, RRR. No JVD, murmurs, rubs, gallops or clicks. No pedal edema. Gastrointestinal system: Abdomen is nondistended, soft and nontender. No organomegaly or masses felt. Normal bowel sounds heard. Central nervous system: Alert and oriented. No focal neurological deficits. Extremities: Symmetric 5 x 5 power. Skin: No rashes, lesions or ulcers Psychiatry: Judgement and insight appear poor. Mood & affect appropriate.     Data Reviewed: I have  personally reviewed following labs and imaging studies  CBC: Recent Labs  Lab 06/19/20 0408 06/20/20 0117 06/21/20 0354 06/23/20 0419 06/24/20 0428  WBC 12.3* 9.7 8.7 9.8 8.9  NEUTROABS 9.8* 7.4 6.4 7.2 6.1  HGB 7.1* 6.5* 7.7* 7.8* 8.0*  HCT 23.6* 21.7* 25.4* 25.7* 27.4*  MCV 87.4 87.1 86.7 85.1 89.3  PLT 234 207 304 367 295   Basic Metabolic Panel: Recent Labs  Lab 06/19/20 0408 06/20/20 0112 06/20/20 0117 06/21/20 0354 06/22/20 0358 06/23/20 0419 06/24/20 0428  NA 139 139  --  144 143 144  --   K 3.9 3.5  --  3.4* 3.3* 2.8*  --   CL 109 107  --  106 107 104  --   CO2 23 23  --  28 25 29   --   GLUCOSE 107* 85  --  75 63* 110*  --   BUN 26* 24*  --  17 17 17   --   CREATININE 1.21* 0.98  --  0.98 0.94 0.78  --   CALCIUM 8.9 8.4*  --  8.6* 8.4* 8.6*  --   MG 2.0  --  1.9 1.9 2.0 2.0 2.1  PHOS 2.1*  --  2.8 3.5 3.8 3.7 3.8  GFR: Estimated Creatinine Clearance: 64.1 mL/min (by C-G formula based on SCr of 0.78 mg/dL). Liver Function Tests: Recent Labs  Lab 06/18/20 0352 06/19/20 0408  AST 59* 38  ALT 33 27  ALKPHOS 239* 222*  BILITOT 1.9* 1.6*  PROT 5.7* 5.5*  ALBUMIN 3.0* 2.8*   Recent Labs  Lab 06/20/20 0117 06/21/20 0354 06/22/20 0358 06/23/20 0419 06/24/20 0428  LIPASE 91* 102* 85* 88* 68*   Recent Labs  Lab 06/19/20 0408  AMMONIA 20   Coagulation Profile: No results for input(s): INR, PROTIME in the last 168 hours. Cardiac Enzymes: No results for input(s): CKTOTAL, CKMB, CKMBINDEX, TROPONINI in the last 168 hours. BNP (last 3 results) No results for input(s): PROBNP in the last 8760 hours. HbA1C: No results for input(s): HGBA1C in the last 72 hours. CBG: Recent Labs  Lab 06/19/20 0158 06/19/20 0217 06/19/20 0408 06/19/20 0756 06/19/20 2320  GLUCAP 64* 75 100* 82 85   Lipid Profile: No results for input(s): CHOL, HDL, LDLCALC, TRIG, CHOLHDL, LDLDIRECT in the last 72 hours. Thyroid Function Tests: No results for input(s): TSH,  T4TOTAL, FREET4, T3FREE, THYROIDAB in the last 72 hours. Anemia Panel: No results for input(s): VITAMINB12, FOLATE, FERRITIN, TIBC, IRON, RETICCTPCT in the last 72 hours. Sepsis Labs: Recent Labs  Lab 06/20/20 0113 06/20/20 0516  LATICACIDVEN 0.6 0.7    Recent Results (from the past 240 hour(s))  SARS CORONAVIRUS 2 (TAT 6-24 HRS) Nasopharyngeal Nasopharyngeal Swab     Status: None   Collection Time: 06/15/20 10:20 PM   Specimen: Nasopharyngeal Swab  Result Value Ref Range Status   SARS Coronavirus 2 NEGATIVE NEGATIVE Final    Comment: (NOTE) SARS-CoV-2 target nucleic acids are NOT DETECTED.  The SARS-CoV-2 RNA is generally detectable in upper and lower respiratory specimens during the acute phase of infection. Negative results do not preclude SARS-CoV-2 infection, do not rule out co-infections with other pathogens, and should not be used as the sole basis for treatment or other patient management decisions. Negative results must be combined with clinical observations, patient history, and epidemiological information. The expected result is Negative.  Fact Sheet for Patients: SugarRoll.be  Fact Sheet for Healthcare Providers: https://www.woods-mathews.com/  This test is not yet approved or cleared by the [place] FDA and  has been authorized for detection and/or diagnosis of SARS-CoV-2 by FDA under an Emergency Use Authorization (EUA). This EUA will remain  in effect (meaning this test can be used) for the duration of the COVID-19 declaration under Se ction 564(b)(1) of the Act, 21 U.S.C. section 360bbb-3(b)(1), unless the authorization is terminated or revoked sooner.  Performed at [facility], [place] [address], [place], [place] [address]          Radiology Studies: CT HEAD WO CONTRAST  Result Date: 06/22/2020 CLINICAL DATA:  Altered mental status EXAM: CT HEAD WITHOUT CONTRAST TECHNIQUE: Contiguous axial images  were obtained from the base of the skull through the vertex without intravenous contrast. COMPARISON:  06/20/2019 FINDINGS: Brain: No evidence of acute infarction, hemorrhage, hydrocephalus, extra-axial collection or mass lesion/mass effect. Mild atrophic changes are noted and stable. Vascular: No hyperdense vessel or unexpected calcification. Skull: Normal. Negative for fracture or focal lesion. Sinuses/Orbits: No acute finding. Other: None. IMPRESSION: Mild atrophic changes without acute abnormality. Electronically Signed   By: Inez Catalina M.D.   On: 06/22/2020 19:40        Scheduled Meds: . sodium chloride   Intravenous Once  . buPROPion  150 mg Oral Daily  .  busPIRone  5 mg Oral BID  . chlorhexidine  15 mL Mouth Rinse BID  . cycloSPORINE  1 drop Both Eyes BID  . diclofenac Sodium  4 g Topical BID  . enoxaparin (LOVENOX) injection  40 mg Subcutaneous Q24H  . levETIRAcetam  250 mg Oral BID  . loteprednol  1 drop Both Eyes BID  . mouth rinse  15 mL Mouth Rinse q12n4p  . melatonin  5 mg Oral QHS  . methadone  10 mg Oral Q6H  . mirtazapine  7.5 mg Oral QHS  . pantoprazole  40 mg Oral QHS  . PARoxetine  10 mg Oral Daily  . QUEtiapine  25 mg Oral BID  . Suvorexant  10 mg Oral QHS   Continuous Infusions: . ferumoxytol Stopped (06/19/20 1333)     LOS: 9 days    Time spent: 39 minutes spent on chart review, discussion with nursing staff, consultants, updating family and interview/physical exam; more than 50% of that time was spent in counseling and/or coordination of care.    Filemon Breton J [place] DO [facility] Available via Epic secure chat 7am-7pm After these hours, please refer to coverage provider listed on [email or web address] 06/24/2020, 3:11 PM

## 2020-06-25 LAB — BASIC METABOLIC PANEL
Anion gap: 8 (ref 5–15)
BUN: 14 mg/dL (ref 8–23)
CO2: 29 mmol/L (ref 22–32)
Calcium: 8.2 mg/dL — ABNORMAL LOW (ref 8.9–10.3)
Chloride: 101 mmol/L (ref 98–111)
Creatinine, Ser: 1.07 mg/dL — ABNORMAL HIGH (ref 0.44–1.00)
GFR, Estimated: 55 mL/min — ABNORMAL LOW (ref >60)
Glucose, Bld: 97 mg/dL (ref 70–99)
Potassium: 3.5 mmol/L (ref 3.5–5.1)
Sodium: 138 mmol/L (ref 135–145)

## 2020-06-25 LAB — MAGNESIUM: Magnesium: 2 mg/dL (ref 1.7–2.4)

## 2020-06-25 MED ORDER — POTASSIUM CHLORIDE CRYS ER 20 MEQ PO TBCR
40.0000 meq | EXTENDED_RELEASE_TABLET | Freq: Once | ORAL | Status: AC
  Administered 2020-06-25: 40 meq via ORAL
  Filled 2020-06-25: qty 2

## 2020-06-25 NOTE — Care Management Important Message (Signed)
Important Message  Patient Details IM Letter given to the Patient. Name: Kelly Curtis MRN: [id number] Date of Birth: 1945-04-24   Medicare Important Message Given:  Yes     Kerin Salen 06/25/2020, 11:16 AM

## 2020-06-25 NOTE — Discharge Summary (Signed)
Physician Discharge Summary  Adison Reifsteck MGQ:[id number] DOB: 1946-01-12 DOA: 06/15/2020  PCP: [name], MD  Admit date: 06/15/2020 Discharge date: 06/25/2020  Admitted From: Home Disposition: Home  Recommendations for Outpatient Follow-up:  1. Follow up with PCP in 1-2 weeks 2. Recommend BMP 1 week for follow-up hypokalemia  Home Health: PT/RN/SW Equipment/Devices: None  Discharge Condition: Stable CODE STATUS: DNR Diet recommendation: Heart healthy/bland diet  History of present illness:  Essie Lagunes is a 75 year old female with past medical history significant for chronic pain disorder, anxiety/depression, chronic diastolic congestive heart failure, history of DVT, history of mitral valve annuloplasty ring, skin cancer x 3 who presented to the ED on 06/15/2020 with complaint of significant upper abdominal pain associated with poor oral intake, decreased urine output and nausea.  Patient was recently seen by her PCP, he started her on ciprofloxacin for possible UTI.  A CT abdomen/pelvis were performed outpatient with findings concerning for pancreatitis localized in the pancreatic head and hence she was referred to the ED for further evaluation.  In the ED, afebrile, HR 101, BP 165/100, oxygenating well on room air.  WBC 6.9, hemoglobin 8.8, sodium 141, potassium 4.0, chloride 102, bicarb 27, glucose 91, BUN 49, creatinine 2.1, lipase 3200.  CT abdomen/pelvis with findings of acute pancreatitis.  Patient was given pain medication, 2 L normal saline bolus, Zofran and hospitalist service was consulted for further evaluation and management.  Hospital course:  Acute pancreatitis Patient presenting to the ED with progressive abdominal pain associated with nausea, poor oral intake.  Lipase elevated 3200 on admission.  CT abdomen/pelvis with findings consistent with acute pancreatitis.  Unclear etiology, imaging negative for biliary etiology and no alcohol use reported in the last several  years per patient/family.  Patient was given IV pain medication and received IV fluid hydration.  Patient's lipase improved from 3200-68.  Patient's diet was slowly advanced with good toleration.  Recommend continue bland diet for 1 week following discharge and to avoid fatty/fried foods.  Hypokalemia Repleted during hospitalization.  Recommend BMP 1 week.  Acute metabolic encephalopathy: Resolved CT head without contrast 06/22/2020 with mild atrophic changes without acute abnormality. Unclear etiology, but likely multifactorial given renal failure, and significant chronic pain regimen as well as her underlying anxiety/depression in addition to hospital-acquired delirium.  Mental status now appears to be back to her normal baseline.  Sinus tachycardia: Resolved Etiology likely secondary to agitation, now resolved.  Not on AV nodal blocking agents at home. TTE with LVEF 60-65% with mild LVH.  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  Patient renal function improved, now resolved and back to baseline following IV fluid hydration.  Creatinine 1.07 at time of discharge.  Repeat BMP 1 week.  Anxiety/depression Bupropion 119m PO daily, Buspar 544mPO BID, Paroxetine 10 mg p.o. daily.  Outpatient follow-up PCP for further adjustments  Chronic pain syndrome Methadone 10 mg PO q6h, Dilaudid 2 mg p.o. every 6 hours as needed severe pain, Voltaren gel twice daily  Insomnia Suvorexant 1090mHS, melatonin 5 mg nightly, Remeron 7.5 mg p.o. nightly  GERD: Continue PPI   Discharge Diagnoses:  Active Problems:   GERD (gastroesophageal reflux disease)   Chronic pain   Chronic diastolic CHF (congestive heart failure) (HCC)   Iron deficiency anemia    Discharge Instructions  Discharge Instructions    Call MD for:  difficulty breathing, headache or visual disturbances  Complete by: As directed    Call MD for:  extreme fatigue   Complete by: As directed    Call MD for:  persistant dizziness or light-headedness   Complete by: As directed    Call MD for:  persistant nausea and vomiting   Complete by: As directed    Call MD for:  severe uncontrolled pain   Complete by: As directed    Call MD for:  temperature >100.4   Complete by: As directed    Diet - low sodium heart healthy   Complete by: As directed    Increase activity slowly   Complete by: As directed      Allergies as of 06/25/2020      Reactions   Pregabalin    Other reaction(s): Other, Suicidal ideations Suicidal ideations   Demerol  [meperidine Hcl] Nausea And Vomiting   Ferrous Sulfate Nausea And Vomiting   Other reaction(s): upset stomach   Gabapentin Other (See Comments)   Suicidal ideation (03/23/18 - pt currently is getting small amount in pain pump with no reaction)   Hydroxyzine Other (See Comments)   hallucinations   Macrolides And Ketolides Swelling   Other reaction(s): Significant swelling   Midazolam Other (See Comments)   Bradycardia   Trazodone And Nefazodone Other (See Comments)   dizziness   Ambien [zolpidem] Other (See Comments)   unknown   Latex    Other reaction(s): rash   Escitalopram Oxalate Other (See Comments)   confusion   Penicillins Rash   DID THE REACTION INVOLVE: Swelling of the face/tongue/throat, SOB, or low BP? No Sudden or severe rash/hives, skin peeling, or the inside of the mouth or nose? Yes Did it require medical treatment? Yes When did it last happen?75 years old If all above answers are "NO", may proceed with cephalosporin use.   Vancomycin Swelling, Rash      Medication List    STOP taking these medications   ciprofloxacin 250 MG tablet Commonly known as: CIPRO   traMADol 50 MG tablet Commonly known as: Ultram     TAKE these medications   acetaminophen 500 MG tablet Commonly known as: TYLENOL Take 1,000 mg by mouth  every 6 (six) hours as needed for moderate pain.   aspirin EC 81 MG tablet Take 81 mg by mouth daily as needed.   Belsomra 10 MG Tabs Generic drug: Suvorexant Take 10 mg by mouth daily.   buPROPion 150 MG 24 hr tablet Commonly known as: WELLBUTRIN XL Take 150 mg by mouth daily.   busPIRone 5 MG tablet Commonly known as: BUSPAR Take 5 mg by mouth 2 (two) times daily.   calcium carbonate 500 MG chewable tablet Commonly known as: TUMS - dosed in mg elemental calcium Chew 500 mg by mouth daily as needed for indigestion or heartburn.   cetirizine 10 MG tablet Commonly known as: ZYRTEC Take 10 mg by mouth daily.   cycloSPORINE 0.05 % ophthalmic emulsion Commonly known as: RESTASIS Place 1 drop into both eyes 2 (two) times daily.   dimenhyDRINATE 50 MG tablet Commonly known as: DRAMAMINE Take 50 mg by mouth every 8 (eight) hours as needed for nausea or dizziness.   diphenhydrAMINE 25 MG tablet Commonly known as: BENADRYL Take 25 mg by mouth every 6 (six) hours as needed for itching or sleep.   FLINTSTONES PLUS IRON PO Take 1 tablet by mouth daily.   fluticasone 50 MCG/ACT nasal spray Commonly known as: FLONASE Place 1 spray into both nostrils daily as  needed for allergies or rhinitis.   furosemide 20 MG tablet Commonly known as: LASIX Take 20 mg by mouth 2 (two) times daily as needed for fluid.   glycerin Supp Place 1 Chip rectally daily as needed for moderate constipation.   HYDROmorphone 2 MG tablet Commonly known as: DILAUDID Take 2 mg by mouth every 4 (four) hours as needed for severe pain.   levETIRAcetam 250 MG tablet Commonly known as: KEPPRA Take 250 mg by mouth 2 (two) times daily.   Loteprednol Etabonate 0.5 % Emul Place 1 drop into both eyes 2 (two) times daily.   melatonin 5 MG Tabs Take 5 mg by mouth at bedtime.   methadone 10 MG tablet Commonly known as: DOLOPHINE Take 10 mg by mouth every 6 (six) hours.   midodrine 10 MG tablet Commonly  known as: PROAMATINE Take 10 mg by mouth 3 (three) times daily.   mirtazapine 15 MG tablet Commonly known as: REMERON Take 7.5 mg by mouth at bedtime.   naloxone 4 MG/0.1ML Liqd nasal spray kit Commonly known as: NARCAN Place 1 spray into the nose once as needed (opiod overdose).   naproxen 500 MG tablet Commonly known as: NAPROSYN Take 500 mg by mouth 2 (two) times daily.   omeprazole 20 MG capsule Commonly known as: PRILOSEC Take 20 mg by mouth daily.   PARoxetine 10 MG tablet Commonly known as: PAXIL Take 10 mg by mouth daily.   polyethylene glycol 17 g packet Commonly known as: MIRALAX / GLYCOLAX Take 17 g by mouth daily.   polyvinyl alcohol 1.4 % ophthalmic solution Commonly known as: LIQUIFILM TEARS Place 1 drop into both eyes 3 (three) times daily as needed for dry eyes.   PREPARATION H EX Apply 1 application topically daily.   tiZANidine 4 MG tablet Commonly known as: ZANAFLEX Take 4 mg by mouth 3 (three) times daily as needed for muscle spasms.       Follow-up Information    Stoneking, Hal, MD. Schedule an appointment as soon as possible for a visit in 1 week(s).   Specialty: Internal Medicine Contact information: [address] [place] [address] [phone]              Allergies  Allergen Reactions  . Pregabalin     Other reaction(s): Other, Suicidal ideations Suicidal ideations   . Demerol  [Meperidine Hcl] Nausea And Vomiting  . Ferrous Sulfate Nausea And Vomiting    Other reaction(s): upset stomach  . Gabapentin Other (See Comments)    Suicidal ideation (03/23/18 - pt currently is getting small amount in pain pump with no reaction)  . Hydroxyzine Other (See Comments)    hallucinations  . Macrolides And Ketolides Swelling    Other reaction(s): Significant swelling  . Midazolam Other (See Comments)    Bradycardia  . Trazodone And Nefazodone Other (See Comments)    dizziness  . Ambien [Zolpidem] Other (See  Comments)    unknown  . Latex     Other reaction(s): rash  . Escitalopram Oxalate Other (See Comments)    confusion  . Penicillins Rash    DID THE REACTION INVOLVE: Swelling of the face/tongue/throat, SOB, or low BP? No Sudden or severe rash/hives, skin peeling, or the inside of the mouth or nose? Yes Did it require medical treatment? Yes When did it last happen?75 years old If all above answers are "NO", may proceed with cephalosporin use.  . Vancomycin Swelling and Rash    Consultations:  None  Procedures/Studies: CT ABDOMEN PELVIS WO CONTRAST  Result Date: 06/15/2020 CLINICAL DATA:  Lower abdominal pain for the past 2 days. EXAM: CT ABDOMEN AND PELVIS WITHOUT CONTRAST TECHNIQUE: Multidetector CT imaging of the abdomen and pelvis was performed following the standard protocol without IV contrast. Creatinine was obtained on site at [facility] [place] at [address] Results: Creatinine 1.9 mg/dL. COMPARISON:  CT abdomen pelvis dated February 06, 2020. FINDINGS: Lower chest: No acute abnormality. Atelectasis at the left lung base, predominantly within the lingula. Hepatobiliary: No focal liver abnormality is seen. Status post cholecystectomy. No biliary dilatation. Pancreas: Peripancreatic inflammatory changes predominantly involving the pancreatic head. No ductal dilatation. Spleen: Normal in size without focal abnormality. Adrenals/Urinary Tract: Adrenal glands are unremarkable. Mild bilateral renal atrophy. No renal calculi or hydronephrosis. The bladder is unremarkable for the degree of distention. Stomach/Bowel: Stomach is within normal limits. Diminutive or absent appendix. Reactive wall thickening of the second portion of the duodenum. No obstruction. Left-sided colonic diverticulosis. Vascular/Lymphatic: Aortic atherosclerosis. No enlarged abdominal or pelvic lymph nodes. Reproductive: Status post hysterectomy. No adnexal masses. Other: New trace free fluid in the pelvis.   No pneumoperitoneum. Musculoskeletal: No acute or significant osseous findings. 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 IMPRESSION: 1. Acute pancreatitis. 2. Aortic Atherosclerosis (ICD10-I70.0). Electronically Signed   By: Titus Dubin M.D.   On: 06/15/2020 15:10   CT HEAD WO CONTRAST  Result Date: 06/22/2020 CLINICAL DATA:  Altered mental status EXAM: CT HEAD WITHOUT CONTRAST TECHNIQUE: Contiguous axial images were obtained from the base of the skull through the vertex without intravenous contrast. COMPARISON:  06/20/2019 FINDINGS: Brain: No evidence of acute infarction, hemorrhage, hydrocephalus, extra-axial collection or mass lesion/mass effect. Mild atrophic changes are noted and stable. Vascular: No hyperdense vessel or unexpected calcification. Skull: Normal. Negative for fracture or focal lesion. Sinuses/Orbits: No acute finding. Other: None. IMPRESSION: Mild atrophic changes without acute abnormality. Electronically Signed   By: Inez Catalina M.D.   On: 06/22/2020 19:40   DG CHEST PORT 1 VIEW  Result Date: 06/20/2020 CLINICAL DATA:  Dyspnea EXAM: PORTABLE CHEST 1 VIEW COMPARISON:  06/17/2020 FINDINGS: Moderate right and small left pleural effusions. Associated lower lobe atelectasis. Pulmonary vascular congestion without frank interstitial edema. No pneumothorax. The heart is normal in size.  Prosthetic valve. Right shoulder arthroplasty. IMPRESSION: Moderate right and small left pleural effusions. Associated lower lobe atelectasis. Electronically Signed   By: Julian Hy M.D.   On: 06/20/2020 10:48   DG CHEST PORT 1 VIEW  Result Date: 06/17/2020 CLINICAL DATA:  Shortness of breath EXAM: PORTABLE CHEST 1 VIEW COMPARISON:  June 13, 2018 FINDINGS: There is airspace opacity throughout the right mid and lower lung regions as well as in the left base and to a lesser extent right upper lobe. There are small pleural effusions bilaterally. Heart is upper normal  in size with pulmonary vascularity normal. Patient is status post mitral valve replacement. Thoracic stimulator lead tips in lower thoracic region. Total shoulder replacement on the right. There is aortic atherosclerosis. No appreciable adenopathy. IMPRESSION: Multifocal airspace opacity, considerably more on the right than the left. Suspect multifocal pneumonia, although a degree of pulmonary edema could present in this manner. There are small pleural effusions bilaterally. Heart upper normal in size. Status post mitral valve replacement. Aortic Atherosclerosis (ICD10-I70.0). Electronically Signed   By: Lowella Grip III M.D.   On: 06/17/2020 15:54   ECHOCARDIOGRAM COMPLETE  Result Date: 06/17/2020    ECHOCARDIOGRAM REPORT  Patient Name:   DELIANA AVALOS Date of Exam: 06/17/2020 Medical Rec #:  [id number]   Height:       64.0 in Accession #:    [id number]  Weight:       175.7 lb Date of Birth:  02-04-1946    BSA:          1.852 m Patient Age:    63 years    BP:           133/65 mmHg Patient Gender: F           HR:           106 bpm. Exam Location:  Inpatient Procedure: 2D Echo, Cardiac Doppler and Color Doppler Indications:    O53.66 Chronic systolic (congestive) heart failure  History:        Patient has prior history of Echocardiogram examinations, most                 recent 03/24/2018. Mitral Valve Disease. Cancer. CKD. GERD.                  Mitral Valve: prosthetic annuloplasty ring valve is present in                 the mitral position. Procedure Date: 2000.  Sonographer:    Jonelle Sidle Dance Referring Phys: [id number] [place]  1. Left ventricular ejection fraction, by estimation, is 60 to 65%. The left ventricle has normal function. The left ventricle has no regional wall motion abnormalities. There is mild left ventricular hypertrophy. Left ventricular diastolic parameters are indeterminate.  2. Right ventricular systolic function is normal. The right ventricular size is normal. There is  normal pulmonary artery systolic pressure. The estimated right ventricular systolic pressure is 25.9 mmHg.  3. Left atrial size was moderately dilated.  4. Right atrial size was mildly dilated.  5. There is a prosthetic annuloplasty ring present in the mitral position. Trivial mitral valve regurgitation. Moderate to severe mitral stenosis. Mean gradient 11 mmHg at HR 101 bpm, which is severe range, but MVA 1.3 cm^2 by continuity equation suggests moderate mitral stenosis  6. The aortic valve is tricuspid. Aortic valve regurgitation is not visualized. No aortic stenosis is present.  7. The inferior vena cava is normal in size with greater than 50% respiratory variability, suggesting right atrial pressure of 3 mmHg. FINDINGS  Left Ventricle: Left ventricular ejection fraction, by estimation, is 60 to 65%. The left ventricle has normal function. The left ventricle has no regional wall motion abnormalities. The left ventricular internal cavity size was small. There is mild left ventricular hypertrophy. Left ventricular diastolic parameters are indeterminate. Right Ventricle: The right ventricular size is normal. No increase in right ventricular wall thickness. Right ventricular systolic function is normal. There is normal pulmonary artery systolic pressure. The tricuspid regurgitant velocity is 2.63 m/s, and  with an assumed right atrial pressure of 3 mmHg, the estimated right ventricular systolic pressure is 56.3 mmHg. Left Atrium: Left atrial size was moderately dilated. Right Atrium: Right atrial size was mildly dilated. Pericardium: Trivial pericardial effusion is present. Mitral Valve: The mitral valve has been repaired/replaced. Trivial mitral valve regurgitation. There is a prosthetic annuloplasty ring present in the mitral position. Procedure Date: 2000. Moderate to severe mitral valve stenosis. Tricuspid Valve: The tricuspid valve is normal in structure. Tricuspid valve regurgitation is mild. Aortic Valve: The  aortic valve is tricuspid. Aortic valve regurgitation is not visualized. No aortic stenosis is present. Pulmonic  Valve: The pulmonic valve was not well visualized. Pulmonic valve regurgitation is not visualized. Aorta: The aortic root and ascending aorta are structurally normal, with no evidence of dilitation. Venous: The inferior vena cava is normal in size with greater than 50% respiratory variability, suggesting right atrial pressure of 3 mmHg. IAS/Shunts: The interatrial septum was not well visualized.  LEFT VENTRICLE PLAX 2D LVIDd:         3.10 cm  Diastology LVIDs:         2.20 cm  LV e' lateral:   9.79 cm/s LV PW:         1.30 cm  LV E/e' lateral: 21.3 LV IVS:        1.20 cm LVOT diam:     1.90 cm LV SV:         66 LV SV Index:   36 LVOT Area:     2.84 cm  RIGHT VENTRICLE             IVC RV Basal diam:  3.40 cm     IVC diam: 1.90 cm RV Mid diam:    2.50 cm RV S prime:     11.20 cm/s TAPSE (M-mode): 1.8 cm LEFT ATRIUM             Index       RIGHT ATRIUM           Index LA diam:        4.80 cm 2.59 cm/m  RA Area:     17.80 cm LA Vol (A2C):   89.1 ml 48.12 ml/m RA Volume:   53.10 ml  28.68 ml/m LA Vol (A4C):   87.2 ml 47.09 ml/m LA Biplane Vol: 89.7 ml 48.44 ml/m  AORTIC VALVE LVOT Vmax:   128.00 cm/s LVOT Vmean:  89.600 cm/s LVOT VTI:    0.232 m  AORTA Ao Root diam: 3.30 cm Ao Asc diam:  3.10 cm MITRAL VALVE                TRICUSPID VALVE MV Area (PHT): 2.91 cm     TR Peak grad:   27.7 mmHg MV Decel Time: 261 msec     TR Vmax:        263.00 cm/s MV E velocity: 208.50 cm/s                             SHUNTS                             Systemic VTI:  0.23 m                             Systemic Diam: 1.90 cm Oswaldo Milian MD Electronically signed by Oswaldo Milian MD Signature Date/Time: 06/17/2020/10:30:53 AM    Final       Subjective: Patient seen and examined bedside, resting comfortably.  Sleeping but easily arousable.  No complaints this morning ready for discharge home.  Updated  patient's Sister [name] via telephone.  Patient denies headache, no visual changes, no chest pain, palpitations, no shortness of breath, no abdominal pain, no weakness, no fatigue, no paresthesias.  No acute events overnight per nursing staff.  Discharge Exam: Vitals:   06/24/20 2124 06/25/20 0555  BP: (!) 128/58 104/68  Pulse: (!) 110 90  Resp: 16 18  Temp: 99.1 F (37.3  C) 97.6 F (36.4 C)  SpO2: 90% 91%   Vitals:   06/24/20 1725 06/24/20 2124 06/25/20 0500 06/25/20 0555  BP:  (!) 128/58  104/68  Pulse:  (!) 110  90  Resp:  16  18  Temp:  99.1 F (37.3 C)  97.6 F (36.4 C)  TempSrc:  Oral    SpO2: 96% 90%  91%  Weight:   78.9 kg   Height:        General: Pt is alert, awake, not in acute distress Cardiovascular: RRR, S1/S2 +, no rubs, no gallops Respiratory: CTA bilaterally, no wheezing, no rhonchi, on room air Abdominal: Soft, NT, ND, bowel sounds + Extremities: no edema, no cyanosis    The results of significant diagnostics from this hospitalization (including imaging, microbiology, ancillary and laboratory) are listed below for reference.     Microbiology: Recent Results (from the past 240 hour(s))  SARS CORONAVIRUS 2 (TAT 6-24 HRS) Nasopharyngeal Nasopharyngeal Swab     Status: None   Collection Time: 06/15/20 10:20 PM   Specimen: Nasopharyngeal Swab  Result Value Ref Range Status   SARS Coronavirus 2 NEGATIVE NEGATIVE Final    Comment: (NOTE) SARS-CoV-2 target nucleic acids are NOT DETECTED.  The SARS-CoV-2 RNA is generally detectable in upper and lower respiratory specimens during the acute phase of infection. Negative results do not preclude SARS-CoV-2 infection, do not rule out co-infections with other pathogens, and should not be used as the sole basis for treatment or other patient management decisions. Negative results must be combined with clinical observations, patient history, and epidemiological information. The expected result is  Negative.  Fact Sheet for Patients: SugarRoll.be  Fact Sheet for Healthcare Providers: https://www.woods-mathews.com/  This test is not yet approved or cleared by the [place] FDA and  has been authorized for detection and/or diagnosis of SARS-CoV-2 by FDA under an Emergency Use Authorization (EUA). This EUA will remain  in effect (meaning this test can be used) for the duration of the COVID-19 declaration under Se ction 564(b)(1) of the Act, 21 U.S.C. section 360bbb-3(b)(1), unless the authorization is terminated or revoked sooner.  Performed at [facility], [place] [address] [place], [place] [address]      Labs: BNP (last 3 results) No results for input(s): BNP in the last 8760 hours. Basic Metabolic Panel: Recent Labs  Lab 06/20/20 0112 06/20/20 0117 06/21/20 0354 06/22/20 0358 06/23/20 0419 06/24/20 0428 06/25/20 0353  NA 139  --  144 143 144  --  138  K 3.5  --  3.4* 3.3* 2.8*  --  3.5  CL 107  --  106 107 104  --  101  CO2 23  --  _0 --  29  GLUCOSE 85  --  75 63* 110*  --  97  BUN 24*  --  _1 --  14  CREATININE 0.98  --  0.98 0.94 0.78  --  1.07*  CALCIUM 8.4*  --  8.6* 8.4* 8.6*  --  8.2*  MG  --  1.9 1.9 2.0 2.0 2.1 2.0  PHOS  --  2.8 3.5 3.8 3.7 3.8  --    Liver Function Tests: Recent Labs  Lab 06/19/20 0408  AST 38  ALT 27  ALKPHOS 222*  BILITOT 1.6*  PROT 5.5*  ALBUMIN 2.8*   Recent Labs  Lab 06/20/20 0117 06/21/20 0354 06/22/20 0358 06/23/20 0419 06/24/20 0428  LIPASE 91* 102* 85* 88* 68*  Recent Labs  Lab 06/19/20 0408  AMMONIA 20   CBC: Recent Labs  Lab 06/19/20 0408 06/20/20 0117 06/21/20 0354 06/23/20 0419 06/24/20 0428  WBC 12.3* 9.7 8.7 9.8 8.9  NEUTROABS 9.8* 7.4 6.4 7.2 6.1  HGB 7.1* 6.5* 7.7* 7.8* 8.0*  HCT 23.6* 21.7* 25.4* 25.7* 27.4*  MCV 87.4 87.1 86.7 85.1 89.3  PLT 234 207 304 367 370   Cardiac Enzymes: No results for input(s): CKTOTAL,  CKMB, CKMBINDEX, TROPONINI in the last 168 hours. BNP: Invalid input(s): POCBNP CBG: Recent Labs  Lab 06/19/20 0158 06/19/20 0217 06/19/20 0408 06/19/20 0756 06/19/20 2320  GLUCAP 64* 75 100* 82 85   D-Dimer No results for input(s): DDIMER in the last 72 hours. Hgb A1c No results for input(s): HGBA1C in the last 72 hours. Lipid Profile No results for input(s): CHOL, HDL, LDLCALC, TRIG, CHOLHDL, LDLDIRECT in the last 72 hours. Thyroid function studies No results for input(s): TSH, T4TOTAL, T3FREE, THYROIDAB in the last 72 hours.  Invalid input(s): FREET3 Anemia work up No results for input(s): VITAMINB12, FOLATE, FERRITIN, TIBC, IRON, RETICCTPCT in the last 72 hours. Urinalysis    Component Value Date/Time   COLORURINE AMBER (A) 06/20/2020 1150   APPEARANCEUR HAZY (A) 06/20/2020 1150   LABSPEC 1.019 06/20/2020 1150   PHURINE 5.0 06/20/2020 1150   GLUCOSEU NEGATIVE 06/20/2020 1150   HGBUR NEGATIVE 06/20/2020 1150   BILIRUBINUR NEGATIVE 06/20/2020 1150   KETONESUR 20 (A) 06/20/2020 1150   PROTEINUR 30 (A) 06/20/2020 1150   NITRITE NEGATIVE 06/20/2020 1150   LEUKOCYTESUR NEGATIVE 06/20/2020 1150   Sepsis Labs Invalid input(s): PROCALCITONIN,  WBC,  LACTICIDVEN Microbiology Recent Results (from the past 240 hour(s))  SARS CORONAVIRUS 2 (TAT 6-24 HRS) Nasopharyngeal Nasopharyngeal Swab     Status: None   Collection Time: 06/15/20 10:20 PM   Specimen: Nasopharyngeal Swab  Result Value Ref Range Status   SARS Coronavirus 2 NEGATIVE NEGATIVE Final    Comment: (NOTE) SARS-CoV-2 target nucleic acids are NOT DETECTED.  The SARS-CoV-2 RNA is generally detectable in upper and lower respiratory specimens during the acute phase of infection. Negative results do not preclude SARS-CoV-2 infection, do not rule out co-infections with other pathogens, and should not be used as the sole basis for treatment or other patient management decisions. Negative results must be combined  with clinical observations, patient history, and epidemiological information. The expected result is Negative.  Fact Sheet for Patients: SugarRoll.be  Fact Sheet for Healthcare Providers: https://www.woods-mathews.com/  This test is not yet approved or cleared by the [place] FDA and  has been authorized for detection and/or diagnosis of SARS-CoV-2 by FDA under an Emergency Use Authorization (EUA). This EUA will remain  in effect (meaning this test can be used) for the duration of the COVID-19 declaration under Se ction 564(b)(1) of the Act, 21 U.S.C. section 360bbb-3(b)(1), unless the authorization is terminated or revoked sooner.  Performed at [facility], [place] [address], [place], [place] [address]      Time coordinating discharge: Over 30 minutes  SIGNED:   Tanaia Hawkey J [place] DO  [facility] 06/25/2020, 10:13 AM

## 2020-06-25 NOTE — Plan of Care (Signed)

## 2020-06-25 NOTE — Discharge Instructions (Signed)
Acute Pancreatitis  The pancreas is a gland that is located behind the stomach on the left side of the abdomen. It produces enzymes that help to digest food. The pancreas also releases the hormones glucagon and insulin, which help to regulate blood sugar. Acute pancreatitis happens when inflammation of the pancreas suddenly occurs and the pancreas becomes irritated and swollen. Most acute attacks last a few days and cause serious problems. Some people become dehydrated and develop low blood pressure. In severe cases, bleeding in the abdomen can lead to shock and can be life-threatening. The lungs, heart, and kidneys may fail. What are the causes? This condition may be caused by:  Alcohol abuse.  Drug abuse.  Gallstones or other conditions that can block the tube that drains the pancreas (pancreatic duct).  A tumor in the pancreas. Other causes include:  Certain medicines.  Exposure to certain chemicals.  Diabetes.  An infection in the pancreas.  Damage caused by an accident (trauma).  The poison (venom) from a scorpion bite.  Abdominal surgery.  Autoimmune pancreatitis. This is when the body's disease-fighting (immune) system attacks the pancreas.  Genes that are passed from parent to child (inherited). In some cases, the cause of this condition is not known. What are the signs or symptoms? Symptoms of this condition include:  Pain in the upper abdomen that may radiate to the back. Pain may be severe.  Tenderness and swelling of the abdomen.  Nausea and vomiting.  Fever. How is this diagnosed? This condition may be diagnosed based on:  A physical exam.  Blood tests.  Imaging tests, such as X-rays, CT or MRI scans, or an ultrasound of the abdomen. How is this treated? Treatment for this condition usually requires a stay in the hospital. Treatment for this condition may include:  Pain medicine.  Fluid replacement through an IV.  Placing a tube in the stomach  to remove stomach contents and to control vomiting (NG tube, or nasogastric tube).  Not eating for 3-4 days. This gives the pancreas a rest, because enzymes are not being produced that can cause further damage.  Antibiotic medicines, if your condition is caused by an infection.  Treating any underlying conditions that may be the cause.  Steroid medicines, if your condition is caused by your immune system attacking your body's own tissues (autoimmune disease).  Surgery on the pancreas or gallbladder. Follow these instructions at home: Eating and drinking  Follow instructions from your health care provider about diet. This may involve avoiding alcohol and decreasing the amount of fat in your diet.  Eat smaller, more frequent meals. This reduces the amount of digestive fluids that the pancreas produces.  Drink enough fluid to keep your urine pale yellow.  Do not drink alcohol if it caused your condition.   General instructions  Take over-the-counter and prescription medicines only as told by your health care provider.  Do not drive or use heavy machinery while taking prescription pain medicine.  Ask your health care provider if the medicine prescribed to you can cause constipation. You may need to take steps to prevent or treat constipation, such as: ? Take an over-the-counter or prescription medicine for constipation. ? Eat foods that are high in fiber such as whole grains and beans. ? Limit foods that are high in fat and processed sugars, such as fried or sweet foods.  Do not use any products that contain nicotine or tobacco, such as cigarettes, e-cigarettes, and chewing tobacco. If you need help quitting, ask  your health care provider.  Get plenty of rest.  If directed, check your blood sugar at home as told by your health care provider.  Keep all follow-up visits as told by your health care provider. This is important. Contact a health care provider if you:  Do not recover  as quickly as expected.  Develop new or worsening symptoms.  Have persistent pain, weakness, or nausea.  Recover and then have another episode of pain.  Have a fever. Get help right away if:  You cannot eat or keep fluids down.  Your pain becomes severe.  Your skin or the white part of your eyes turns yellow (jaundice).  You have sudden swelling in your abdomen.  You vomit.  You feel dizzy or you faint.  Your blood sugar is high (over 300 mg/dL). Summary  Acute pancreatitis happens when inflammation of the pancreas suddenly occurs and the pancreas becomes irritated and swollen.  This condition is typically caused by alcohol abuse, drug abuse, or gallstones.  Treatment for this condition usually requires a stay in the hospital. This information is not intended to replace advice given to you by your health care provider. Make sure you discuss any questions you have with your health care provider. Document Revised: 01/01/2018 Document Reviewed: 09/18/2017 Elsevier Patient Education  2021 [place] Diet A bland diet consists of foods that are often soft and do not have a lot of fat, fiber, or extra seasonings. Foods without fat, fiber, or seasoning are easier for the body to digest. They are also less likely to irritate your mouth, throat, stomach, and other parts of your digestive system. A bland diet is sometimes called a BRAT diet. What is my plan? Your health care provider or food and nutrition specialist (dietitian) may recommend specific changes to your diet to prevent symptoms or to treat your symptoms. These changes may include:  Eating small meals often.  Cooking food until it is soft enough to chew easily.  Chewing your food well.  Drinking fluids slowly.  Not eating foods that are very spicy, sour, or fatty.  Not eating citrus fruits, such as oranges and grapefruit. What do I need to know about this diet?  Eat a variety of foods from the bland  diet food list.  Do not follow a bland diet longer than needed.  Ask your health care provider whether you should take vitamins or supplements. What foods can I eat? Grains Hot cereals, such as cream of wheat. Rice. Bread, crackers, or tortillas made from refined white flour.   Vegetables Canned or cooked vegetables. Mashed or boiled potatoes. Fruits Bananas. Applesauce. Other types of cooked or canned fruit with the skin and seeds removed, such as canned peaches or pears.   Meats and other proteins Scrambled eggs. Creamy peanut butter or other nut butters. Lean, well-cooked meats, such as chicken or fish. Tofu. Soups or broths.   Dairy Low-fat dairy products, such as milk, cottage cheese, or yogurt. Beverages Water. Herbal tea. Apple juice.   Fats and oils Mild salad dressings. Canola or olive oil. Sweets and desserts Pudding. Custard. Fruit gelatin. Ice cream. The items listed above may not be a complete list of recommended foods and beverages. Contact a dietitian for more options. What foods are not recommended? Grains Whole grain breads and cereals. Vegetables Raw vegetables. Fruits Raw fruits, especially citrus, berries, or dried fruits. Dairy Whole fat dairy foods. Beverages Caffeinated drinks. Alcohol. Seasonings and condiments Strongly flavored seasonings or condiments. Hot  sauce. Salsa. Other foods Spicy foods. Fried foods. Sour foods, such as pickled or fermented foods. Foods with high sugar content. Foods high in fiber. The items listed above may not be a complete list of foods and beverages to avoid. Contact a dietitian for more information. Summary  A bland diet consists of foods that are often soft and do not have a lot of fat, fiber, or extra seasonings.  Foods without fat, fiber, or seasoning are easier for the body to digest.  Check with your health care provider to see how long you should follow this diet plan. It is not meant to be followed for long  periods. This information is not intended to replace advice given to you by your health care provider. Make sure you discuss any questions you have with your health care provider. Document Revised: 04/12/2017 Document Reviewed: 04/12/2017 Elsevier Patient Education  2021 [employer].

## 2020-06-30 DIAGNOSIS — F32A Depression, unspecified: Secondary | ICD-10-CM | POA: Diagnosis not present

## 2020-06-30 DIAGNOSIS — H832X3 Labyrinthine dysfunction, bilateral: Secondary | ICD-10-CM | POA: Diagnosis not present

## 2020-06-30 DIAGNOSIS — Z86718 Personal history of other venous thrombosis and embolism: Secondary | ICD-10-CM | POA: Diagnosis not present

## 2020-06-30 DIAGNOSIS — N182 Chronic kidney disease, stage 2 (mild): Secondary | ICD-10-CM | POA: Diagnosis not present

## 2020-06-30 DIAGNOSIS — H903 Sensorineural hearing loss, bilateral: Secondary | ICD-10-CM | POA: Diagnosis not present

## 2020-06-30 DIAGNOSIS — K8500 Idiopathic acute pancreatitis without necrosis or infection: Secondary | ICD-10-CM | POA: Diagnosis not present

## 2020-06-30 DIAGNOSIS — N179 Acute kidney failure, unspecified: Secondary | ICD-10-CM | POA: Diagnosis not present

## 2020-06-30 DIAGNOSIS — F419 Anxiety disorder, unspecified: Secondary | ICD-10-CM | POA: Diagnosis not present

## 2020-06-30 DIAGNOSIS — I959 Hypotension, unspecified: Secondary | ICD-10-CM | POA: Diagnosis not present

## 2020-06-30 DIAGNOSIS — D509 Iron deficiency anemia, unspecified: Secondary | ICD-10-CM | POA: Diagnosis not present

## 2020-06-30 DIAGNOSIS — I5032 Chronic diastolic (congestive) heart failure: Secondary | ICD-10-CM | POA: Diagnosis not present

## 2020-06-30 DIAGNOSIS — K219 Gastro-esophageal reflux disease without esophagitis: Secondary | ICD-10-CM | POA: Diagnosis not present

## 2020-06-30 DIAGNOSIS — G4700 Insomnia, unspecified: Secondary | ICD-10-CM | POA: Diagnosis not present

## 2020-06-30 DIAGNOSIS — R42 Dizziness and giddiness: Secondary | ICD-10-CM | POA: Diagnosis not present

## 2020-06-30 DIAGNOSIS — H838X3 Other specified diseases of inner ear, bilateral: Secondary | ICD-10-CM | POA: Diagnosis not present

## 2020-06-30 DIAGNOSIS — G894 Chronic pain syndrome: Secondary | ICD-10-CM | POA: Diagnosis not present

## 2020-07-01 DIAGNOSIS — Z5181 Encounter for therapeutic drug level monitoring: Secondary | ICD-10-CM | POA: Diagnosis not present

## 2020-07-01 DIAGNOSIS — Z79899 Other long term (current) drug therapy: Secondary | ICD-10-CM | POA: Diagnosis not present

## 2020-07-02 DIAGNOSIS — G894 Chronic pain syndrome: Secondary | ICD-10-CM | POA: Diagnosis not present

## 2020-07-02 DIAGNOSIS — I959 Hypotension, unspecified: Secondary | ICD-10-CM | POA: Diagnosis not present

## 2020-07-02 DIAGNOSIS — N179 Acute kidney failure, unspecified: Secondary | ICD-10-CM | POA: Diagnosis not present

## 2020-07-02 DIAGNOSIS — N182 Chronic kidney disease, stage 2 (mild): Secondary | ICD-10-CM | POA: Diagnosis not present

## 2020-07-02 DIAGNOSIS — K8500 Idiopathic acute pancreatitis without necrosis or infection: Secondary | ICD-10-CM | POA: Diagnosis not present

## 2020-07-02 DIAGNOSIS — I5032 Chronic diastolic (congestive) heart failure: Secondary | ICD-10-CM | POA: Diagnosis not present

## 2020-07-03 DIAGNOSIS — E876 Hypokalemia: Secondary | ICD-10-CM | POA: Diagnosis not present

## 2020-07-03 DIAGNOSIS — R109 Unspecified abdominal pain: Secondary | ICD-10-CM | POA: Diagnosis not present

## 2020-07-03 DIAGNOSIS — K859 Acute pancreatitis without necrosis or infection, unspecified: Secondary | ICD-10-CM | POA: Diagnosis not present

## 2020-07-09 DIAGNOSIS — R42 Dizziness and giddiness: Secondary | ICD-10-CM | POA: Diagnosis not present

## 2020-07-14 DIAGNOSIS — N1831 Chronic kidney disease, stage 3a: Secondary | ICD-10-CM | POA: Diagnosis not present

## 2020-07-14 DIAGNOSIS — I129 Hypertensive chronic kidney disease with stage 1 through stage 4 chronic kidney disease, or unspecified chronic kidney disease: Secondary | ICD-10-CM | POA: Diagnosis not present

## 2020-07-14 DIAGNOSIS — H04123 Dry eye syndrome of bilateral lacrimal glands: Secondary | ICD-10-CM | POA: Diagnosis not present

## 2020-07-14 DIAGNOSIS — H18413 Arcus senilis, bilateral: Secondary | ICD-10-CM | POA: Diagnosis not present

## 2020-07-14 DIAGNOSIS — G4700 Insomnia, unspecified: Secondary | ICD-10-CM | POA: Diagnosis not present

## 2020-07-14 DIAGNOSIS — H26492 Other secondary cataract, left eye: Secondary | ICD-10-CM | POA: Diagnosis not present

## 2020-07-14 DIAGNOSIS — Z961 Presence of intraocular lens: Secondary | ICD-10-CM | POA: Diagnosis not present

## 2020-07-14 DIAGNOSIS — F325 Major depressive disorder, single episode, in full remission: Secondary | ICD-10-CM | POA: Diagnosis not present

## 2020-07-14 DIAGNOSIS — H26493 Other secondary cataract, bilateral: Secondary | ICD-10-CM | POA: Diagnosis not present

## 2020-07-14 DIAGNOSIS — D508 Other iron deficiency anemias: Secondary | ICD-10-CM | POA: Diagnosis not present

## 2020-07-17 DIAGNOSIS — K8500 Idiopathic acute pancreatitis without necrosis or infection: Secondary | ICD-10-CM | POA: Diagnosis not present

## 2020-07-17 DIAGNOSIS — N1831 Chronic kidney disease, stage 3a: Secondary | ICD-10-CM | POA: Diagnosis not present

## 2020-07-17 DIAGNOSIS — N179 Acute kidney failure, unspecified: Secondary | ICD-10-CM | POA: Diagnosis not present

## 2020-07-17 DIAGNOSIS — I5032 Chronic diastolic (congestive) heart failure: Secondary | ICD-10-CM | POA: Diagnosis not present

## 2020-07-17 DIAGNOSIS — G894 Chronic pain syndrome: Secondary | ICD-10-CM | POA: Diagnosis not present

## 2020-07-17 DIAGNOSIS — R413 Other amnesia: Secondary | ICD-10-CM | POA: Diagnosis not present

## 2020-07-17 DIAGNOSIS — I129 Hypertensive chronic kidney disease with stage 1 through stage 4 chronic kidney disease, or unspecified chronic kidney disease: Secondary | ICD-10-CM | POA: Diagnosis not present

## 2020-07-17 DIAGNOSIS — N182 Chronic kidney disease, stage 2 (mild): Secondary | ICD-10-CM | POA: Diagnosis not present

## 2020-07-17 DIAGNOSIS — I959 Hypotension, unspecified: Secondary | ICD-10-CM | POA: Diagnosis not present

## 2020-07-17 DIAGNOSIS — I5189 Other ill-defined heart diseases: Secondary | ICD-10-CM | POA: Diagnosis not present

## 2020-07-21 DIAGNOSIS — Z961 Presence of intraocular lens: Secondary | ICD-10-CM | POA: Diagnosis not present

## 2020-07-22 DIAGNOSIS — H903 Sensorineural hearing loss, bilateral: Secondary | ICD-10-CM | POA: Diagnosis not present

## 2020-07-22 DIAGNOSIS — R42 Dizziness and giddiness: Secondary | ICD-10-CM | POA: Diagnosis not present

## 2020-07-22 DIAGNOSIS — H832X3 Labyrinthine dysfunction, bilateral: Secondary | ICD-10-CM | POA: Diagnosis not present

## 2020-07-23 DIAGNOSIS — G894 Chronic pain syndrome: Secondary | ICD-10-CM | POA: Diagnosis not present

## 2020-07-23 DIAGNOSIS — I5032 Chronic diastolic (congestive) heart failure: Secondary | ICD-10-CM | POA: Diagnosis not present

## 2020-07-23 DIAGNOSIS — N179 Acute kidney failure, unspecified: Secondary | ICD-10-CM | POA: Diagnosis not present

## 2020-07-23 DIAGNOSIS — K8500 Idiopathic acute pancreatitis without necrosis or infection: Secondary | ICD-10-CM | POA: Diagnosis not present

## 2020-07-23 DIAGNOSIS — I959 Hypotension, unspecified: Secondary | ICD-10-CM | POA: Diagnosis not present

## 2020-07-23 DIAGNOSIS — N182 Chronic kidney disease, stage 2 (mild): Secondary | ICD-10-CM | POA: Diagnosis not present

## 2020-07-28 DIAGNOSIS — N179 Acute kidney failure, unspecified: Secondary | ICD-10-CM | POA: Diagnosis not present

## 2020-07-28 DIAGNOSIS — N182 Chronic kidney disease, stage 2 (mild): Secondary | ICD-10-CM | POA: Diagnosis not present

## 2020-07-28 DIAGNOSIS — G894 Chronic pain syndrome: Secondary | ICD-10-CM | POA: Diagnosis not present

## 2020-07-28 DIAGNOSIS — K8500 Idiopathic acute pancreatitis without necrosis or infection: Secondary | ICD-10-CM | POA: Diagnosis not present

## 2020-07-28 DIAGNOSIS — I5032 Chronic diastolic (congestive) heart failure: Secondary | ICD-10-CM | POA: Diagnosis not present

## 2020-07-28 DIAGNOSIS — I959 Hypotension, unspecified: Secondary | ICD-10-CM | POA: Diagnosis not present

## 2020-07-30 DIAGNOSIS — Z86718 Personal history of other venous thrombosis and embolism: Secondary | ICD-10-CM | POA: Diagnosis not present

## 2020-07-30 DIAGNOSIS — K219 Gastro-esophageal reflux disease without esophagitis: Secondary | ICD-10-CM | POA: Diagnosis not present

## 2020-07-30 DIAGNOSIS — K8500 Idiopathic acute pancreatitis without necrosis or infection: Secondary | ICD-10-CM | POA: Diagnosis not present

## 2020-07-30 DIAGNOSIS — G894 Chronic pain syndrome: Secondary | ICD-10-CM | POA: Diagnosis not present

## 2020-07-30 DIAGNOSIS — D509 Iron deficiency anemia, unspecified: Secondary | ICD-10-CM | POA: Diagnosis not present

## 2020-07-30 DIAGNOSIS — I5032 Chronic diastolic (congestive) heart failure: Secondary | ICD-10-CM | POA: Diagnosis not present

## 2020-07-30 DIAGNOSIS — N179 Acute kidney failure, unspecified: Secondary | ICD-10-CM | POA: Diagnosis not present

## 2020-07-30 DIAGNOSIS — I959 Hypotension, unspecified: Secondary | ICD-10-CM | POA: Diagnosis not present

## 2020-07-30 DIAGNOSIS — F32A Depression, unspecified: Secondary | ICD-10-CM | POA: Diagnosis not present

## 2020-07-30 DIAGNOSIS — F419 Anxiety disorder, unspecified: Secondary | ICD-10-CM | POA: Diagnosis not present

## 2020-07-30 DIAGNOSIS — N182 Chronic kidney disease, stage 2 (mild): Secondary | ICD-10-CM | POA: Diagnosis not present

## 2020-07-30 DIAGNOSIS — G4700 Insomnia, unspecified: Secondary | ICD-10-CM | POA: Diagnosis not present

## 2020-08-04 DIAGNOSIS — H2511 Age-related nuclear cataract, right eye: Secondary | ICD-10-CM | POA: Diagnosis not present

## 2020-08-07 DIAGNOSIS — I129 Hypertensive chronic kidney disease with stage 1 through stage 4 chronic kidney disease, or unspecified chronic kidney disease: Secondary | ICD-10-CM | POA: Diagnosis not present

## 2020-08-07 DIAGNOSIS — F325 Major depressive disorder, single episode, in full remission: Secondary | ICD-10-CM | POA: Diagnosis not present

## 2020-08-07 DIAGNOSIS — G4700 Insomnia, unspecified: Secondary | ICD-10-CM | POA: Diagnosis not present

## 2020-08-07 DIAGNOSIS — D508 Other iron deficiency anemias: Secondary | ICD-10-CM | POA: Diagnosis not present

## 2020-08-07 DIAGNOSIS — N1831 Chronic kidney disease, stage 3a: Secondary | ICD-10-CM | POA: Diagnosis not present

## 2020-08-13 DIAGNOSIS — M5442 Lumbago with sciatica, left side: Secondary | ICD-10-CM | POA: Diagnosis not present

## 2020-08-13 DIAGNOSIS — M79671 Pain in right foot: Secondary | ICD-10-CM | POA: Diagnosis not present

## 2020-08-13 DIAGNOSIS — R519 Headache, unspecified: Secondary | ICD-10-CM | POA: Diagnosis not present

## 2020-08-13 DIAGNOSIS — I7381 Erythromelalgia: Secondary | ICD-10-CM | POA: Diagnosis not present

## 2020-08-13 DIAGNOSIS — Z978 Presence of other specified devices: Secondary | ICD-10-CM | POA: Diagnosis not present

## 2020-08-13 DIAGNOSIS — M542 Cervicalgia: Secondary | ICD-10-CM | POA: Diagnosis not present

## 2020-08-13 DIAGNOSIS — M792 Neuralgia and neuritis, unspecified: Secondary | ICD-10-CM | POA: Diagnosis not present

## 2020-08-13 DIAGNOSIS — Z79899 Other long term (current) drug therapy: Secondary | ICD-10-CM | POA: Diagnosis not present

## 2020-08-13 DIAGNOSIS — M79672 Pain in left foot: Secondary | ICD-10-CM | POA: Diagnosis not present

## 2020-08-13 DIAGNOSIS — M79641 Pain in right hand: Secondary | ICD-10-CM | POA: Diagnosis not present

## 2020-08-13 DIAGNOSIS — B0229 Other postherpetic nervous system involvement: Secondary | ICD-10-CM | POA: Diagnosis not present

## 2020-08-13 DIAGNOSIS — M79642 Pain in left hand: Secondary | ICD-10-CM | POA: Diagnosis not present

## 2020-08-14 ENCOUNTER — Ambulatory Visit: Payer: Medicare Other | Attending: Otolaryngology | Admitting: Physical Therapy

## 2020-08-14 ENCOUNTER — Other Ambulatory Visit: Payer: Self-pay

## 2020-08-14 ENCOUNTER — Encounter: Payer: Self-pay | Admitting: Physical Therapy

## 2020-08-14 DIAGNOSIS — R2681 Unsteadiness on feet: Secondary | ICD-10-CM | POA: Insufficient documentation

## 2020-08-14 DIAGNOSIS — R296 Repeated falls: Secondary | ICD-10-CM

## 2020-08-14 DIAGNOSIS — I5032 Chronic diastolic (congestive) heart failure: Secondary | ICD-10-CM | POA: Diagnosis not present

## 2020-08-14 DIAGNOSIS — I959 Hypotension, unspecified: Secondary | ICD-10-CM | POA: Diagnosis not present

## 2020-08-14 DIAGNOSIS — M6281 Muscle weakness (generalized): Secondary | ICD-10-CM | POA: Insufficient documentation

## 2020-08-14 DIAGNOSIS — R42 Dizziness and giddiness: Secondary | ICD-10-CM | POA: Diagnosis not present

## 2020-08-14 DIAGNOSIS — R2689 Other abnormalities of gait and mobility: Secondary | ICD-10-CM

## 2020-08-14 DIAGNOSIS — N179 Acute kidney failure, unspecified: Secondary | ICD-10-CM | POA: Diagnosis not present

## 2020-08-14 DIAGNOSIS — K8500 Idiopathic acute pancreatitis without necrosis or infection: Secondary | ICD-10-CM | POA: Diagnosis not present

## 2020-08-14 DIAGNOSIS — G894 Chronic pain syndrome: Secondary | ICD-10-CM | POA: Diagnosis not present

## 2020-08-14 DIAGNOSIS — N182 Chronic kidney disease, stage 2 (mild): Secondary | ICD-10-CM | POA: Diagnosis not present

## 2020-08-14 NOTE — Therapy (Signed)
[facility] [address] [place], [place], [address] Phone: [phone]   Fax:  [phone]  Physical Therapy Evaluation  Patient Details  Name: Kelly Curtis MRN: [id number] Date of Birth: January 24, 1946 Referring Provider (PT): Leta Baptist, MD   Encounter Date: 08/14/2020   PT End of Session - 08/14/20 1616    Visit Number 1    Number of Visits 9    Date for PT Re-Evaluation 10/13/20    Authorization Type Medicare and [employer]    Progress Note Due on Visit 10    PT Start Time 1152    PT Stop Time 1235    PT Time Calculation (min) 43 min    Activity Tolerance Treatment limited secondary to medical complications (Comment);Patient limited by pain    Behavior During Therapy --   tearful          Past Medical History:  Diagnosis Date  . Arthritis   . Cancer ([place])    skin cancer x 3  . Chronic kidney disease   . Constipation due to pain medication   . Depression with anxiety   . DVT (deep venous thrombosis) [facility])    age 75ish  . Dysrhythmia    PVCs; perioperative PAF with MV repair ~ 2000   . 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 GERD (gastroesophageal reflux disease)   . Headache    migraine heads- 4 times a year  . History of blood transfusion   . Hypotension   . 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 AUGMENTATION MAMMAPLASTY Bilateral   . CHOLECYSTECTOMY N/A 12/12/2019   Procedure: LAPAROSCOPIC CHOLECYSTECTOMY;  Surgeon: [name], MD;  Location: [facility];  Service: General;  Laterality: N/A;  . COLONOSCOPY    . ENDOSCOPIC RETROGRADE CHOLANGIOPANCREATOGRAPHY (ERCP) WITH PROPOFOL N/A 09/17/2019   Procedure: ENDOSCOPIC RETROGRADE CHOLANGIOPANCREATOGRAPHY (ERCP) WITH PROPOFOL;  Surgeon: Clarene Essex, MD;  [facility];  Service: Endoscopy;  Laterality: N/A;  . ESOPHAGOGASTRODUODENOSCOPY  (EGD) WITH PROPOFOL N/A 09/06/2019   Procedure: ESOPHAGOGASTRODUODENOSCOPY (EGD) WITH PROPOFOL;  Surgeon: Ronnette Juniper, MD;  Location[facility];  Service: Gastroenterology;  Laterality: N/A;  . EYE SURGERY Bilateral    cataract  . IRRIGATION AND DEBRIDEMENT ABSCESS N/A 02/10/2020   Procedure: IRRIGATION AND DEBRIDEMENT ABSCESS;  Surgeon: Clovis Riley, MD;  Location: [facility];  Service: General;  Laterality: N/A;  . MITRAL VALVE REPAIR    . REMOVAL OF STONES  09/17/2019   Procedure: REMOVAL OF STONES;  Surgeon: Clarene Essex, MD;  [facility];  Service: Endoscopy;;  . [name]  09/17/2019   Procedure: [name];  Surgeon: Clarene Essex, MD;  [facility];  Service: Endoscopy;;  . TOTAL SHOULDER ARTHROPLASTY Right 2013    There were no vitals filed for this visit.    Subjective Assessment - 08/14/20 1157    Subjective Dizziness has been going on for several months.  Doesn't remember how it started.  Pt denies true vertigo; feels like rocking back and forth.  Pt is in bed most of the day due to the EM - gets up to go to her [facility], to eat and to practice walking with RW with aide.  Can feel the rocking when turning over in bed, when getting up OOB and when walking.  Feels it currently in sitting.  Was in the hospital a couple  of months ago and the dizziness increased with the illness.    Patient is accompained by: Family member    Pertinent History ERYTHROMELALGIA; OA, Skin CA, chronic kidney disease, depression with anxiety, DVT, dysrhythmia, HA, hypotension, mitral valve disorder, R total shoulder arthroplasty, tinnitus, hearing loss, memory impairments, visual impairments    Limitations Standing;Walking    Patient Stated Goals To return to cooking her own meals, to decrease the dizziness, going out to lunch with friends.    Currently in Pain? Yes              [facility] PT Assessment - 08/14/20 1207      Assessment   Medical Diagnosis Dizziness    Referring  Provider (PT) Leta Baptist, MD    Onset Date/Surgical Date 08/04/20    Hand Dominance Right      Precautions   Precautions Other (comment)    Precaution Comments ERYTHROMELALGIA - has two spinal cord stimulators and pain pump to manage pain; OA, Skin CA, chronic kidney disease, depression with anxiety, DVT, dysrhythmia, HA, hypotension, mitral valve disorder, R total shoulder arthroplasty, tinnitus, hearing loss, memory impairments, visual impairments      Balance Screen   Has the patient fallen in the past 6 months Yes    How many times? more than she can count; has struck her head multiple times    Has the patient had a decrease in activity level because of a fear of falling?  Yes    Is the patient reluctant to leave their home because of a fear of falling?  Yes      [place] Private residence    Living Arrangements Alone    Available Help at Discharge Personal care attendant    Type of [place] to enter    Entrance Stairs-Number of Steps 1    [place] One level    [place] - 2 wheels;Bedside commode;Shower seat;Grab bars - toilet;Grab bars - tub/shower;Transport chair    Additional Comments Pt has CNA that will be with her 24/7 due to falls, is no longer driving      Prior Function   Level of Independence Independent with basic ADLs;Needs assistance with homemaking;Needs assistance with gait;Needs assistance with transfers    Vocation Retired    Advice worker, going out to eat with friends      Cognition   Overall Cognitive Status Impaired/Different from baseline    Area of Impairment Memory    Attention Sustained    Sustained Attention Impaired    Memory Impaired      Observation/Other Assessments   Skin Integrity Redness and edema due to EM; Bruising due to falls    Focus on Therapeutic Outcomes (FOTO)  Dizziness Positional Status: 39 and  Dizziness Functional Status: 27.4 - FOTO D/C and goal not set today due to not true vertigo and patient has progressive disorder that limits mobility      Observation/Other Assessments-Edema    Edema --   edema in hands and feet due to EM     Sensation   Light Touch Impaired Detail    Light Touch Impaired Details Absent RLE;Absent LLE    Additional Comments absent in feet      Coordination   Gross Motor Movements are Fluid and Coordinated No      ROM / Strength   AROM / PROM /  Strength Strength      Strength   Overall Strength Deficits    Overall Strength Comments 3+/5 bilateral LE      Transfers   Transfers Sit to Stand;Stand to Sit    Sit to Stand 4: Min assist    Five time sit to stand comments  54 seconds using UE, min A from therapist with pt holding arm rests in standing and leaning backwards.  Reports increased in tinnitus and "merry go round" to the R.    Stand to Sit 4: Min assist                  Vestibular Assessment - 08/14/20 1222      Vestibular Assessment   General Observation sits with head tilted and rotated to L.  Reports tinnitus also worsens with activity.      Symptom Behavior   Subjective history of current problem --    Type of Dizziness  Imbalance;Comment   merry go round; tinnitus   Frequency of Dizziness daily    Duration of Dizziness hours    Symptom Nature Motion provoked    Aggravating Factors Activity in general    Relieving Factors Lying supine;Comments   fan on her feet   Progression of Symptoms Worse      Oculomotor Exam   Oculomotor Alignment Abnormal   exophoria with cover-uncover test   Spontaneous Absent    Gaze-induced  Absent    Smooth Pursuits Intact   but increases dizziness, nausea looking up   Saccades Slow;Poor trajectory   dizziness, nausea   Comment recently had cataract surgery              Objective measurements completed on examination: See above findings.               PT Education -  08/14/20 1616    Education Details clinical findings, PT POC and goals, will continue evaluation at next visit due to decreased tolerance    Person(s) Educated Patient    Methods Explanation    Comprehension Verbalized understanding            PT Short Term Goals - 08/14/20 1627      PT SHORT TERM GOAL #1   Title Pt will be able to tolerate remainder of vestibular evaluation and evaluation of bed mobility, transfers and gait.    Time 4    Period Weeks    Status New    Target Date 09/13/20      PT SHORT TERM GOAL #2   Title Pt will demonstrate ability to perform initial HEP with supervision of CNA    Time 4    Period Weeks    Status New    Target Date 09/13/20      PT SHORT TERM GOAL #3   Title Pt will demonstrate decreased falls risk as indicated by decrease in five time sit to stand to </= 34 seconds and reporting mild-moderate dizziness    Baseline 54 seconds    Time 4    Period Weeks    Status New    Target Date 09/13/20      PT SHORT TERM GOAL #4   Title Pt will perform rolling and supine <> sit MOD I with very mild dizziness    Time 4    Period Weeks    Status New    Target Date 09/13/20      PT SHORT TERM GOAL #5   Title Pt will ambulate x  68' with LRAD and supervision over indoor, level surfaces with mild dizziness    Time 4    Period Weeks    Status New    Target Date 09/13/20             PT Long Term Goals - 08/14/20 1633      PT LONG TERM GOAL #1   Title Pt will demonstrate ability to perform final HEP with CNA supervision 5 days/week    Time 8    Period Weeks    Status New    Target Date 10/13/20      PT LONG TERM GOAL #2   Title Pt will demonstrate decreased falls risk as indicated by five time sit to stand in </= 20 seconds with very mild dizziness    Time 8    Period Weeks    Status New    Target Date 10/13/20      PT LONG TERM GOAL #3   Title Pt will ambulate 230' over indoor surfaces and negotiate one step with LRAD, supervision  and very mild dizziness.    Time 8    Period Weeks    Status New    Target Date 10/13/20      PT LONG TERM GOAL #4   Title Pt will demonstrate ability to maintain standing for >10-15 minutes while performing a task in the kitchen with supervision with very minimal dizziness in order to progress pt back to cooking    Time 8    Period Weeks    Status New    Target Date 10/13/20      PT LONG TERM GOAL #5   Title Other vestibular goals once assessment completed                  Plan - 08/14/20 1617    Clinical Impression Statement Pt is a 75 year old female referred to Neuro OPPT for evaluation of dizziness.  Pt's PMH is significant for the following: ERYTHROMELALGIA - has two spinal cord stimulators and pain pump to manage pain; OA, Skin CA, chronic kidney disease, depression with anxiety, DVT, dysrhythmia, HA, hypotension, mitral valve disorder, R total shoulder arthroplasty, tinnitus, hearing loss, memory impairments, visual impairments. The following deficits were noted during pt's exam: impaired activity tolerance due to pain and multiple months of immobility, impaired sensation in feet, impaired strength, abnormal head position and ocular alignment with exophoria, impaired oculomotor exam with significant visual motion sensitivity, disequilibrium/dizziness, impaired balance and abnormal gait.  Pt's five time sit to stand score indicates pt is at high risk for falls. Pt would benefit from skilled PT to address these impairments and functional limitations to maximize functional mobility independence and reduce falls risk.    Personal Factors and Comorbidities Comorbidity 3+;Fitness;Past/Current Experience;Social Background;Transportation    Comorbidities ERYTHROMELALGIA - has two spinal cord stimulators and pain pump to manage pain; OA, Skin CA, chronic kidney disease, depression with anxiety, DVT, dysrhythmia, HA, hypotension, mitral valve disorder, R total shoulder arthroplasty,  tinnitus, hearing loss, memory impairments, visual impairments    Examination-Activity Limitations Bathing;Bed Mobility;Bend;Dressing;Locomotion Level;Stairs;Stand;Toileting;Transfers    Examination-Participation Restrictions Cleaning;Community Activity;Laundry;Meal Prep;Shop    Stability/Clinical Decision Making Unstable/Unpredictable    Clinical Decision Making High    Rehab Potential Fair    PT Frequency 1x / week    PT Duration 8 weeks    PT Treatment/Interventions ADLs/Self Care Home Management;[place] Repostioning;Cryotherapy;DME Instruction;Gait training;Stair training;Functional mobility training;Therapeutic activities;Therapeutic exercise;Balance training;Neuromuscular re-education;Patient/family education;Energy conservation;Vestibular    PT Next  Visit Plan 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  Pt has very limited activity tolerance, go slow!  Finish vestibular eval, assess transfers, bed mobility, gait with RW as able.  Initiate low level HEP.  Oculomotor triggers dizziness, will have to start very basic.    Consulted and Agree with Plan of Care Patient           Patient will benefit from skilled therapeutic intervention in order to improve the following deficits and impairments:  Abnormal gait,Decreased activity tolerance,Decreased balance,Decreased endurance,Decreased mobility,Decreased strength,Difficulty walking,Dizziness,Increased edema,Impaired sensation,Pain  Visit Diagnosis: Repeated falls  Dizziness and giddiness  Unsteadiness on feet  Other abnormalities of gait and mobility  Muscle weakness (generalized)     Problem List Patient Active Problem List   Diagnosis Date Noted  . Chronic diastolic CHF (congestive heart failure) ([place]) 06/16/2020  . Iron deficiency anemia 06/16/2020  . Abdominal wall abscess 02/09/2020  . Chronic pain 02/09/2020  . HTN (hypertension) 03/24/2018  . Acute  on chronic diastolic CHF (congestive heart failure) ([place]) 03/23/2018  . Microcytic anemia 03/23/2018  . Leg swelling 03/23/2018  . GERD (gastroesophageal reflux disease)   . Depression with anxiety     Rico Junker, PT, DPT 08/14/20    4:39 PM    [facility] [address] [place], [place], [address] Phone: [phone]   Fax:  [phone]  Name: Lannette Avellino MRN: [id number] Date of Birth: 05-22-1945

## 2020-08-20 DIAGNOSIS — I5032 Chronic diastolic (congestive) heart failure: Secondary | ICD-10-CM | POA: Diagnosis not present

## 2020-08-20 DIAGNOSIS — N182 Chronic kidney disease, stage 2 (mild): Secondary | ICD-10-CM | POA: Diagnosis not present

## 2020-08-20 DIAGNOSIS — I959 Hypotension, unspecified: Secondary | ICD-10-CM | POA: Diagnosis not present

## 2020-08-20 DIAGNOSIS — G894 Chronic pain syndrome: Secondary | ICD-10-CM | POA: Diagnosis not present

## 2020-08-20 DIAGNOSIS — K8500 Idiopathic acute pancreatitis without necrosis or infection: Secondary | ICD-10-CM | POA: Diagnosis not present

## 2020-08-20 DIAGNOSIS — N179 Acute kidney failure, unspecified: Secondary | ICD-10-CM | POA: Diagnosis not present

## 2020-08-26 DIAGNOSIS — N182 Chronic kidney disease, stage 2 (mild): Secondary | ICD-10-CM | POA: Diagnosis not present

## 2020-08-26 DIAGNOSIS — I5032 Chronic diastolic (congestive) heart failure: Secondary | ICD-10-CM | POA: Diagnosis not present

## 2020-08-26 DIAGNOSIS — G894 Chronic pain syndrome: Secondary | ICD-10-CM | POA: Diagnosis not present

## 2020-08-26 DIAGNOSIS — K8500 Idiopathic acute pancreatitis without necrosis or infection: Secondary | ICD-10-CM | POA: Diagnosis not present

## 2020-08-26 DIAGNOSIS — I959 Hypotension, unspecified: Secondary | ICD-10-CM | POA: Diagnosis not present

## 2020-08-26 DIAGNOSIS — N179 Acute kidney failure, unspecified: Secondary | ICD-10-CM | POA: Diagnosis not present

## 2020-08-28 ENCOUNTER — Telehealth: Payer: Self-pay | Admitting: Physical Therapy

## 2020-08-28 ENCOUNTER — Other Ambulatory Visit: Payer: Self-pay

## 2020-08-28 ENCOUNTER — Encounter: Payer: Self-pay | Admitting: Physical Therapy

## 2020-08-28 ENCOUNTER — Ambulatory Visit: Payer: Medicare Other | Attending: Otolaryngology | Admitting: Physical Therapy

## 2020-08-28 DIAGNOSIS — M6281 Muscle weakness (generalized): Secondary | ICD-10-CM

## 2020-08-28 DIAGNOSIS — R296 Repeated falls: Secondary | ICD-10-CM | POA: Diagnosis not present

## 2020-08-28 DIAGNOSIS — R42 Dizziness and giddiness: Secondary | ICD-10-CM | POA: Diagnosis not present

## 2020-08-28 DIAGNOSIS — R2681 Unsteadiness on feet: Secondary | ICD-10-CM | POA: Diagnosis not present

## 2020-08-28 DIAGNOSIS — R2689 Other abnormalities of gait and mobility: Secondary | ICD-10-CM

## 2020-08-28 NOTE — Telephone Encounter (Signed)
Pt not able to tolerate outpatient PT for vestibular rehab at this time.  Have contacted PCP to request referral to HHPT to continue rehabilitation.   Rico Junker, PT, DPT 08/28/20    11:36 AM

## 2020-08-28 NOTE — Therapy (Signed)
[facility] [address] [place], [place], [address] Phone: [phone]   Fax:  [phone]  Physical Therapy Treatment and D/C Summary  Patient Details  Name: Kelly Curtis MRN: [id number] Date of Birth: Nov 11, 1945 Referring Provider (PT): Leta Baptist, MD   Encounter Date: 08/28/2020   PT End of Session - 08/28/20 1139    Visit Number 2    Number of Visits 9    Date for PT Re-Evaluation 10/13/20    Authorization Type Medicare and [employer]    Progress Note Due on Visit 10    PT Start Time 1106    PT Stop Time 1133    PT Time Calculation (min) 27 min    Activity Tolerance Treatment limited secondary to medical complications (Comment);Patient limited by pain    Behavior During Therapy Flat affect   tearful          Past Medical History:  Diagnosis Date  . Arthritis   . Cancer ([place])    skin cancer x 3  . Chronic kidney disease   . Constipation due to pain medication   . Depression with anxiety   . DVT (deep venous thrombosis) [facility])    age 67ish  . Dysrhythmia    PVCs; perioperative PAF with MV repair ~ 2000   . 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 GERD (gastroesophageal reflux disease)   . Headache    migraine heads- 4 times a year  . History of blood transfusion   . Hypotension   . 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 AUGMENTATION MAMMAPLASTY Bilateral   . CHOLECYSTECTOMY N/A 12/12/2019   Procedure: LAPAROSCOPIC CHOLECYSTECTOMY;  Surgeon: [name], MD;  Location: [facility];  Service: General;  Laterality: N/A;  . COLONOSCOPY    . ENDOSCOPIC RETROGRADE CHOLANGIOPANCREATOGRAPHY (ERCP) WITH PROPOFOL N/A 09/17/2019   Procedure: ENDOSCOPIC RETROGRADE CHOLANGIOPANCREATOGRAPHY (ERCP) WITH PROPOFOL;  Surgeon: Clarene Essex, MD;  [facility];  Service: Endoscopy;  Laterality: N/A;  .  ESOPHAGOGASTRODUODENOSCOPY (EGD) WITH PROPOFOL N/A 09/06/2019   Procedure: ESOPHAGOGASTRODUODENOSCOPY (EGD) WITH PROPOFOL;  Surgeon: Ronnette Juniper, MD;  [facility];  Service: Gastroenterology;  Laterality: N/A;  . EYE SURGERY Bilateral    cataract  . IRRIGATION AND DEBRIDEMENT ABSCESS N/A 02/10/2020   Procedure: IRRIGATION AND DEBRIDEMENT ABSCESS;  Surgeon: Clovis Riley, MD;  [facility];  Service: General;  Laterality: N/A;  . MITRAL VALVE REPAIR    . REMOVAL OF STONES  09/17/2019   Procedure: REMOVAL OF STONES;  Surgeon: Clarene Essex, MD;  [facility];  Service: Endoscopy;;  . [name]  09/17/2019   Procedure: [name];  Surgeon: Clarene Essex, MD;  [facility];  Service: Endoscopy;;  . TOTAL SHOULDER ARTHROPLASTY Right 2013    There were no vitals filed for this visit.   Subjective Assessment - 08/28/20 1113    Subjective Pt is back at her house with a [place].  Pt very dizzy and nauseous today due to heat outside.  Has been walking with CNA and RW at home.  Pt's symptoms exacerbated by car ride, lighting, heat and noise in clinic.  Pt unsure if she is able to participate in therapy today due to nausea.    Patient is accompained by: Family member    Pertinent History ERYTHROMELALGIA; OA, Skin CA, chronic kidney disease, depression with  anxiety, DVT, dysrhythmia, HA, hypotension, mitral valve disorder, R total shoulder arthroplasty, tinnitus, hearing loss, memory impairments, visual impairments    Limitations Standing;Walking    Patient Stated Goals To return to cooking her own meals, to decrease the dizziness, going out to lunch with friends.    Currently in Pain? Yes           Reviewed the following exercise in private treatment room with lights dimmed and noise reduced.  Initially attempted 10 reps but pt unable to tolerate due to severe dizziness and nausea.  Pt able to perform 5-6 reps with mild symptoms.    Gaze Stabilization:  Sitting    Keeping eyes on target on wall 3 feet away, and move head side to side for 5-6 repetitions. Repeat while moving head up and down for 5-6 repetitions.   Do __2-3__ sessions per day.      PT Education - 08/28/20 1138    Education Details seated slow VOR for HEP; will D/C from outpatient PT due to inability to tolerate and will request order from physician for HHPT    Person(s) Educated Patient;Caregiver(s)    Methods Explanation;Demonstration;Handout    Comprehension Verbalized understanding;Returned demonstration            PT Short Term Goals - 08/28/20 1139      PT SHORT TERM GOAL #1   Title Pt will be able to tolerate remainder of vestibular evaluation and evaluation of bed mobility, transfers and gait.    Time 4    Period Weeks    Status Unable to assess    Target Date 09/13/20      PT SHORT TERM GOAL #2   Title Pt will demonstrate ability to perform initial HEP with supervision of CNA    Time 4    Period Weeks    Status Unable to assess    Target Date 09/13/20      PT SHORT TERM GOAL #3   Title Pt will demonstrate decreased falls risk as indicated by decrease in five time sit to stand to </= 34 seconds and reporting mild-moderate dizziness    Baseline 54 seconds    Time 4    Period Weeks    Status Unable to assess    Target Date 09/13/20      PT SHORT TERM GOAL #4   Title Pt will perform rolling and supine <> sit MOD I with very mild dizziness    Time 4    Period Weeks    Status Unable to assess    Target Date 09/13/20      PT SHORT TERM GOAL #5   Title Pt will ambulate x 115' with LRAD and supervision over indoor, level surfaces with mild dizziness    Time 4    Period Weeks    Status Unable to assess    Target Date 09/13/20             PT Long Term Goals - 08/28/20 1140      PT LONG TERM GOAL #1   Title Pt will demonstrate ability to perform final HEP with CNA supervision 5 days/week    Time 8    Period Weeks    Status Unable to  assess      PT LONG TERM GOAL #2   Title Pt will demonstrate decreased falls risk as indicated by five time sit to stand in </= 20 seconds with very mild dizziness    Time 8    Period  Weeks    Status Unable to assess      PT LONG TERM GOAL #3   Title Pt will ambulate 230' over indoor surfaces and negotiate one step with LRAD, supervision and very mild dizziness.    Time 8    Period Weeks    Status Unable to assess      PT LONG TERM GOAL #4   Title Pt will demonstrate ability to maintain standing for >10-15 minutes while performing a task in the kitchen with supervision with very minimal dizziness in order to progress pt back to cooking    Time 8    Period Weeks    Status Unable to assess      PT LONG TERM GOAL #5   Title Other vestibular goals once assessment completed    Status Unable to assess                 Plan - 08/28/20 1140    Clinical Impression Statement Pt returns to therapy but unable to tolerate further vestibular or balance assessment today due to severity of symptoms after riding in the car, heat outside, bright lights and noise in clinic.  Due to dizziness and nausea pt does not feel she would be able to participate in outpatient therapies at this time.  PT to request referral to HHPT to continue rehabilitation.  PT did initiate slow VOR in sitting today with pt and caregiver.  PT assisted pt to car via wheelchair.  Will D/C at this time.    Personal Factors and Comorbidities Comorbidity 3+;Fitness;Past/Current Experience;Social Background;Transportation    Comorbidities ERYTHROMELALGIA - has two spinal cord stimulators and pain pump to manage pain; OA, Skin CA, chronic kidney disease, depression with anxiety, DVT, dysrhythmia, HA, hypotension, mitral valve disorder, R total shoulder arthroplasty, tinnitus, hearing loss, memory impairments, visual impairments    Examination-Activity Limitations Bathing;Bed Mobility;Bend;Dressing;Locomotion  Level;Stairs;Stand;Toileting;Transfers    Examination-Participation Restrictions Cleaning;Community Activity;Laundry;Meal Prep;Shop    Stability/Clinical Decision Making Unstable/Unpredictable    Rehab Potential Fair    PT Frequency 1x / week    PT Duration 8 weeks    PT Treatment/Interventions ADLs/Self Care Home Management;Canalith Repostioning;Cryotherapy;DME Instruction;Gait training;Stair training;Functional mobility training;Therapeutic activities;Therapeutic exercise;Balance training;Neuromuscular re-education;Patient/family education;Energy conservation;Vestibular    PT Next Visit Plan 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  D/C    Consulted and Agree with Plan of Care Patient           Patient will benefit from skilled therapeutic intervention in order to improve the following deficits and impairments:  Abnormal gait,Decreased activity tolerance,Decreased balance,Decreased endurance,Decreased mobility,Decreased strength,Difficulty walking,Dizziness,Increased edema,Impaired sensation,Pain  Visit Diagnosis: Repeated falls  Dizziness and giddiness  Unsteadiness on feet  Other abnormalities of gait and mobility  Muscle weakness (generalized)     Problem List Patient Active Problem List   Diagnosis Date Noted  . Chronic diastolic CHF (congestive heart failure) ([place]) 06/16/2020  . Iron deficiency anemia 06/16/2020  . Abdominal wall abscess 02/09/2020  . Chronic pain 02/09/2020  . HTN (hypertension) 03/24/2018  . Acute on chronic diastolic CHF (congestive heart failure) ([place]) 03/23/2018  . Microcytic anemia 03/23/2018  . Leg swelling 03/23/2018  . GERD (gastroesophageal reflux disease)   . Depression with anxiety    PHYSICAL THERAPY DISCHARGE SUMMARY  Visits from Start of Care: 2  Current functional level related to goals / functional outcomes: See impression statement above; pt would benefit  from HHPT at this time.  Remaining deficits: Severe dizziness, motion sensitivity, impaired balance, gait and impaired activity tolerance   Education / Equipment: HEP  Plan: Patient agrees to discharge.  Patient goals were not met. Patient is being discharged due to the patient's request.  ?????     Rico Junker, PT, DPT 08/28/20    11:47 AM    [facility] [address] [place], [place], [address] Phone: [phone]   Fax:  [phone]  Name: Kelly Curtis MRN: [id number] Date of Birth: [birth year]

## 2020-08-28 NOTE — Patient Instructions (Signed)
Gaze Stabilization: Sitting    Keeping eyes on target on wall 3 feet away, and move head side to side for 5-6 repetitions. Repeat while moving head up and down for 5-6 repetitions.   Do __2-3__ sessions per day.

## 2020-09-03 DIAGNOSIS — N1831 Chronic kidney disease, stage 3a: Secondary | ICD-10-CM | POA: Diagnosis not present

## 2020-09-03 DIAGNOSIS — D508 Other iron deficiency anemias: Secondary | ICD-10-CM | POA: Diagnosis not present

## 2020-09-03 DIAGNOSIS — F325 Major depressive disorder, single episode, in full remission: Secondary | ICD-10-CM | POA: Diagnosis not present

## 2020-09-03 DIAGNOSIS — I129 Hypertensive chronic kidney disease with stage 1 through stage 4 chronic kidney disease, or unspecified chronic kidney disease: Secondary | ICD-10-CM | POA: Diagnosis not present

## 2020-09-03 DIAGNOSIS — G4700 Insomnia, unspecified: Secondary | ICD-10-CM | POA: Diagnosis not present

## 2020-09-10 ENCOUNTER — Encounter: Payer: Medicare Other | Admitting: Physical Therapy

## 2020-09-15 DIAGNOSIS — N1831 Chronic kidney disease, stage 3a: Secondary | ICD-10-CM | POA: Diagnosis not present

## 2020-09-15 DIAGNOSIS — I7381 Erythromelalgia: Secondary | ICD-10-CM | POA: Diagnosis not present

## 2020-09-15 DIAGNOSIS — D508 Other iron deficiency anemias: Secondary | ICD-10-CM | POA: Diagnosis not present

## 2020-09-15 DIAGNOSIS — I129 Hypertensive chronic kidney disease with stage 1 through stage 4 chronic kidney disease, or unspecified chronic kidney disease: Secondary | ICD-10-CM | POA: Diagnosis not present

## 2020-09-15 DIAGNOSIS — I5189 Other ill-defined heart diseases: Secondary | ICD-10-CM | POA: Diagnosis not present

## 2020-09-18 ENCOUNTER — Encounter: Payer: Medicare Other | Admitting: Physical Therapy

## 2020-09-22 DIAGNOSIS — D649 Anemia, unspecified: Secondary | ICD-10-CM | POA: Diagnosis not present

## 2020-09-23 ENCOUNTER — Inpatient Hospital Stay (HOSPITAL_COMMUNITY)
Admission: EM | Admit: 2020-09-23 | Discharge: 2020-09-26 | DRG: 378 | Disposition: A | Discharge status: Home / Self Care | Payer: Medicare Other | Attending: Internal Medicine | Admitting: Internal Medicine

## 2020-09-23 ENCOUNTER — Encounter (HOSPITAL_COMMUNITY): Payer: Self-pay

## 2020-09-23 ENCOUNTER — Other Ambulatory Visit: Payer: Self-pay

## 2020-09-23 DIAGNOSIS — Z87891 Personal history of nicotine dependence: Secondary | ICD-10-CM

## 2020-09-23 DIAGNOSIS — F418 Other specified anxiety disorders: Secondary | ICD-10-CM | POA: Diagnosis present

## 2020-09-23 DIAGNOSIS — Z841 Family history of disorders of kidney and ureter: Secondary | ICD-10-CM

## 2020-09-23 DIAGNOSIS — D62 Acute posthemorrhagic anemia: Secondary | ICD-10-CM | POA: Diagnosis present

## 2020-09-23 DIAGNOSIS — G4700 Insomnia, unspecified: Secondary | ICD-10-CM

## 2020-09-23 DIAGNOSIS — R1310 Dysphagia, unspecified: Secondary | ICD-10-CM | POA: Diagnosis present

## 2020-09-23 DIAGNOSIS — Z66 Do not resuscitate: Secondary | ICD-10-CM | POA: Diagnosis present

## 2020-09-23 DIAGNOSIS — K2971 Gastritis, unspecified, with bleeding: Secondary | ICD-10-CM | POA: Diagnosis present

## 2020-09-23 DIAGNOSIS — Z820 Family history of epilepsy and other diseases of the nervous system: Secondary | ICD-10-CM

## 2020-09-23 DIAGNOSIS — T68XXXA Hypothermia, initial encounter: Secondary | ICD-10-CM | POA: Diagnosis not present

## 2020-09-23 DIAGNOSIS — R680 Hypothermia, not associated with low environmental temperature: Secondary | ICD-10-CM | POA: Diagnosis present

## 2020-09-23 DIAGNOSIS — N183 Chronic kidney disease, stage 3 unspecified: Secondary | ICD-10-CM

## 2020-09-23 DIAGNOSIS — N179 Acute kidney failure, unspecified: Secondary | ICD-10-CM

## 2020-09-23 DIAGNOSIS — K5731 Diverticulosis of large intestine without perforation or abscess with bleeding: Secondary | ICD-10-CM | POA: Diagnosis not present

## 2020-09-23 DIAGNOSIS — Z9049 Acquired absence of other specified parts of digestive tract: Secondary | ICD-10-CM

## 2020-09-23 DIAGNOSIS — N189 Chronic kidney disease, unspecified: Secondary | ICD-10-CM

## 2020-09-23 DIAGNOSIS — I5032 Chronic diastolic (congestive) heart failure: Secondary | ICD-10-CM | POA: Diagnosis not present

## 2020-09-23 DIAGNOSIS — Z20822 Contact with and (suspected) exposure to covid-19: Secondary | ICD-10-CM | POA: Diagnosis not present

## 2020-09-23 DIAGNOSIS — D649 Anemia, unspecified: Secondary | ICD-10-CM

## 2020-09-23 DIAGNOSIS — I7381 Erythromelalgia: Secondary | ICD-10-CM | POA: Diagnosis not present

## 2020-09-23 DIAGNOSIS — Z808 Family history of malignant neoplasm of other organs or systems: Secondary | ICD-10-CM

## 2020-09-23 DIAGNOSIS — G894 Chronic pain syndrome: Secondary | ICD-10-CM | POA: Diagnosis not present

## 2020-09-23 DIAGNOSIS — E860 Dehydration: Secondary | ICD-10-CM | POA: Diagnosis present

## 2020-09-23 DIAGNOSIS — N182 Chronic kidney disease, stage 2 (mild): Secondary | ICD-10-CM | POA: Diagnosis present

## 2020-09-23 DIAGNOSIS — K922 Gastrointestinal hemorrhage, unspecified: Secondary | ICD-10-CM | POA: Diagnosis present

## 2020-09-23 DIAGNOSIS — K2901 Acute gastritis with bleeding: Secondary | ICD-10-CM | POA: Diagnosis present

## 2020-09-23 DIAGNOSIS — K219 Gastro-esophageal reflux disease without esophagitis: Secondary | ICD-10-CM | POA: Diagnosis present

## 2020-09-23 DIAGNOSIS — G8929 Other chronic pain: Secondary | ICD-10-CM | POA: Diagnosis present

## 2020-09-23 DIAGNOSIS — Z96611 Presence of right artificial shoulder joint: Secondary | ICD-10-CM | POA: Diagnosis present

## 2020-09-23 DIAGNOSIS — Z79890 Hormone replacement therapy (postmenopausal): Secondary | ICD-10-CM

## 2020-09-23 DIAGNOSIS — Z79899 Other long term (current) drug therapy: Secondary | ICD-10-CM

## 2020-09-23 DIAGNOSIS — Z7982 Long term (current) use of aspirin: Secondary | ICD-10-CM

## 2020-09-23 DIAGNOSIS — Z85828 Personal history of other malignant neoplasm of skin: Secondary | ICD-10-CM

## 2020-09-23 DIAGNOSIS — Z8249 Family history of ischemic heart disease and other diseases of the circulatory system: Secondary | ICD-10-CM

## 2020-09-23 DIAGNOSIS — K648 Other hemorrhoids: Secondary | ICD-10-CM | POA: Diagnosis present

## 2020-09-23 HISTORY — DX: Acute posthemorrhagic anemia: D62

## 2020-09-23 LAB — POC OCCULT BLOOD, ED: Fecal Occult Bld: POSITIVE — AB

## 2020-09-23 LAB — COMPREHENSIVE METABOLIC PANEL
ALT: 16 U/L (ref 0–44)
AST: 21 U/L (ref 15–41)
Albumin: 3.2 g/dL — ABNORMAL LOW (ref 3.5–5.0)
Alkaline Phosphatase: 139 U/L — ABNORMAL HIGH (ref 38–126)
Anion gap: 6 (ref 5–15)
BUN: 46 mg/dL — ABNORMAL HIGH (ref 8–23)
CO2: 33 mmol/L — ABNORMAL HIGH (ref 22–32)
Calcium: 8.9 mg/dL (ref 8.9–10.3)
Chloride: 101 mmol/L (ref 98–111)
Creatinine, Ser: 1.64 mg/dL — ABNORMAL HIGH (ref 0.44–1.00)
GFR, Estimated: 33 mL/min — ABNORMAL LOW (ref >60)
Glucose, Bld: 90 mg/dL (ref 70–99)
Potassium: 4.6 mmol/L (ref 3.5–5.1)
Sodium: 140 mmol/L (ref 135–145)
Total Bilirubin: 0.7 mg/dL (ref 0.3–1.2)
Total Protein: 6.1 g/dL — ABNORMAL LOW (ref 6.5–8.1)

## 2020-09-23 LAB — CBC WITH DIFFERENTIAL/PLATELET
Abs Immature Granulocytes: 0.03 10*3/uL (ref 0.00–0.07)
Basophils Absolute: 0 10*3/uL (ref 0.0–0.1)
Basophils Relative: 0 %
Eosinophils Absolute: 0.8 10*3/uL — ABNORMAL HIGH (ref 0.0–0.5)
Eosinophils Relative: 9 %
HCT: 28.3 % — ABNORMAL LOW (ref 36.0–46.0)
Hemoglobin: 7.6 g/dL — ABNORMAL LOW (ref 12.0–15.0)
Immature Granulocytes: 0 %
Lymphocytes Relative: 9 %
Lymphs Abs: 0.8 10*3/uL (ref 0.7–4.0)
MCH: 24.5 pg — ABNORMAL LOW (ref 26.0–34.0)
MCHC: 26.9 g/dL — ABNORMAL LOW (ref 30.0–36.0)
MCV: 91.3 fL (ref 80.0–100.0)
Monocytes Absolute: 0.5 10*3/uL (ref 0.1–1.0)
Monocytes Relative: 5 %
Neutro Abs: 7 10*3/uL (ref 1.7–7.7)
Neutrophils Relative %: 77 %
Platelets: 383 10*3/uL (ref 150–400)
RBC: 3.1 MIL/uL — ABNORMAL LOW (ref 3.87–5.11)
RDW: 23.6 % — ABNORMAL HIGH (ref 11.5–15.5)
WBC: 9.1 10*3/uL (ref 4.0–10.5)
nRBC: 0.2 % (ref 0.0–0.2)

## 2020-09-23 LAB — PREPARE RBC (CROSSMATCH)

## 2020-09-23 LAB — RESP PANEL BY RT-PCR (FLU A&B, COVID) ARPGX2
Influenza A by PCR: NEGATIVE
Influenza B by PCR: NEGATIVE
SARS Coronavirus 2 by RT PCR: NEGATIVE

## 2020-09-23 MED ORDER — SUVOREXANT 10 MG PO TABS
10.0000 mg | ORAL_TABLET | Freq: Every day | ORAL | Status: DC
  Administered 2020-09-24: 10 mg via ORAL

## 2020-09-23 MED ORDER — PREDNISOLONE ACETATE 1 % OP SUSP
1.0000 [drp] | Freq: Four times a day (QID) | OPHTHALMIC | Status: DC
  Administered 2020-09-24 – 2020-09-26 (×6): 1 [drp] via OPHTHALMIC
  Filled 2020-09-23: qty 5

## 2020-09-23 MED ORDER — BUSPIRONE HCL 5 MG PO TABS
5.0000 mg | ORAL_TABLET | Freq: Two times a day (BID) | ORAL | Status: DC
  Administered 2020-09-24 – 2020-09-26 (×5): 5 mg via ORAL
  Filled 2020-09-23 (×5): qty 1

## 2020-09-23 MED ORDER — SODIUM CHLORIDE 0.9% IV SOLUTION: Freq: Once | INTRAVENOUS | Status: DC

## 2020-09-23 MED ORDER — MIRTAZAPINE 15 MG PO TABS
7.5000 mg | ORAL_TABLET | Freq: Every day | ORAL | Status: DC
  Administered 2020-09-24 – 2020-09-25 (×3): 7.5 mg via ORAL
  Filled 2020-09-23 (×3): qty 1

## 2020-09-23 MED ORDER — HYDROMORPHONE HCL 2 MG PO TABS
2.0000 mg | ORAL_TABLET | ORAL | Status: DC | PRN
  Administered 2020-09-24 – 2020-09-26 (×7): 2 mg via ORAL
  Filled 2020-09-23 (×7): qty 1

## 2020-09-23 MED ORDER — METHADONE HCL 10 MG PO TABS
10.0000 mg | ORAL_TABLET | Freq: Four times a day (QID) | ORAL | Status: DC
  Administered 2020-09-24 – 2020-09-26 (×9): 10 mg via ORAL
  Filled 2020-09-23 (×2): qty 2
  Filled 2020-09-23 (×5): qty 1
  Filled 2020-09-23 (×2): qty 2

## 2020-09-23 MED ORDER — BUPROPION HCL ER (XL) 150 MG PO TB24
150.0000 mg | ORAL_TABLET | Freq: Every day | ORAL | Status: DC
  Administered 2020-09-24 – 2020-09-26 (×2): 150 mg via ORAL
  Filled 2020-09-23 (×2): qty 1

## 2020-09-23 MED ORDER — DIPHENHYDRAMINE HCL 25 MG PO CAPS
25.0000 mg | ORAL_CAPSULE | Freq: Four times a day (QID) | ORAL | Status: DC | PRN
  Administered 2020-09-24 – 2020-09-25 (×2): 25 mg via ORAL
  Filled 2020-09-23 (×2): qty 1

## 2020-09-23 MED ORDER — PANTOPRAZOLE SODIUM 40 MG PO TBEC: 40.0000 mg | DELAYED_RELEASE_TABLET | Freq: Every day | ORAL | Status: DC

## 2020-09-23 MED ORDER — PAROXETINE HCL 10 MG PO TABS
10.0000 mg | ORAL_TABLET | Freq: Every day | ORAL | Status: DC
  Administered 2020-09-24 – 2020-09-26 (×2): 10 mg via ORAL
  Filled 2020-09-23 (×3): qty 1

## 2020-09-23 MED ORDER — TIZANIDINE HCL 4 MG PO TABS
4.0000 mg | ORAL_TABLET | Freq: Three times a day (TID) | ORAL | Status: DC | PRN
  Administered 2020-09-24 – 2020-09-25 (×3): 4 mg via ORAL
  Filled 2020-09-23 (×3): qty 1

## 2020-09-23 MED ORDER — MELATONIN 5 MG PO TABS
15.0000 mg | ORAL_TABLET | Freq: Every day | ORAL | Status: DC
  Administered 2020-09-24 – 2020-09-25 (×3): 15 mg via ORAL
  Filled 2020-09-23 (×3): qty 3

## 2020-09-23 MED ORDER — CYCLOSPORINE 0.05 % OP EMUL
1.0000 [drp] | Freq: Two times a day (BID) | OPHTHALMIC | Status: DC
  Administered 2020-09-24 – 2020-09-26 (×5): 1 [drp] via OPHTHALMIC
  Filled 2020-09-23 (×7): qty 1

## 2020-09-23 MED ORDER — LEVETIRACETAM 250 MG PO TABS
250.0000 mg | ORAL_TABLET | Freq: Two times a day (BID) | ORAL | Status: DC
  Administered 2020-09-24 – 2020-09-26 (×5): 250 mg via ORAL
  Filled 2020-09-23 (×7): qty 1

## 2020-09-23 MED ORDER — LORATADINE 10 MG PO TABS
10.0000 mg | ORAL_TABLET | Freq: Every day | ORAL | Status: DC
  Administered 2020-09-24 – 2020-09-26 (×2): 10 mg via ORAL
  Filled 2020-09-23 (×2): qty 1

## 2020-09-23 MED ORDER — MAGNESIUM GLUCONATE 500 MG PO TABS
500.0000 mg | ORAL_TABLET | Freq: Every day | ORAL | Status: DC
  Administered 2020-09-24 – 2020-09-26 (×2): 500 mg via ORAL
  Filled 2020-09-23 (×3): qty 1

## 2020-09-23 MED ORDER — POLYETHYL GLYC-PROPYL GLYC PF 0.4-0.3 % OP SOLN: 1.0000 [drp] | Freq: Two times a day (BID) | OPHTHALMIC | Status: DC

## 2020-09-23 MED ORDER — MIDODRINE HCL 5 MG PO TABS
10.0000 mg | ORAL_TABLET | Freq: Three times a day (TID) | ORAL | Status: DC
  Administered 2020-09-24 (×2): 10 mg via ORAL
  Filled 2020-09-23 (×3): qty 2

## 2020-09-23 MED ORDER — POLYVINYL ALCOHOL 1.4 % OP SOLN
1.0000 [drp] | Freq: Two times a day (BID) | OPHTHALMIC | Status: DC
  Administered 2020-09-24 – 2020-09-26 (×5): 1 [drp] via OPHTHALMIC
  Filled 2020-09-23: qty 15

## 2020-09-23 NOTE — ED Provider Notes (Signed)
[facility] DEPT Provider Note   CSN: [id number] Arrival date & time: 09/23/20  1215     History Chief Complaint  Patient presents with   Abnormal Lab    Kelly Curtis is a 75 y.o. female.   Abnormal Lab Patient presents with anemia.  Reportedly has hemoglobin of 7.2 from PCP.  Center for transfusion.  Reportedly has been on iron.  Has had bloody stools.  Does have a history of chronic anemia.  Reviewing records it appears that yesterday her hemoglobin was 7.3, on the 28th it was 7.8.  In March it was 9.0.  Also had been down in 7 and eights in March and had a transfusion while in the hospital.  Back in December hemoglobin was 11.4.  Patient says she feels lightheaded and dizzy.  States she has been feeling dizzy for 6 months however.  States she has an physical therapy for her vertigo.  She is not on blood thinners.    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    Patient Active Problem List   Diagnosis Date Noted   Chronic diastolic CHF (congestive heart failure) ([place]) 06/16/2020   Iron deficiency anemia 06/16/2020   Abdominal wall abscess 02/09/2020   Chronic pain 02/09/2020   HTN (hypertension) 03/24/2018   Acute on chronic diastolic CHF (congestive heart failure) ([place]) 03/23/2018   Microcytic anemia 03/23/2018   Leg swelling 03/23/2018   GERD (gastroesophageal reflux disease)    Depression with anxiety     Past Surgical  History:  Procedure Laterality Date   AUGMENTATION MAMMAPLASTY Bilateral    CHOLECYSTECTOMY N/A 12/12/2019   Procedure: LAPAROSCOPIC CHOLECYSTECTOMY;  Surgeon: [name], MD;  Location: [facility];  Service: General;  Laterality: N/A;   COLONOSCOPY     ENDOSCOPIC RETROGRADE CHOLANGIOPANCREATOGRAPHY (ERCP) WITH PROPOFOL N/A 09/17/2019   Procedure: ENDOSCOPIC RETROGRADE CHOLANGIOPANCREATOGRAPHY (ERCP) WITH PROPOFOL;  Surgeon: Clarene Essex, MD;  [facility];  Service: Endoscopy;  Laterality: N/A;   ESOPHAGOGASTRODUODENOSCOPY (EGD) WITH PROPOFOL N/A 09/06/2019   Procedure: ESOPHAGOGASTRODUODENOSCOPY (EGD) WITH PROPOFOL;  Surgeon: Ronnette Juniper, MD;  [facility];  Service: Gastroenterology;  Laterality: N/A;   EYE SURGERY Bilateral    cataract   IRRIGATION AND DEBRIDEMENT ABSCESS N/A 02/10/2020   Procedure: IRRIGATION AND DEBRIDEMENT ABSCESS;  Surgeon: Clovis Riley, MD;  [facility];  Service: General;  Laterality: N/A;   MITRAL VALVE REPAIR     REMOVAL OF STONES  09/17/2019   Procedure: REMOVAL OF STONES;  Surgeon: Clarene Essex, MD;  [facility];  Service: Endoscopy;;   SPHINCTEROTOMY  09/17/2019   Procedure: [name];  Surgeon: Clarene Essex, MD;  [facility];  Service: Endoscopy;;   TOTAL SHOULDER ARTHROPLASTY Right 2013     OB History   No obstetric history on file.  Family History  Problem Relation Age of Onset   Heart disease Mother    Alzheimer's disease Mother    Kidney disease Father    Melanoma Sister    Heart disease Brother     Social History   Tobacco Use   Smoking status: Former    Years: 32.00    Pack years: 0.00    Types: Cigarettes    Quit date: 2000    Years since quitting: 22.5   Smokeless tobacco: Never  Vaping Use   Vaping Use: Never used  Substance Use Topics   Alcohol use: Never   Drug use: Never    Home Medications Prior to Admission medications   Medication Sig Start Date End Date Taking?  Authorizing Provider  acetaminophen (TYLENOL) 500 MG tablet Take 1,000 mg by mouth every 6 (six) hours as needed for moderate pain.    [provider]  aspirin EC 81 MG tablet Take 81 mg by mouth daily as needed.    [provider]  buPROPion (WELLBUTRIN XL) 150 MG 24 hr tablet Take 150 mg by mouth daily.     [provider]  busPIRone (BUSPAR) 5 MG tablet Take 5 mg by mouth 2 (two) times daily. 06/04/20   [provider]  calcium carbonate (TUMS - DOSED IN MG ELEMENTAL CALCIUM) 500 MG chewable tablet Chew 500 mg by mouth daily as needed for indigestion or heartburn.    [provider]  cetirizine (ZYRTEC) 10 MG tablet Take 10 mg by mouth daily.    [provider]  cycloSPORINE (RESTASIS) 0.05 % ophthalmic emulsion Place 1 drop into both eyes 2 (two) times daily.    [provider]  dimenhyDRINATE (DRAMAMINE) 50 MG tablet Take 50 mg by mouth every 8 (eight) hours as needed for nausea or dizziness.    [provider]  diphenhydrAMINE (BENADRYL) 25 MG tablet Take 25 mg by mouth every 6 (six) hours as needed for itching or sleep.    [provider]  fluticasone (FLONASE) 50 MCG/ACT nasal spray Place 1 spray into both nostrils daily as needed for allergies or rhinitis.    [provider]  furosemide (LASIX) 20 MG tablet Take 20 mg by mouth 2 (two) times daily as needed for fluid.    [provider]  glycerin SUPP Place 1 Chip rectally daily as needed for moderate constipation.    [provider]  HYDROmorphone (DILAUDID) 2 MG tablet Take 2 mg by mouth every 4 (four) hours as needed for severe pain.    [provider]  levETIRAcetam (KEPPRA) 250 MG tablet Take 250 mg by mouth 2 (two) times daily.  12/17/19 06/15/20  [provider]  Lidocaine-Glycerin (PREPARATION H EX) Apply 1 application topically daily.    [provider]  Loteprednol Etabonate 0.5 % EMUL Place 1 drop into  both eyes 2 (two) times daily.    [provider]  melatonin 5 MG TABS Take 5 mg by mouth at bedtime.    [provider]  methadone (DOLOPHINE) 10 MG tablet Take 10 mg by mouth every 6 (six) hours.    [provider]  midodrine (PROAMATINE) 10 MG tablet Take 10 mg by mouth 3 (three) times daily.    [provider]  mirtazapine (REMERON) 15 MG tablet Take 7.5 mg by mouth at bedtime.    [provider]  naloxone [facility]) nasal spray 4 mg/0.1 mL Place 1 spray into the nose once as needed (opiod overdose).  [provider]  naproxen (NAPROSYN) 500 MG tablet Take 500 mg by mouth 2 (two) times daily.    [provider]  omeprazole (PRILOSEC) 20 MG capsule Take 20 mg by mouth daily.    [provider]  PARoxetine (PAXIL) 10 MG tablet Take 10 mg by mouth daily.     [provider]  Pediatric Multivitamins-Iron (FLINTSTONES PLUS IRON PO) Take 1 tablet by mouth daily.    [provider]  polyethylene glycol (MIRALAX / GLYCOLAX) 17 g packet Take 17 g by mouth daily.    [provider]  polyvinyl alcohol (LIQUIFILM TEARS) 1.4 % ophthalmic solution Place 1 drop into both eyes 3 (three) times daily as needed for dry eyes.     [provider]  Suvorexant (BELSOMRA) 10 MG TABS Take 10 mg by mouth daily.    [provider]  tiZANidine (ZANAFLEX) 4 MG tablet Take 4 mg by mouth 3 (three) times daily as needed for muscle spasms.     [provider]    Allergies    Pregabalin, Demerol  [meperidine hcl], Ferrous sulfate, Gabapentin, Hydroxyzine, Macrolides and ketolides, Midazolam, Trazodone and nefazodone, Ambien [zolpidem], Latex, Escitalopram oxalate, Penicillins, and Vancomycin  Review of Systems   Review of Systems  Constitutional:  Negative for appetite change.  HENT:  Negative for congestion.   Respiratory:  Negative for chest tightness.   Cardiovascular:  Negative for chest pain.   Gastrointestinal:  Negative for abdominal pain.  Genitourinary:  Negative for dysuria.  Musculoskeletal:  Negative for arthralgias.  Skin:  Positive for rash.  Neurological:  Positive for dizziness and light-headedness.  Psychiatric/Behavioral:  Negative for confusion.    Physical Exam Updated Vital Signs BP 131/78   Pulse 66   Resp 15   SpO2 97%   Physical Exam Vitals and nursing note reviewed.  HENT:     Head: Atraumatic.  Eyes:     Pupils: Pupils are equal, round, and reactive to light.  Cardiovascular:     Rate and Rhythm: Regular rhythm.  Pulmonary:     Breath sounds: Normal breath sounds.  Abdominal:     Comments: Pain pump in abdomen.  Genitourinary:    Comments: Mostly brown but some purplish stool on rectal exam. Musculoskeletal:     Cervical back: Neck supple.     Right lower leg: Edema present.     Left lower leg: Edema present.  Skin:    Comments: Chronic changes of bilateral feet and hands.  Neurological:     Mental Status: She is alert and oriented to person, place, and time.    ED Results / Procedures / Treatments   Labs (all labs ordered are listed, but only abnormal results are displayed) Labs Reviewed  CBC WITH DIFFERENTIAL/PLATELET - Abnormal; Notable for the following components:      Result Value   RBC 3.10 (*)    Hemoglobin 7.6 (*)    HCT 28.3 (*)    MCH 24.5 (*)    MCHC 26.9 (*)    RDW 23.6 (*)    All other components within normal limits  COMPREHENSIVE METABOLIC PANEL - Abnormal; Notable for the following components:   CO2 33 (*)    BUN 46 (*)    Creatinine, Ser 1.64 (*)    Total Protein 6.1 (*)    Albumin 3.2 (*)    Alkaline Phosphatase 139 (*)    GFR, Estimated 33 (*)    All other components within normal limits  POC  OCCULT BLOOD, ED - Abnormal; Notable for the following components:   Fecal Occult Bld POSITIVE (*)    All other components within normal limits  URINALYSIS, ROUTINE W REFLEX MICROSCOPIC  TYPE AND SCREEN     EKG None  Radiology No results found.  Procedures Procedures   Medications Ordered in ED Medications - No data to display  ED Course  I have reviewed the triage vital signs and the nursing notes.  Pertinent labs & imaging results that were available during my care of the patient were reviewed by me and considered in my medical decision making (see chart for details).    MDM Rules/Calculators/A&P                          Patient sent in for anemia.  Hemoglobin of 7.3 as an outpatient 7.6 here.  Has been this low previously but appears to be more symptomatic now.  More lightheadedness.  More dizziness but does have a secondary vertigo also.  That has been previously worked up.  She is guaiac positive.  Has had red stools.  I think likely lower GI bleed although probably somewhat chronic.  Has seen [facility] in the past and they have been notified and will see patient.  Will require transfusion admission.  Will discuss with hospitalist  CRITICAL CARE Performed by: Davonna Belling Total critical care time 30 minutes Critical care time was exclusive of separately billable procedures and treating other patients. Critical care was necessary to treat or prevent imminent or life-threatening deterioration. Critical care was time spent personally by me on the following activities: development of treatment plan with patient and/or surrogate as well as nursing, discussions with consultants, evaluation of patient's response to treatment, examination of patient, obtaining history from patient or surrogate, ordering and performing treatments and interventions, ordering and review of laboratory studies, ordering and review of radiographic studies, pulse oximetry and re-evaluation of patient's condition.  Final Clinical Impression(s) / ED Diagnoses Final diagnoses:  Symptomatic anemia  Gastrointestinal hemorrhage, unspecified gastrointestinal hemorrhage type    Rx / DC Orders ED Discharge  Orders     None        Davonna Belling, MD 09/23/20 [phone]

## 2020-09-23 NOTE — Progress Notes (Signed)
Patient came to the unit from ED yellow mews d/t low temp. NP at bedside. Careers information officer notified. Yellow MEWS protocol initiated.   09/23/20 2101  Assess: MEWS Score  BP 99/74  Pulse Rate 63  Resp 18  SpO2 97 %  O2 Device Room Air  Assess: MEWS Score  MEWS Temp 2  MEWS Systolic 1  MEWS Pulse 0  MEWS RR 0  MEWS LOC 0  MEWS Score 3  MEWS Score Color Yellow  Assess: if the MEWS score is Yellow or Red  Were vital signs taken at a resting state? Yes  Focused Assessment No change from prior assessment  Does the patient meet 2 or more of the SIRS criteria? No  MEWS guidelines implemented *See Row Information* Yes  Treat  MEWS Interventions Escalated (See documentation below)  Pain Scale 0-10  Pain Score 0  Take Vital Signs  Increase Vital Sign Frequency  Yellow: Q 2hr X 2 then Q 4hr X 2, if remains yellow, continue Q 4hrs  Escalate  MEWS: Escalate Yellow: discuss with charge nurse/RN and consider discussing with provider and RRT  Notify: Charge Nurse/RN  Name of Charge Nurse/RN Notified Tom RN  Date Charge Nurse/RN Notified 09/23/20  Time Charge Nurse/RN Notified 2105  Notify: Provider  Provider Name/Title J.Olena Heckle  Date Provider Notified 09/23/20  Time Provider Notified 2130  Notification Type Face-to-face (on unit making rounds)  Notification Reason Other (Comment) (low temp)  Provider response Other (Comment);At bedside  Date of Provider Response 09/23/20  Time of Provider Response 2130  Document  Patient Outcome Other (Comment) (remain on unit)  Progress note created (see row info) Yes

## 2020-09-23 NOTE — ED Notes (Signed)
Pt oral and axillary temp read 93.4. Pt states this is her normal and that she has been treated for hypothermia before. Pt currently has been experiencing diarrhea and "prefers" not to have a rectal temp taken.

## 2020-09-23 NOTE — Progress Notes (Signed)
Patient arrived to the unit via stretcher from the ED. Patient was able to transfer from stretcher to bed by scooting without assistance. VS taken and temp was low. She arrived to the unit as yellow MEWS d/t low temp. Gershon Mussel, charge nurse notified and J.Olena Heckle NP was on the unit and notified. Yellow MEWS protocol initiated. IV team consult ordered, pt arrived to the unit without IV access and need 1 unit of RBCs. Will continue to monitor the patient.

## 2020-09-23 NOTE — ED Triage Notes (Signed)
Pt arrived via POV, states recent blood work done states hemoglobin 7.2. Endorses dizziness and SOB with exertion. Also bright red stool for many months.

## 2020-09-23 NOTE — Progress Notes (Signed)
Spoke with ED nurse [place] via secure chat and requested that pt get a new set of VS to include temp prior to accepting the patient. Patients Temp was 92. This nurse spoke with Gershon Mussel, Charge Nurse about the low temp and was informed that he was aware of low temp and according to the patient that is normal for her. Patient was accepted.

## 2020-09-23 NOTE — H&P (Signed)
History and Physical    Kelly Curtis IEP:[id number] DOB: Nov 12, 1945 DOA: 09/23/2020  PCP: [name], MD  Patient coming from: Home, brother-in-law who is a former paramedic is at bedside  I have personally briefly reviewed patient's old medical records in [place]  Chief Complaint: low hemoglobin  HPI: Kelly Curtis is a 75 y.o. 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  Patient reports that she saw her PCP yesterday and was found to have low hemoglobin on lab work of 7.3 and asked to present to the ED.  She has a history of chronic vertigo followed by ENT but states this is no worse.  However has been having increasing dyspnea with exertion ever since November 2021 following a hospitalization for abdominal wall abscess. Lab work also showed decreasing Hgb since then. She was hospitalized in March 2022 with acute pancreatitis and at that time had acute anemia with hemoglobin of 6.5 requiring PRBC transfusion and also 1 dose of Feraheme. She has noted several months of bright red blood with her bowel movements but she does have hemorrhoids. Had been nauseous since her cholecystectomy in September 2021.  Had an episode of diarrhea earlier this morning thinks it was due to pizza she had overnight.  Reportedly had colonoscopy and EGD by [facility] back in 2018 that had an otherwise noncancerous polyp removed. She is not on any blood thinners.   ED Course: She was hypothermic down to 25F, Hgb of 7.6, Creatinine elevated to 1.64 from a prior of 0.78.   Hospitalist on-call for admission for symptomatic anemia. [facility] was notified by ED physician Dr. Alvino Chapel.    Review of Systems: Constitutional: No Weight Change, No Fever ENT/Mouth: No sore  throat, No Rhinorrhea Eyes: No Eye Pain, No Vision Changes Cardiovascular: No Chest Pain, + SOB, No PND, + Dyspnea on Exertion, No Orthopnea, No , No Edema,  Respiratory: No Cough, No Sputum Gastrointestinal: + Nausea, No Vomiting, + Diarrhea, No Constipation, No Pain Genitourinary: no Urinary Incontinence, No Urgency, No Flank Pain Musculoskeletal: No Arthralgias, No Myalgias Skin: No Skin Lesions, No Pruritus, Neuro: no Weakness, No Numbness Psych: No Anxiety/Panic, No Depression, no decrease appetite Heme/Lymph: No Bruising, + Bleeding  Past Medical History:  Diagnosis Date   Arthritis    Cancer (HCC)    skin cancer x 3   Chronic kidney disease    Constipation due to pain medication    Depression with anxiety    DVT (deep venous thrombosis) [facility])    age 18ish   Dysrhythmia    PVCs; perioperative PAF with MV repair ~ 2000    Erythermalgia ([place])    Erythromelalgia ([place])    GERD (gastroesophageal reflux disease)    Headache    migraine heads- 4 times a year   History of blood transfusion    Hypotension    Mitral valve disorder    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    Past Surgical History:  Procedure Laterality Date   AUGMENTATION MAMMAPLASTY Bilateral    CHOLECYSTECTOMY N/A 12/12/2019   Procedure: LAPAROSCOPIC CHOLECYSTECTOMY;  Surgeon: [name], MD;  Location: [facility];  Service: General;  Laterality: N/A;   COLONOSCOPY     ENDOSCOPIC RETROGRADE CHOLANGIOPANCREATOGRAPHY (ERCP) WITH PROPOFOL N/A 09/17/2019   Procedure: ENDOSCOPIC RETROGRADE CHOLANGIOPANCREATOGRAPHY (ERCP)  WITH PROPOFOL;  Surgeon: Clarene Essex, MD;  Location: [facility];  Service: Endoscopy;  Laterality: N/A;   ESOPHAGOGASTRODUODENOSCOPY (EGD) WITH PROPOFOL N/A 09/06/2019   Procedure: ESOPHAGOGASTRODUODENOSCOPY (EGD) WITH PROPOFOL;  Surgeon: Ronnette Juniper, MD;  Location: [facility];  Service: Gastroenterology;  Laterality: N/A;   EYE SURGERY Bilateral    cataract   IRRIGATION AND  DEBRIDEMENT ABSCESS N/A 02/10/2020   Procedure: IRRIGATION AND DEBRIDEMENT ABSCESS;  Surgeon: Clovis Riley, MD;  Location: [facility];  Service: General;  Laterality: N/A;   MITRAL VALVE REPAIR     REMOVAL OF STONES  09/17/2019   Procedure: REMOVAL OF STONES;  Surgeon: Clarene Essex, MD;  Location: [facility];  Service: Endoscopy;;   SPHINCTEROTOMY  09/17/2019   Procedure: [name];  Surgeon: Clarene Essex, MD;  Location: [facility];  Service: Endoscopy;;   TOTAL SHOULDER ARTHROPLASTY Right 2013     reports that she quit smoking about 22 years ago. Her smoking use included cigarettes. She has never used smokeless tobacco. She reports that she does not drink alcohol and does not use drugs. Social History  Allergies  Allergen Reactions   Pregabalin     Other reaction(s): Other, Suicidal ideations Suicidal ideations    Demerol  [Meperidine Hcl] Nausea And Vomiting   Ferrous Sulfate Nausea And Vomiting    Other reaction(s): upset stomach   Gabapentin Other (See Comments)    Suicidal ideation (03/23/18 - pt currently is getting small amount in pain pump with no reaction)   Hydroxyzine Other (See Comments)    hallucinations   Macrolides And Ketolides Swelling    Other reaction(s): Significant swelling   Midazolam Other (See Comments)    Bradycardia   Trazodone And Nefazodone Other (See Comments)    dizziness   Ambien [Zolpidem] Other (See Comments)    unknown   Band-Aid Infection Defense [Bacitracin-Polymyxin B] Swelling    redness   Latex     Other reaction(s): rash   Zolpidem Tartrate     Other reaction(s): "feels crazy"   Escitalopram Oxalate Other (See Comments)    confusion   Penicillins Rash    DID THE REACTION INVOLVE: Swelling of the face/tongue/throat, SOB, or low BP? No Sudden or severe rash/hives, skin peeling, or the inside of the mouth or nose? Yes Did it require medical treatment? Yes When did it last happen?     75 years old  If all above answers are "NO",  may proceed with cephalosporin use.   Vancomycin Swelling and Rash    Family History  Problem Relation Age of Onset   Heart disease Mother    Alzheimer's disease Mother    Kidney disease Father    Melanoma Sister    Heart disease Brother      Prior to Admission medications   Medication Sig Start Date End Date Taking? Authorizing Provider  acetaminophen (TYLENOL) 500 MG tablet Take 1,000 mg by mouth every 6 (six) hours as needed for moderate pain.   Yes [provider]  aspirin EC 81 MG tablet Take 81 mg by mouth daily as needed for mild pain.   Yes [provider]  buPROPion (WELLBUTRIN XL) 150 MG 24 hr tablet Take 150 mg by mouth daily.    Yes [provider]  busPIRone (BUSPAR) 5 MG tablet Take 5 mg by mouth 2 (two) times daily. 06/04/20  Yes [provider]  calcium carbonate (TUMS - DOSED IN MG ELEMENTAL CALCIUM) 500 MG chewable tablet Chew 500 mg by mouth daily as  needed for indigestion or heartburn.   Yes [provider]  cetirizine (ZYRTEC) 10 MG tablet Take 10 mg by mouth daily.   Yes [provider]  cycloSPORINE (RESTASIS) 0.05 % ophthalmic emulsion Place 1 drop into both eyes 2 (two) times daily.   Yes [provider]  dimenhyDRINATE (DRAMAMINE) 50 MG tablet Take 50 mg by mouth every 8 (eight) hours as needed for nausea or dizziness.   Yes [provider]  diphenhydrAMINE (BENADRYL) 25 MG tablet Take 25 mg by mouth every 6 (six) hours as needed for itching or sleep.   Yes [provider]  fluticasone (FLONASE) 50 MCG/ACT nasal spray Place 1 spray into both nostrils daily as needed for allergies or rhinitis.   Yes [provider]  furosemide (LASIX) 20 MG tablet Take 20 mg by mouth 2 (two) times daily as needed for fluid.   Yes [provider]  glycerin SUPP Place 1 Chip rectally daily as needed for moderate constipation.   Yes [provider]  HYDROmorphone (DILAUDID) 2 MG  tablet Take 2 mg by mouth every 4 (four) hours as needed for severe pain.   Yes [provider]  levETIRAcetam (KEPPRA) 250 MG tablet Take 250 mg by mouth 2 (two) times daily.  12/17/19 09/23/20 Yes [provider]  Lidocaine-Glycerin (PREPARATION H EX) Apply 1 application topically daily.   Yes [provider]  magnesium gluconate (MAGONATE) 500 MG tablet Take 500 mg by mouth daily.   Yes [provider]  melatonin 5 MG TABS Take 15 mg by mouth at bedtime.   Yes [provider]  methadone (DOLOPHINE) 10 MG tablet Take 10 mg by mouth every 6 (six) hours.   Yes [provider]  midodrine (PROAMATINE) 10 MG tablet Take 10 mg by mouth 3 (three) times daily.   Yes [provider]  mirtazapine (REMERON) 15 MG tablet Take 7.5 mg by mouth at bedtime.   Yes [provider]  Multiple Vitamin (MULTIVITAMIN WITH MINERALS) TABS tablet Take 1 tablet by mouth daily.   Yes [provider]  naloxone (NARCAN) nasal spray 4 mg/0.1 mL Place 1 spray into the nose once as needed (opiod overdose).   Yes [provider]  naproxen (NAPROSYN) 500 MG tablet Take 500 mg by mouth 2 (two) times daily.   Yes [provider]  NON FORMULARY 1 Dose by Implant route continuous. Intrathecal pump - Gabapentin - 22.107 mg /day + clonidine 110.54 mcg /day   Yes [provider]  omeprazole (PRILOSEC) 20 MG capsule Take 20 mg by mouth daily.   Yes [provider]  ondansetron (ZOFRAN) 8 MG tablet Take 8 mg by mouth every 8 (eight) hours as needed for nausea or vomiting.   Yes [provider]  PARoxetine (PAXIL) 10 MG tablet Take 10 mg by mouth daily.    Yes [provider]  Polyethyl Glycol-Propyl Glycol (SYSTANE HYDRATION PF OP) Place 1 drop into both eyes 2 (two) times daily.   Yes [provider]  polyethylene glycol (MIRALAX / GLYCOLAX) 17 g packet Take 17 g by mouth daily.   Yes [provider]  prednisoLONE acetate (PRED FORTE) 1 % ophthalmic suspension Place 1 drop into the left eye 4 (four) times daily.   Yes [provider]  Suvorexant (BELSOMRA) 10 MG TABS Take 10 mg by mouth daily.   Yes [provider]  tiZANidine (ZANAFLEX) 4 MG tablet Take 4 mg by mouth 3 (three)  times daily as needed for muscle spasms.    Yes [provider]  triamcinolone (KENALOG) 0.025 % cream Apply 1 application topically 2 (two) times daily.   Yes [provider]    Physical Exam: Vitals:   09/23/20 1715 09/23/20 1815 09/23/20 1945 09/23/20 2101  BP: 131/78 106/67 (!) 122/58 99/74  Pulse: 66 (!) 55 62 63  Resp: 15 14 15 18   Temp:   (S) (!) 92 F (33.3 C)   TempSrc:   Rectal   SpO2: 97% 90% 98% 97%    Constitutional: NAD, calm, comfortable, thin elderly female laying flat in bed with sunglasses and eating lollipop  Vitals:   09/23/20 1715 09/23/20 1815 09/23/20 1945 09/23/20 2101  BP: 131/78 106/67 (!) 122/58 99/74  Pulse: 66 (!) 55 62 63  Resp: 15 14 15 18   Temp:   (S) (!) 92 F (33.3 C)   TempSrc:   Rectal   SpO2: 97% 90% 98% 97%   Eyes: PERRL, lids and conjunctivae normal ENMT: Mucous membranes are moist.  Neck: normal, supple Respiratory: clear to auscultation bilaterally, no wheezing, no crackles. Normal respiratory effort. No accessory muscle use.  Cardiovascular: Regular rate and rhythm, no murmurs / rubs / gallops. No extremity edema. 2+ pedal pulses.  Abdomen: no tenderness, no masses palpated.  Bowel sounds positive.  Musculoskeletal: no clubbing / cyanosis. No joint deformity upper and lower extremities. Good ROM, no contractures. Normal muscle tone.  Skin: Erythema of hands and bilateral lower feet  neurologic: CN 2-12 grossly intact. Sensation intact,  Strength 5/5 in all 4.  Psychiatric: Normal judgment and insight. Alert and oriented x 3. Normal mood.    Labs on Admission: I have personally reviewed following labs and  imaging studies  CBC: Recent Labs  Lab 09/23/20 1328  WBC 9.1  NEUTROABS 7.0  HGB 7.6*  HCT 28.3*  MCV 91.3  PLT 431   Basic Metabolic Panel: Recent Labs  Lab 09/23/20 1328  NA 140  K 4.6  CL 101  CO2 33*  GLUCOSE 90  BUN 46*  CREATININE 1.64*  CALCIUM 8.9   GFR: CrCl cannot be calculated (Unknown ideal weight.). Liver Function Tests: Recent Labs  Lab 09/23/20 1328  AST 21  ALT 16  ALKPHOS 139*  BILITOT 0.7  PROT 6.1*  ALBUMIN 3.2*   No results for input(s): LIPASE, AMYLASE in the last 168 hours. No results for input(s): AMMONIA in the last 168 hours. Coagulation Profile: No results for input(s): INR, PROTIME in the last 168 hours. Cardiac Enzymes: No results for input(s): CKTOTAL, CKMB, CKMBINDEX, TROPONINI in the last 168 hours. BNP (last 3 results) No results for input(s): PROBNP in the last 8760 hours. HbA1C: No results for input(s): HGBA1C in the last 72 hours. CBG: No results for input(s): GLUCAP in the last 168 hours. Lipid Profile: No results for input(s): CHOL, HDL, LDLCALC, TRIG, CHOLHDL, LDLDIRECT in the last 72 hours. Thyroid Function Tests: No results for input(s): TSH, T4TOTAL, FREET4, T3FREE, THYROIDAB in the last 72 hours. Anemia Panel: No results for input(s): VITAMINB12, FOLATE, FERRITIN, TIBC, IRON, RETICCTPCT in the last 72 hours. Urine analysis:    Component Value Date/Time   COLORURINE AMBER (A) 06/20/2020 1150   APPEARANCEUR HAZY (A) 06/20/2020 1150   LABSPEC 1.019 06/20/2020 1150   PHURINE 5.0 06/20/2020 1150   GLUCOSEU NEGATIVE 06/20/2020 1150   HGBUR NEGATIVE 06/20/2020 1150   BILIRUBINUR NEGATIVE 06/20/2020 1150   KETONESUR 20 (A) 06/20/2020 1150   PROTEINUR 30 (  A) 06/20/2020 1150   NITRITE NEGATIVE 06/20/2020 [place] 06/20/2020 1150    Radiological Exams on Admission: No results found.    Assessment/Plan  Acute symptomatic anemia -Hgb of 7.6 and symptomatic with dyspnea and now  hypothermia.FOBT positive.  Will transfuse 1 unit pRBC.  -Hgb has been downward trending since 01/2020-Hgb around 11 at that time. - Reportedly had EGD and colonoscopy with [facility] around 2018 that was normal except for 1 noncancerous polyp removal -[facility] has been notified by ED physician and will see in consultation in the morning  -keep NPO past midnight  -check iron panel, vitamin P94 and folic  Hypothermia -bair hugger -possibly secondary to anemia. No clear signs of infection  AKI on CKD 2 -follow lab after blood transfusion  Chronic diastolic HF  -appears evolemic   Chronic pain secondary to erythromelaglia -continue intrathecal gabapentin and clonidine pump  Anxiety/depression Continue Bupropion , Buspar , Paroxetine daily  Chronic pain syndrome Methadone 10 mg PO q6h, Dilaudid 2 mg p.o. every 4 hours as needed severe pain,  Hold Naproxen due to AKI  Insomnia continue Suvorexant, melatonin ,Remeron   DVT prophylaxis:.SCD Code Status: DNR Family Communication: Plan discussed with patient and brother-in-law at bedside  disposition Plan: Home with observation Consults called:  Admission status: Observation   Level of care: Med-Surg  Status is: Observation  The patient remains OBS appropriate and will d/c before 2 midnights.  Dispo: The patient is from: Home              Anticipated d/c is to: Home              Patient currently is not medically stable to d/c.   Difficult to place patient No         Orene Desanctis DO [facility]   If 7PM-7AM, please contact night-coverage [email or web address]   09/23/2020, 9:15 PM

## 2020-09-23 NOTE — ED Provider Notes (Signed)
Emergency Medicine Provider Triage Evaluation Note  Kelly Curtis , a 75 y.o. female  was evaluated in triage.  Pt complains of anemia with a hemoglobin of 7.2.  States she does have a history of anemia, required transfusion about a year ago.  She states she has a history of hemorrhoids and has been having bright red blood which she thinks is the cause of her anemia.  She endorses dizziness and shortness of breath with exertion, but she also states that she does have vertigo and has been working with a physical therapist modified Epley maneuver.  She was agreeable to blood transfusion today.  No abdominal pain, hematemesis  Review of Systems  Positive: As above Negative: As above  Physical Exam  BP 100/67 (BP Location: Right Arm)   Pulse 68   Resp 18   SpO2 96%  Gen:   Awake, no distress   Resp:  Normal effort  MSK:   Moves extremities without difficulty  Other:  Pale appearing  Medical Decision Making  Medically screening exam initiated at 12:53 PM.  Appropriate orders placed.  Bernadette Gores was informed that the remainder of the evaluation will be completed by another provider, this initial triage assessment does not replace that evaluation, and the importance of remaining in the ED until their evaluation is complete.     Garald Balding, PA-C 09/23/20 1256    Gareth Morgan, MD 09/24/20 [phone]

## 2020-09-23 NOTE — Progress Notes (Signed)
Informed by unit charge nurse, Gershon Mussel, that patient has order for bair hugger device due to temperature of 92 F. Went to bedside to see patient. She is alert, oriented, and reports chronic low temperatures with associated pain in extremities. Patient oral temp is still around the 92 F range. Patient otherwise, shows no signs of distress or discomfort. Patient states she just doesn't want her feet covered as that elicits pain. Bair hugger placed, educated Clinical cytogeneticist. Will continue to monitor patient condition and re-evaluate warming needs.

## 2020-09-24 ENCOUNTER — Encounter (HOSPITAL_COMMUNITY)
Admission: EM | Disposition: A | Discharge status: Home / Self Care | Payer: Self-pay | Source: Home / Self Care | Attending: Internal Medicine

## 2020-09-24 ENCOUNTER — Inpatient Hospital Stay (HOSPITAL_COMMUNITY): Payer: Medicare Other | Admitting: Anesthesiology

## 2020-09-24 ENCOUNTER — Encounter (HOSPITAL_COMMUNITY): Payer: Self-pay | Admitting: Internal Medicine

## 2020-09-24 DIAGNOSIS — Z79890 Hormone replacement therapy (postmenopausal): Secondary | ICD-10-CM | POA: Diagnosis not present

## 2020-09-24 DIAGNOSIS — Z85828 Personal history of other malignant neoplasm of skin: Secondary | ICD-10-CM | POA: Diagnosis not present

## 2020-09-24 DIAGNOSIS — K5731 Diverticulosis of large intestine without perforation or abscess with bleeding: Secondary | ICD-10-CM | POA: Diagnosis not present

## 2020-09-24 DIAGNOSIS — K2901 Acute gastritis with bleeding: Secondary | ICD-10-CM | POA: Diagnosis present

## 2020-09-24 DIAGNOSIS — Z66 Do not resuscitate: Secondary | ICD-10-CM | POA: Diagnosis not present

## 2020-09-24 DIAGNOSIS — N179 Acute kidney failure, unspecified: Secondary | ICD-10-CM | POA: Diagnosis not present

## 2020-09-24 DIAGNOSIS — R195 Other fecal abnormalities: Secondary | ICD-10-CM | POA: Diagnosis not present

## 2020-09-24 DIAGNOSIS — Z20822 Contact with and (suspected) exposure to covid-19: Secondary | ICD-10-CM | POA: Diagnosis not present

## 2020-09-24 DIAGNOSIS — I5032 Chronic diastolic (congestive) heart failure: Secondary | ICD-10-CM | POA: Diagnosis not present

## 2020-09-24 DIAGNOSIS — R1084 Generalized abdominal pain: Secondary | ICD-10-CM | POA: Diagnosis not present

## 2020-09-24 DIAGNOSIS — Z79899 Other long term (current) drug therapy: Secondary | ICD-10-CM | POA: Diagnosis not present

## 2020-09-24 DIAGNOSIS — K2970 Gastritis, unspecified, without bleeding: Secondary | ICD-10-CM | POA: Diagnosis not present

## 2020-09-24 DIAGNOSIS — D649 Anemia, unspecified: Secondary | ICD-10-CM

## 2020-09-24 DIAGNOSIS — Z820 Family history of epilepsy and other diseases of the nervous system: Secondary | ICD-10-CM | POA: Diagnosis not present

## 2020-09-24 DIAGNOSIS — Z7982 Long term (current) use of aspirin: Secondary | ICD-10-CM | POA: Diagnosis not present

## 2020-09-24 DIAGNOSIS — K649 Unspecified hemorrhoids: Secondary | ICD-10-CM | POA: Diagnosis not present

## 2020-09-24 DIAGNOSIS — K2971 Gastritis, unspecified, with bleeding: Secondary | ICD-10-CM | POA: Diagnosis not present

## 2020-09-24 DIAGNOSIS — K922 Gastrointestinal hemorrhage, unspecified: Secondary | ICD-10-CM | POA: Diagnosis not present

## 2020-09-24 DIAGNOSIS — D500 Iron deficiency anemia secondary to blood loss (chronic): Secondary | ICD-10-CM | POA: Diagnosis not present

## 2020-09-24 DIAGNOSIS — Z808 Family history of malignant neoplasm of other organs or systems: Secondary | ICD-10-CM | POA: Diagnosis not present

## 2020-09-24 DIAGNOSIS — E860 Dehydration: Secondary | ICD-10-CM | POA: Diagnosis not present

## 2020-09-24 DIAGNOSIS — F418 Other specified anxiety disorders: Secondary | ICD-10-CM | POA: Diagnosis not present

## 2020-09-24 DIAGNOSIS — K648 Other hemorrhoids: Secondary | ICD-10-CM | POA: Diagnosis not present

## 2020-09-24 DIAGNOSIS — Z9049 Acquired absence of other specified parts of digestive tract: Secondary | ICD-10-CM | POA: Diagnosis not present

## 2020-09-24 DIAGNOSIS — G4700 Insomnia, unspecified: Secondary | ICD-10-CM | POA: Diagnosis present

## 2020-09-24 DIAGNOSIS — R634 Abnormal weight loss: Secondary | ICD-10-CM | POA: Diagnosis not present

## 2020-09-24 DIAGNOSIS — G894 Chronic pain syndrome: Secondary | ICD-10-CM | POA: Diagnosis present

## 2020-09-24 DIAGNOSIS — N182 Chronic kidney disease, stage 2 (mild): Secondary | ICD-10-CM | POA: Diagnosis not present

## 2020-09-24 DIAGNOSIS — K219 Gastro-esophageal reflux disease without esophagitis: Secondary | ICD-10-CM | POA: Diagnosis not present

## 2020-09-24 DIAGNOSIS — D62 Acute posthemorrhagic anemia: Secondary | ICD-10-CM | POA: Diagnosis not present

## 2020-09-24 DIAGNOSIS — R1310 Dysphagia, unspecified: Secondary | ICD-10-CM | POA: Diagnosis present

## 2020-09-24 DIAGNOSIS — K921 Melena: Secondary | ICD-10-CM | POA: Diagnosis not present

## 2020-09-24 DIAGNOSIS — Z8249 Family history of ischemic heart disease and other diseases of the circulatory system: Secondary | ICD-10-CM | POA: Diagnosis not present

## 2020-09-24 DIAGNOSIS — K5730 Diverticulosis of large intestine without perforation or abscess without bleeding: Secondary | ICD-10-CM | POA: Diagnosis not present

## 2020-09-24 DIAGNOSIS — N189 Chronic kidney disease, unspecified: Secondary | ICD-10-CM | POA: Diagnosis not present

## 2020-09-24 DIAGNOSIS — I7381 Erythromelalgia: Secondary | ICD-10-CM | POA: Diagnosis not present

## 2020-09-24 HISTORY — DX: Gastrointestinal hemorrhage, unspecified: K92.2

## 2020-09-24 HISTORY — PX: ESOPHAGOGASTRODUODENOSCOPY (EGD) WITH PROPOFOL: SHX5813

## 2020-09-24 LAB — CBC
HCT: 23.5 % — ABNORMAL LOW (ref 36.0–46.0)
HCT: 31 % — ABNORMAL LOW (ref 36.0–46.0)
Hemoglobin: 6.6 g/dL — CL (ref 12.0–15.0)
Hemoglobin: 9.4 g/dL — ABNORMAL LOW (ref 12.0–15.0)
MCH: 25.8 pg — ABNORMAL LOW (ref 26.0–34.0)
MCH: 26.9 pg (ref 26.0–34.0)
MCHC: 28.1 g/dL — ABNORMAL LOW (ref 30.0–36.0)
MCHC: 30.3 g/dL (ref 30.0–36.0)
MCV: 91.8 fL (ref 80.0–100.0)
MCV: 88.6 fL (ref 80.0–100.0)
Platelets: 330 10*3/uL (ref 150–400)
Platelets: 331 10*3/uL (ref 150–400)
RBC: 2.56 MIL/uL — ABNORMAL LOW (ref 3.87–5.11)
RBC: 3.5 MIL/uL — ABNORMAL LOW (ref 3.87–5.11)
RDW: 23 % — ABNORMAL HIGH (ref 11.5–15.5)
RDW: 21.1 % — ABNORMAL HIGH (ref 11.5–15.5)
WBC: 4 10*3/uL (ref 4.0–10.5)
WBC: 5.4 10*3/uL (ref 4.0–10.5)
nRBC: 0 % (ref 0.0–0.2)
nRBC: 0.4 % — ABNORMAL HIGH (ref 0.0–0.2)

## 2020-09-24 LAB — URINALYSIS, ROUTINE W REFLEX MICROSCOPIC
Bilirubin Urine: NEGATIVE
Glucose, UA: NEGATIVE mg/dL
Hgb urine dipstick: NEGATIVE
Ketones, ur: NEGATIVE mg/dL
Nitrite: NEGATIVE
Protein, ur: NEGATIVE mg/dL
Specific Gravity, Urine: 1.014 (ref 1.005–1.030)
pH: 6 (ref 5.0–8.0)

## 2020-09-24 LAB — IRON AND TIBC
Iron: 24 ug/dL — ABNORMAL LOW (ref 28–170)
Saturation Ratios: 6 % — ABNORMAL LOW (ref 10.4–31.8)
TIBC: 433 ug/dL (ref 250–450)
UIBC: 409 ug/dL

## 2020-09-24 LAB — FOLATE: Folate: 7.5 ng/mL (ref >5.9)

## 2020-09-24 LAB — VITAMIN B12: Vitamin B-12: 332 pg/mL (ref 180–914)

## 2020-09-24 LAB — PREPARE RBC (CROSSMATCH)

## 2020-09-24 SURGERY — ESOPHAGOGASTRODUODENOSCOPY (EGD) WITH PROPOFOL
Anesthesia: Monitor Anesthesia Care

## 2020-09-24 MED ORDER — LIDOCAINE HCL (CARDIAC) PF 100 MG/5ML IV SOSY
PREFILLED_SYRINGE | INTRAVENOUS | Status: DC | PRN
  Administered 2020-09-24: 100 mg via INTRAVENOUS

## 2020-09-24 MED ORDER — SODIUM CHLORIDE 0.9 % IV SOLN: INTRAVENOUS | Status: DC

## 2020-09-24 MED ORDER — PROPOFOL 500 MG/50ML IV EMUL
INTRAVENOUS | Status: DC | PRN
  Administered 2020-09-24: 100 ug/kg/min via INTRAVENOUS

## 2020-09-24 MED ORDER — MIDODRINE HCL 5 MG PO TABS
5.0000 mg | ORAL_TABLET | Freq: Three times a day (TID) | ORAL | Status: DC
  Administered 2020-09-24 – 2020-09-26 (×3): 5 mg via ORAL
  Filled 2020-09-24 (×4): qty 1

## 2020-09-24 MED ORDER — PEG 3350-KCL-NA BICARB-NACL 420 G PO SOLR
4000.0000 mL | Freq: Once | ORAL | Status: AC
  Administered 2020-09-24: 4000 mL via ORAL
  Filled 2020-09-24: qty 4000

## 2020-09-24 MED ORDER — CHLORHEXIDINE GLUCONATE CLOTH 2 % EX PADS
6.0000 | MEDICATED_PAD | Freq: Every day | CUTANEOUS | Status: DC
  Administered 2020-09-24 – 2020-09-26 (×2): 6 via TOPICAL

## 2020-09-24 MED ORDER — SODIUM CHLORIDE 0.9% IV SOLUTION: Freq: Once | INTRAVENOUS | Status: AC

## 2020-09-24 MED ORDER — SODIUM CHLORIDE 0.9 % IV SOLN: INTRAVENOUS | Status: DC | PRN

## 2020-09-24 MED ORDER — DEXMEDETOMIDINE (PRECEDEX) IN NS 20 MCG/5ML (4 MCG/ML) IV SYRINGE
PREFILLED_SYRINGE | INTRAVENOUS | Status: DC | PRN
  Administered 2020-09-24 (×4): 4 ug via INTRAVENOUS

## 2020-09-24 MED ORDER — PANTOPRAZOLE SODIUM 40 MG IV SOLR
40.0000 mg | Freq: Two times a day (BID) | INTRAVENOUS | Status: DC
  Administered 2020-09-24 – 2020-09-26 (×4): 40 mg via INTRAVENOUS
  Filled 2020-09-24 (×4): qty 40

## 2020-09-24 MED ORDER — PROPOFOL 10 MG/ML IV BOLUS
INTRAVENOUS | Status: DC | PRN
  Administered 2020-09-24: 20 mg via INTRAVENOUS

## 2020-09-24 SURGICAL SUPPLY — 15 items

## 2020-09-24 NOTE — Op Note (Signed)
[facility] Patient Name: Kelly Curtis Procedure Date: 09/24/2020 MRN: [id number] Attending MD: Arta Silence , MD Date of Birth: Feb 24, 1946 CSN: [id number] Age: 75 Admit Type: Inpatient Procedure:                Upper GI endoscopy Indications:              Generalized abdominal pain, Acute post hemorrhagic                            anemia, Recent gastrointestinal bleeding Providers:                Arta Silence, MD, Burtis Junes, RN, Tyna Jaksch                            Technician Referring MD:             [facility] Medicines:                Monitored Anesthesia Care Complications:            No immediate complications. Estimated Blood Loss:     Estimated blood loss: none. Procedure:                Pre-Anesthesia Assessment:                           - Prior to the procedure, a History and Physical                            was performed, and patient medications and                            allergies were reviewed. The patient's tolerance of                            previous anesthesia was also reviewed. The risks                            and benefits of the procedure and the sedation                            options and risks were discussed with the patient.                            All questions were answered, and informed consent                            was obtained. Prior Anticoagulants: The patient has                            taken no previous anticoagulant or antiplatelet                            agents. ASA Grade Assessment: III - A patient with  severe systemic disease. After reviewing the risks                            and benefits, the patient was deemed in                            satisfactory condition to undergo the procedure.                           After obtaining informed consent, the endoscope was                            passed under direct vision. Throughout the                             procedure, the patient's blood pressure, pulse, and                            oxygen saturations were monitored continuously. The                            [id number] ([id number]) was introduced through the                            mouth, and advanced to the second part of duodenum.                            The upper GI endoscopy was accomplished without                            difficulty. The patient tolerated the procedure                            well. Scope In: Scope Out: Findings:      The examined esophagus was normal.      Patchy mild inflammation was found in the gastric body, in the gastric       antrum and in the prepyloric region of the stomach.      The exam of the stomach was otherwise normal.      The duodenal bulb, first portion of the duodenum and second portion of       the duodenum were normal. Impression:               - Normal esophagus.                           - Gastritis.                           - Normal duodenal bulb, first portion of the                            duodenum and second portion of the duodenum.                           -  No explanation for anemia seen on today's                            endoscopy. Moderate Sedation:      None Recommendation:           - Return patient to hospital ward for ongoing care.                           - Clear liquid diet today.                           - Continue present medications.                           - Perform a colonoscopy tomorrow. Procedure Code(s):        --- Professional ---                           [id number], Esophagogastroduodenoscopy, flexible,                            transoral; diagnostic, including collection of                            specimen(s) by brushing or washing, when performed                            (separate procedure) Diagnosis Code(s):        --- Professional ---                           K29.70, Gastritis, unspecified, without bleeding                            R10.84, Generalized abdominal pain                           D62, Acute posthemorrhagic anemia                           K92.2, Gastrointestinal hemorrhage, unspecified CPT copyright 2019 [employer]. All rights reserved. The codes documented in this report are preliminary and upon coder review may  be revised to meet current compliance requirements. Arta Silence, MD 09/24/2020 2:05:14 PM This report has been signed electronically. Number of Addenda: 0

## 2020-09-24 NOTE — H&P (View-Only) (Signed)
Referring Provider: Dr. Alvino Chapel Primary Care Physician:  [name], MD Primary Gastroenterologist:  Dr. Therisa Doyne  Reason for Consultation:  Anemia; Heme positive  HPI: Kelly Curtis is a 75 y.o. female seen for a consult due to anemia and heme positive stool but during my evaluation she reports several months of bright red blood per rectum in the stool and with wiping. Reports rectal pain, burning, and itching. Three days of diarrhea but none since admit and denies any bleeding since admit. Has been having solid and liquid food dysphagia for 6 weeks. Hgb 6.6. Has been on Naprosyn chronically. EGD in 2018 showed gastritis. Colonoscopy in 09/2016 showed sigmoid diverticulosis and small internal hemorrhoids; S/P ERCP in 2021 for CBD stone extraction. Patient transferred to ICU from floor when found to be hypothermic. Nurses in room.    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    Past Surgical History:  Procedure Laterality Date   AUGMENTATION MAMMAPLASTY Bilateral    CHOLECYSTECTOMY N/A 12/12/2019   Procedure: LAPAROSCOPIC CHOLECYSTECTOMY;  Surgeon: [name], MD;  Location: [facility];  Service: General;  Laterality: N/A;   COLONOSCOPY     ENDOSCOPIC RETROGRADE CHOLANGIOPANCREATOGRAPHY (ERCP) WITH PROPOFOL N/A 09/17/2019   Procedure: ENDOSCOPIC RETROGRADE CHOLANGIOPANCREATOGRAPHY (ERCP) WITH PROPOFOL;  Surgeon: Clarene Essex, MD;  [facility];  Service: Endoscopy;   Laterality: N/A;   ESOPHAGOGASTRODUODENOSCOPY (EGD) WITH PROPOFOL N/A 09/06/2019   Procedure: ESOPHAGOGASTRODUODENOSCOPY (EGD) WITH PROPOFOL;  Surgeon: Ronnette Juniper, MD;  [facility];  Service: Gastroenterology;  Laterality: N/A;   EYE SURGERY Bilateral    cataract   IRRIGATION AND DEBRIDEMENT ABSCESS N/A 02/10/2020   Procedure: IRRIGATION AND DEBRIDEMENT ABSCESS;  Surgeon: Clovis Riley, MD;  Location: [facility];  Service: General;  Laterality: N/A;   MITRAL VALVE REPAIR     REMOVAL OF STONES  09/17/2019   Procedure: REMOVAL OF STONES;  Surgeon: Clarene Essex, MD;  [facility];  Service: Endoscopy;;   SPHINCTEROTOMY  09/17/2019   Procedure: [name];  Surgeon: Clarene Essex, MD;  [facility];  Service: Endoscopy;;   TOTAL SHOULDER ARTHROPLASTY Right 2013    Prior to Admission medications   Medication Sig Start Date End Date Taking? Authorizing Provider  acetaminophen (TYLENOL) 500 MG tablet Take 1,000 mg by mouth every 6 (six) hours as needed for moderate pain.   Yes [provider]  aspirin EC 81 MG tablet Take 81 mg by mouth daily as needed for mild pain.   Yes [provider]  buPROPion (WELLBUTRIN XL) 150 MG 24 hr tablet Take 150 mg by mouth daily.    Yes [provider]  busPIRone (BUSPAR) 5 MG tablet Take 5 mg by mouth 2 (two) times daily. 06/04/20  Yes [provider]  calcium carbonate (TUMS - DOSED IN MG ELEMENTAL CALCIUM) 500 MG  chewable tablet Chew 500 mg by mouth daily as needed for indigestion or heartburn.   Yes [provider]  cetirizine (ZYRTEC) 10 MG tablet Take 10 mg by mouth daily.   Yes [provider]  cycloSPORINE (RESTASIS) 0.05 % ophthalmic emulsion Place 1 drop into both eyes 2 (two) times daily.   Yes [provider]  dimenhyDRINATE (DRAMAMINE) 50 MG tablet Take 50 mg by mouth every 8 (eight) hours as needed for nausea or dizziness.   Yes [provider]   diphenhydrAMINE (BENADRYL) 25 MG tablet Take 25 mg by mouth every 6 (six) hours as needed for itching or sleep.   Yes [provider]  fluticasone (FLONASE) 50 MCG/ACT nasal spray Place 1 spray into both nostrils daily as needed for allergies or rhinitis.   Yes [provider]  furosemide (LASIX) 20 MG tablet Take 20 mg by mouth 2 (two) times daily as needed for fluid.   Yes [provider]  glycerin SUPP Place 1 Chip rectally daily as needed for moderate constipation.   Yes [provider]  HYDROmorphone (DILAUDID) 2 MG tablet Take 2 mg by mouth every 4 (four) hours as needed for severe pain.   Yes [provider]  levETIRAcetam (KEPPRA) 250 MG tablet Take 250 mg by mouth 2 (two) times daily.  12/17/19 09/23/20 Yes [provider]  Lidocaine-Glycerin (PREPARATION H EX) Apply 1 application topically daily.   Yes [provider]  magnesium gluconate (MAGONATE) 500 MG tablet Take 500 mg by mouth daily.   Yes [provider]  melatonin 5 MG TABS Take 15 mg by mouth at bedtime.   Yes [provider]  methadone (DOLOPHINE) 10 MG tablet Take 10 mg by mouth every 6 (six) hours.   Yes [provider]  midodrine (PROAMATINE) 10 MG tablet Take 10 mg by mouth 3 (three) times daily.   Yes [provider]  mirtazapine (REMERON) 15 MG tablet Take 7.5 mg by mouth at bedtime.   Yes [provider]  Multiple Vitamin (MULTIVITAMIN WITH MINERALS) TABS tablet Take 1 tablet by mouth daily.   Yes [provider]  naloxone (NARCAN) nasal spray 4 mg/0.1 mL Place 1 spray into the nose once as needed (opiod overdose).   Yes [provider]  naproxen (NAPROSYN) 500 MG tablet Take 500 mg by mouth 2 (two) times daily.   Yes [provider]  NON FORMULARY 1 Dose by Implant route continuous. Intrathecal pump - Gabapentin - 22.107 mg /day + clonidine 110.54 mcg /day   Yes [provider]   omeprazole (PRILOSEC) 20 MG capsule Take 20 mg by mouth daily.   Yes [provider]  ondansetron (ZOFRAN) 8 MG tablet Take 8 mg by mouth every 8 (eight) hours as needed for nausea or vomiting.   Yes [provider]  PARoxetine (PAXIL) 10 MG tablet Take 10 mg by mouth daily.    Yes [provider]  Polyethyl Glycol-Propyl Glycol (SYSTANE HYDRATION PF OP) Place 1 drop into both eyes 2 (two) times daily.   Yes [provider]  polyethylene glycol (MIRALAX / GLYCOLAX) 17 g packet Take 17 g by mouth daily.   Yes [provider]  prednisoLONE acetate (PRED FORTE) 1 % ophthalmic suspension Place 1 drop into the left eye 4 (four) times daily.   Yes [provider]  Suvorexant (BELSOMRA) 10 MG TABS Take 10 mg by mouth daily.   Yes [provider]  tiZANidine (ZANAFLEX) 4  MG tablet Take 4 mg by mouth 3 (three) times daily as needed for muscle spasms.    Yes [provider]  triamcinolone (KENALOG) 0.025 % cream Apply 1 application topically 2 (two) times daily.   Yes [provider]    Scheduled Meds:  sodium chloride   Intravenous Once   buPROPion  150 mg Oral Daily   busPIRone  5 mg Oral BID   Chlorhexidine Gluconate Cloth  6 each Topical Daily   cycloSPORINE  1 drop Both Eyes BID   levETIRAcetam  250 mg Oral BID   loratadine  10 mg Oral Daily   magnesium gluconate  500 mg Oral Daily   melatonin  15 mg Oral QHS   methadone  10 mg Oral Q6H   midodrine  10 mg Oral TID   mirtazapine  7.5 mg Oral QHS   pantoprazole (PROTONIX) IV  40 mg Intravenous Q12H   PARoxetine  10 mg Oral Daily   polyvinyl alcohol  1 drop Both Eyes BID   prednisoLONE acetate  1 drop Left Eye QID   Suvorexant  10 mg Oral Daily   Continuous Infusions:  sodium chloride     PRN Meds:.sodium chloride, diphenhydrAMINE, HYDROmorphone, tiZANidine  Allergies as of 09/23/2020 - Review Complete 09/23/2020  Allergen Reaction Noted   Pregabalin   03/13/2020   Demerol  [meperidine hcl] Nausea And Vomiting 02/08/2012   Ferrous sulfate Nausea And Vomiting 03/13/2020   Gabapentin Other (See Comments) 02/08/2012   Hydroxyzine Other (See Comments) 11/09/2015   Macrolides and ketolides Swelling 03/13/2020   Midazolam Other (See Comments) 02/08/2012   Trazodone and nefazodone Other (See Comments) 11/09/2015   Ambien [zolpidem] Other (See Comments) 06/15/2020   Band-aid infection defense [bacitracin-polymyxin b] Swelling 09/23/2020   Latex  06/15/2020   Zolpidem tartrate  09/15/2020   Escitalopram oxalate Other (See Comments) 08/11/2015   Penicillins Rash 03/23/2018   Vancomycin Swelling and Rash 03/23/2018    Family History  Problem Relation Age of Onset   Heart disease Mother    Alzheimer's disease Mother    Kidney disease Father    Melanoma Sister    Heart disease Brother     Social History   Socioeconomic History   Marital status: Single    Spouse name: Not on file   Number of children: Not on file   Years of education: Not on file   Highest education level: Not on file  Occupational History   Not on file  Tobacco Use   Smoking status: Former    Years: 32.00    Pack years: 0.00    Types: Cigarettes    Quit date: 2000    Years since quitting: 22.5   Smokeless tobacco: Never  Vaping Use   Vaping Use: Never used  Substance and Sexual Activity   Alcohol use: Never   Drug use: Never   Sexual activity: Not Currently  Other Topics Concern   Not on file  Social History Narrative   Not on file   Social Determinants of Health   Financial Resource Strain: Not on file  Food Insecurity: Not on file  Transportation Needs: Not on file  Physical Activity: Not on file  Stress: Not on file  Social Connections: Not on file  Intimate Partner Violence: Not on file    Review of Systems: All negative except as stated above in HPI.  Physical Exam: Vital signs: Vitals:   09/24/20 0824 09/24/20 0848  BP:  119/70   Pulse:  Resp:    Temp: (!) 95.2 F (35.1 C) (!) 95.2 F (35.1 C)  SpO2:    P 58 Last BM Date: 09/23/20 General:   Lethargic, elderly, thin, no acute distress  Head: normocephalic, atraumatic Eyes: anicteric sclera ENT: oropharynx clear Neck: supple, nontender Lungs:  Clear throughout to auscultation.   No wheezes, crackles, or rhonchi. No acute distress. Heart:  Regular rate and rhythm; no murmurs, clicks, rubs,  or gallops. Abdomen: soft, nontender, nondistended, +BS  Rectal:  Deferred Ext: no edema  GI:  Lab Results: Recent Labs    09/23/20 1328 09/24/20 0242  WBC 9.1 4.0  HGB 7.6* 6.6*  HCT 28.3* 23.5*  PLT 383 330   BMET Recent Labs    09/23/20 1328  NA 140  K 4.6  CL 101  CO2 33*  GLUCOSE 90  BUN 46*  CREATININE 1.64*  CALCIUM 8.9   LFT Recent Labs    09/23/20 1328  PROT 6.1*  ALBUMIN 3.2*  AST 21  ALT 16  ALKPHOS 139*  BILITOT 0.7   PT/INR No results for input(s): LABPROT, INR in the last 72 hours.   Studies/Results: No results found.  Impression/Plan: Symptomatic anemia and chronic BRBPR. Has been on Naprosyn daily chronically and needs an updated EGD to evaluate for peptic ulcer disease. NPO. Supportive care. EGD today by Dr. Paulita Fujita. If EGD unrevealing, then may need an updated colonoscopy during this admit with timing to be determined.    LOS: 0 days   Lear Ng  09/24/2020, 9:00 AM  Questions please call [phone]

## 2020-09-24 NOTE — Interval H&P Note (Signed)
History and Physical Interval Note:  09/24/2020 1:21 PM  Kelly Curtis  has presented today for surgery, with the diagnosis of GI bleed.  The various methods of treatment have been discussed with the patient and family. After consideration of risks, benefits and other options for treatment, the patient has consented to  Procedure(s): ESOPHAGOGASTRODUODENOSCOPY (EGD) WITH PROPOFOL (N/A) as a surgical intervention.  The patient's history has been reviewed, patient examined, no change in status, stable for surgery.  I have reviewed the patient's chart and labs.  Questions were answered to the patient's satisfaction.     [name]

## 2020-09-24 NOTE — TOC Initial Note (Signed)
Transition of Care [facility]) - Initial/Assessment Note    Patient Details  Name: Kelly Curtis MRN: [id number] Date of Birth: 10/16/1945  Transition of Care [facility]) CM/SW Contact:    Leeroy Cha, RN Phone Number: 09/24/2020, 8:11 AM  Clinical Narrative:                  75 y.o. 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   Patient reports that she saw her PCP yesterday and was found to have low hemoglobin on lab work of 7.3 and asked to present to the ED.  She has a history of chronic vertigo followed by ENT but states this is no worse.  However has been having increasing dyspnea with exertion ever since November 2021 following a hospitalization for abdominal wall abscess. Lab work also showed decreasing Hgb since then. She was hospitalized in March 2022 with acute pancreatitis and at that time had acute anemia with hemoglobin of 6.5 requiring PRBC transfusion and also 1 dose of Feraheme. She has noted several months of bright red blood with her bowel movements but she does have hemorrhoids. Had been nauseous since her cholecystectomy in September 2021.  Had an episode of diarrhea earlier this morning thinks it was due to pizza she had overnight.  Reportedly had colonoscopy and EGD by [facility] back in 2018 that had an otherwise noncancerous polyp removed. She is not on any blood thinners. [id number]: hgb 6.6 rec'ing one unit prbc. TOC PLAN OF CARE: following for progression and toc needs/ has pcp Expected Discharge Plan: Home/Self Care Barriers to Discharge: Continued Medical Work up   Patient Goals and CMS Choice Patient states their goals for this hospitalization and ongoing recovery are:: to go home CMS Medicare.gov Compare Post Acute Care list provided to::  Patient Choice offered to / list presented to : Patient  Expected Discharge Plan and Services Expected Discharge Plan: Home/Self Care   Discharge Planning Services: CM Consult   Living arrangements for the past 2 months: Single Family Home                                      Prior Living Arrangements/Services Living arrangements for the past 2 months: Single Family Home Lives with:: Self Patient language and need for interpreter reviewed:: Yes Do you feel safe going back to the place where you live?: Yes            Criminal Activity/Legal Involvement Pertinent to Current Situation/Hospitalization: No - Comment as needed  Activities of Daily Living Home Assistive Devices/Equipment: Eyeglasses, Bedside commode/3-in-1, Walker (specify type), Wheelchair, Shower chair with back ADL Screening (condition at time of admission) Patient's cognitive ability adequate to safely complete daily activities?: No Is the patient deaf or have difficulty hearing?: Yes Does the patient have difficulty seeing, even when wearing glasses/contacts?: Yes (hx bilateral catarct surgery , dry eyes) Does the patient have difficulty concentrating, remembering, or making decisions?: Yes Patient able to express need for assistance with ADLs?: Yes Does the patient have difficulty dressing or bathing?: Yes Independently performs ADLs?: No Communication: Independent Dressing (OT): Needs assistance Is this a change from baseline?: Pre-admission baseline Grooming: Independent Feeding: Independent Bathing: Needs assistance Is this a change from baseline?:  Pre-admission baseline Toileting: Needs assistance Is this a change from baseline?: Pre-admission baseline In/Out Bed: Needs assistance Is this a change from baseline?: Pre-admission baseline Walks in Home: Needs assistance Is this a change from baseline?: Pre-admission baseline Does the patient have difficulty walking or climbing stairs?:  Yes Weakness of Legs: Both Weakness of Arms/Hands: Both  Permission Sought/Granted                  Emotional Assessment Appearance:: Appears stated age Attitude/Demeanor/Rapport: Engaged Affect (typically observed): Calm Orientation: : Oriented to Place, Oriented to Self, Oriented to  Time, Oriented to Situation Alcohol / Substance Use: Not Applicable Psych Involvement: No (comment)  Admission diagnosis:  Acute blood loss anemia [D62] Symptomatic anemia [D64.9] Gastrointestinal hemorrhage, unspecified gastrointestinal hemorrhage type [K92.2] Acute GI bleeding [K92.2] Patient Active Problem List   Diagnosis Date Noted   Acute GI bleeding 09/24/2020   Acute blood loss anemia 09/23/2020   Hypothermia 09/23/2020   Acute kidney injury superimposed on chronic kidney disease ([place]) 09/23/2020   Insomnia 09/23/2020   Chronic diastolic CHF (congestive heart failure) ([place]) 06/16/2020   Iron deficiency anemia 06/16/2020   Abdominal wall abscess 02/09/2020   Chronic pain 02/09/2020   HTN (hypertension) 03/24/2018   Acute on chronic diastolic CHF (congestive heart failure) ([place]) 03/23/2018   Microcytic anemia 03/23/2018   Leg swelling 03/23/2018   Erythromelalgia ([facility])    GERD (gastroesophageal reflux disease)    Depression with anxiety    PCP:  [name], MD Pharmacy:   [id number] - [place], [place] - [address] AT [address] [place] [address] Phone: [phone] Fax: [phone]     Social Determinants of Health (SDOH) Interventions    Readmission Risk Interventions No flowsheet data found.

## 2020-09-24 NOTE — Anesthesia Postprocedure Evaluation (Signed)
Anesthesia Post Note  Patient: Kelly Curtis  Procedure(s) Performed: ESOPHAGOGASTRODUODENOSCOPY (EGD) WITH PROPOFOL     Patient location during evaluation: PACU Anesthesia Type: MAC Level of consciousness: awake and alert Pain management: pain level controlled Vital Signs Assessment: post-procedure vital signs reviewed and stable Respiratory status: spontaneous breathing, nonlabored ventilation, respiratory function stable and patient connected to nasal cannula oxygen Cardiovascular status: stable and blood pressure returned to baseline Postop Assessment: no apparent nausea or vomiting Anesthetic complications: no   No notable events documented.  Last Vitals:  Vitals:   09/24/20 1316 09/24/20 1402  BP: 134/73 (!) 84/42  Pulse: 61 (!) 58  Resp: 12 (!) 9  Temp:    SpO2: 91% 98%    Last Pain:  Vitals:   09/24/20 1402  TempSrc:   PainSc: Asleep                 Walt Geathers S

## 2020-09-24 NOTE — Anesthesia Preprocedure Evaluation (Signed)
Anesthesia Evaluation  Patient identified by MRN, date of birth, ID band Patient awake    Reviewed: Allergy & Precautions, NPO status , Patient's Chart, lab work & pertinent test results  Airway Mallampati: II  TM Distance: >3 FB Neck ROM: Full    Dental no notable dental hx.    Pulmonary neg pulmonary ROS, former smoker,    Pulmonary exam normal breath sounds clear to auscultation       Cardiovascular hypertension, Normal cardiovascular exam Rhythm:Regular Rate:Normal + Systolic murmurs S/P mitral annuloplasty   Neuro/Psych negative neurological ROS  negative psych ROS   GI/Hepatic negative GI ROS, Neg liver ROS,   Endo/Other  negative endocrine ROS  Renal/GU negative Renal ROS  negative genitourinary   Musculoskeletal negative musculoskeletal ROS (+)   Abdominal   Peds negative pediatric ROS (+)  Hematology  (+) anemia , Blood loss anemia   Anesthesia Other Findings   Reproductive/Obstetrics negative OB ROS                             Anesthesia Physical Anesthesia Plan  ASA: 3  Anesthesia Plan: MAC   Post-op Pain Management:    Induction: Intravenous  PONV Risk Score and Plan: 2 and Propofol infusion and Treatment may vary due to age or medical condition  Airway Management Planned: Simple Face Mask  Additional Equipment:   Intra-op Plan:   Post-operative Plan:   Informed Consent: I have reviewed the patients History and Physical, chart, labs and discussed the procedure including the risks, benefits and alternatives for the proposed anesthesia with the patient or authorized representative who has indicated his/her understanding and acceptance.     Dental advisory given  Plan Discussed with: CRNA and Surgeon  Anesthesia Plan Comments:         Anesthesia Quick Evaluation

## 2020-09-24 NOTE — Progress Notes (Signed)
    BRIEF OVERNIGHT PROGRESS REPORT  Notified by RN for continued pain despite medications.  Upon inquiry with patient for her description of pain she informs [place] that she has Erythromelalgia [facility]) as noted in the ER Physician history.  She informs [place] that she maintains a cool environment in her normal life to avoid pain of this (Erythromelalgia) condition. She states that her Temp is normally 38* F (oral) which is where she is most comfortable.  She describes her pain as mostly in her hands and feet and that she does actively cool them as needed daily . She normally avoids precipitating factors (high ambient heat, exercise) as much as possible in normal daily life and uses cooling to her most affected areas that he describes as her hands and feet and sometimes facial areas.  We will maintain the warming to achieve her stated normal of 29* F of her core. Cooling for limited periods of time will be used to alleviate the pain in her hands and feet. She maintains her home environment in the 25*s F normally. She states that this is her normal mode of treating her pain of this sort.  She is receiving blood at this point   As of the writing of this note she describes relief already with the use of cooling to her hands and feet.   Gershon Cull MSNA ACNPC-AG Acute Care Nurse Practitioner [place]

## 2020-09-24 NOTE — Progress Notes (Signed)
Chaplain followed up with patient when she returned from procedure.  Bedside was her sister, with whom she lived for six months after an operation.  They are very close.  Patient's mother died of Covid last year.  She is still grieving.  She lives at home with full time help from "[employer]."  Patient talked about how she wanted her suffering to end.  Chaplain provided ministry of presence, support and prayer. Chaplain prayed especially for her to get good rest. Rev. Tamsen Snider Pager [phone]

## 2020-09-24 NOTE — Progress Notes (Signed)
   09/23/20 2255  Assess: MEWS Score  Temp (!) 92.8 F (33.8 C)  BP (!) 111/56 (rechecked bp.)  Pulse Rate (!) 59  Resp 15  SpO2 96 %  Assess: MEWS Score  MEWS Temp 2  MEWS Systolic 0  MEWS Pulse 0  MEWS RR 0  MEWS LOC 0  MEWS Score 2  MEWS Score Color Yellow  Assess: if the MEWS score is Yellow or Red  Were vital signs taken at a resting state? Yes  Focused Assessment No change from prior assessment  Does the patient meet 2 or more of the SIRS criteria? No  MEWS guidelines implemented *See Row Information* No, previously yellow, continue vital signs every 4 hours  Treat  MEWS Interventions Escalated (See documentation below)  Pain Scale 0-10  Pain Score 0  Escalate  MEWS: Escalate Yellow: discuss with charge nurse/RN and consider discussing with provider and RRT  Notify: Charge Nurse/RN  Name of Charge Nurse/RN Notified Tom RN  Date Charge Nurse/RN Notified 09/23/20  Time Charge Nurse/RN Notified 2300  Notify: Provider  Provider Name/Title J.Olena Heckle  Date Provider Notified 09/23/20  Time Provider Notified 2330  Notification Type Face-to-face (on unit)  Notification Reason Other (Comment) (concerned about low temp and needing RBC transfusion)  Provider response See new orders  Date of Provider Response 09/23/20  Time of Provider Response 2345  Notify: Rapid Response  Name of Rapid Response RN Notified Marliss Czar  Date Rapid Response Notified 09/23/20  Time Rapid Response Notified 2202  Document  Patient Outcome Transferred/level of care increased  Progress note created (see row info) Yes  Assess: SIRS CRITERIA  SIRS Temperature  1  SIRS Pulse 0  SIRS Respirations  0  SIRS WBC 0  SIRS Score Sum  1  Patient remain in yellow MEWS d/t low temp. Enteric precautions implemented  d/t loose stools prior to admission. Tom charge nurse notified rapid response, see note from rapid response nurse. AC informed me and Tom charge nurse that pt need to be in step down  because bear huggers can not be on the unit. Clarene Essex NP notified while on the unit, he spoke with Dr. Flossie Buffy attending DO and patient will be moved to step down where she can be monitored and administered the RBCs. Will continue to monitor the patient.

## 2020-09-24 NOTE — Consult Note (Signed)
Referring Provider: Dr. Alvino Chapel Primary Care Physician:  [name], MD Primary Gastroenterologist:  Dr. Therisa Doyne  Reason for Consultation:  Anemia; Heme positive  HPI: Kelly Curtis is a 75 y.o. female seen for a consult due to anemia and heme positive stool but during my evaluation she reports several months of bright red blood per rectum in the stool and with wiping. Reports rectal pain, burning, and itching. Three days of diarrhea but none since admit and denies any bleeding since admit. Has been having solid and liquid food dysphagia for 6 weeks. Hgb 6.6. Has been on Naprosyn chronically. EGD in 2018 showed gastritis. Colonoscopy in 09/2016 showed sigmoid diverticulosis and small internal hemorrhoids; S/P ERCP in 2021 for CBD stone extraction. Patient transferred to ICU from floor when found to be hypothermic. Nurses in room.    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    Past Surgical History:  Procedure Laterality Date   AUGMENTATION MAMMAPLASTY Bilateral    CHOLECYSTECTOMY N/A 12/12/2019   Procedure: LAPAROSCOPIC CHOLECYSTECTOMY;  Surgeon: [name], MD;  Location: [place];  Service: General;  Laterality: N/A;   COLONOSCOPY     ENDOSCOPIC RETROGRADE CHOLANGIOPANCREATOGRAPHY (ERCP) WITH PROPOFOL N/A 09/17/2019   Procedure: ENDOSCOPIC RETROGRADE CHOLANGIOPANCREATOGRAPHY (ERCP) WITH PROPOFOL;  Surgeon: Clarene Essex, MD;  Location: [facility];  Service: Endoscopy;   Laterality: N/A;   ESOPHAGOGASTRODUODENOSCOPY (EGD) WITH PROPOFOL N/A 09/06/2019   Procedure: ESOPHAGOGASTRODUODENOSCOPY (EGD) WITH PROPOFOL;  Surgeon: Ronnette Juniper, MD;  Location: [facility];  Service: Gastroenterology;  Laterality: N/A;   EYE SURGERY Bilateral    cataract   IRRIGATION AND DEBRIDEMENT ABSCESS N/A 02/10/2020   Procedure: IRRIGATION AND DEBRIDEMENT ABSCESS;  Surgeon: Clovis Riley, MD;  Location: [facility];  Service: General;  Laterality: N/A;   MITRAL VALVE REPAIR     REMOVAL OF STONES  09/17/2019   Procedure: REMOVAL OF STONES;  Surgeon: Clarene Essex, MD;  [facility];  Service: Endoscopy;;   SPHINCTEROTOMY  09/17/2019   Procedure: [name];  Surgeon: Clarene Essex, MD;  [facility];  Service: Endoscopy;;   TOTAL SHOULDER ARTHROPLASTY Right 2013    Prior to Admission medications   Medication Sig Start Date End Date Taking? Authorizing Provider  acetaminophen (TYLENOL) 500 MG tablet Take 1,000 mg by mouth every 6 (six) hours as needed for moderate pain.   Yes [provider]  aspirin EC 81 MG tablet Take 81 mg by mouth daily as needed for mild pain.   Yes [provider]  buPROPion (WELLBUTRIN XL) 150 MG 24 hr tablet Take 150 mg by mouth daily.    Yes [provider]  busPIRone (BUSPAR) 5 MG tablet Take 5 mg by mouth 2 (two) times daily. 06/04/20  Yes [provider]  calcium carbonate (TUMS - DOSED IN MG ELEMENTAL CALCIUM) 500 MG  chewable tablet Chew 500 mg by mouth daily as needed for indigestion or heartburn.   Yes [provider]  cetirizine (ZYRTEC) 10 MG tablet Take 10 mg by mouth daily.   Yes [provider]  cycloSPORINE (RESTASIS) 0.05 % ophthalmic emulsion Place 1 drop into both eyes 2 (two) times daily.   Yes [provider]  dimenhyDRINATE (DRAMAMINE) 50 MG tablet Take 50 mg by mouth every 8 (eight) hours as needed for nausea or dizziness.   Yes [provider]   diphenhydrAMINE (BENADRYL) 25 MG tablet Take 25 mg by mouth every 6 (six) hours as needed for itching or sleep.   Yes [provider]  fluticasone (FLONASE) 50 MCG/ACT nasal spray Place 1 spray into both nostrils daily as needed for allergies or rhinitis.   Yes [provider]  furosemide (LASIX) 20 MG tablet Take 20 mg by mouth 2 (two) times daily as needed for fluid.   Yes [provider]  glycerin SUPP Place 1 Chip rectally daily as needed for moderate constipation.   Yes [provider]  HYDROmorphone (DILAUDID) 2 MG tablet Take 2 mg by mouth every 4 (four) hours as needed for severe pain.   Yes [provider]  levETIRAcetam (KEPPRA) 250 MG tablet Take 250 mg by mouth 2 (two) times daily.  12/17/19 09/23/20 Yes [provider]  Lidocaine-Glycerin (PREPARATION H EX) Apply 1 application topically daily.   Yes [provider]  magnesium gluconate (MAGONATE) 500 MG tablet Take 500 mg by mouth daily.   Yes [provider]  melatonin 5 MG TABS Take 15 mg by mouth at bedtime.   Yes [provider]  methadone (DOLOPHINE) 10 MG tablet Take 10 mg by mouth every 6 (six) hours.   Yes [provider]  midodrine (PROAMATINE) 10 MG tablet Take 10 mg by mouth 3 (three) times daily.   Yes [provider]  mirtazapine (REMERON) 15 MG tablet Take 7.5 mg by mouth at bedtime.   Yes [provider]  Multiple Vitamin (MULTIVITAMIN WITH MINERALS) TABS tablet Take 1 tablet by mouth daily.   Yes [provider]  naloxone (NARCAN) nasal spray 4 mg/0.1 mL Place 1 spray into the nose once as needed (opiod overdose).   Yes [provider]  naproxen (NAPROSYN) 500 MG tablet Take 500 mg by mouth 2 (two) times daily.   Yes [provider]  NON FORMULARY 1 Dose by Implant route continuous. Intrathecal pump - Gabapentin - 22.107 mg /day + clonidine 110.54 mcg /day   Yes [provider]   omeprazole (PRILOSEC) 20 MG capsule Take 20 mg by mouth daily.   Yes [provider]  ondansetron (ZOFRAN) 8 MG tablet Take 8 mg by mouth every 8 (eight) hours as needed for nausea or vomiting.   Yes [provider]  PARoxetine (PAXIL) 10 MG tablet Take 10 mg by mouth daily.    Yes [provider]  Polyethyl Glycol-Propyl Glycol (SYSTANE HYDRATION PF OP) Place 1 drop into both eyes 2 (two) times daily.   Yes [provider]  polyethylene glycol (MIRALAX / GLYCOLAX) 17 g packet Take 17 g by mouth daily.   Yes [provider]  prednisoLONE acetate (PRED FORTE) 1 % ophthalmic suspension Place 1 drop into the left eye 4 (four) times daily.   Yes [provider]  Suvorexant (BELSOMRA) 10 MG TABS Take 10 mg by mouth daily.   Yes [provider]  tiZANidine (ZANAFLEX) 4  MG tablet Take 4 mg by mouth 3 (three) times daily as needed for muscle spasms.    Yes [provider]  triamcinolone (KENALOG) 0.025 % cream Apply 1 application topically 2 (two) times daily.   Yes [provider]    Scheduled Meds:  sodium chloride   Intravenous Once   buPROPion  150 mg Oral Daily   busPIRone  5 mg Oral BID   Chlorhexidine Gluconate Cloth  6 each Topical Daily   cycloSPORINE  1 drop Both Eyes BID   levETIRAcetam  250 mg Oral BID   loratadine  10 mg Oral Daily   magnesium gluconate  500 mg Oral Daily   melatonin  15 mg Oral QHS   methadone  10 mg Oral Q6H   midodrine  10 mg Oral TID   mirtazapine  7.5 mg Oral QHS   pantoprazole (PROTONIX) IV  40 mg Intravenous Q12H   PARoxetine  10 mg Oral Daily   polyvinyl alcohol  1 drop Both Eyes BID   prednisoLONE acetate  1 drop Left Eye QID   Suvorexant  10 mg Oral Daily   Continuous Infusions:  sodium chloride     PRN Meds:.sodium chloride, diphenhydrAMINE, HYDROmorphone, tiZANidine  Allergies as of 09/23/2020 - Review Complete 09/23/2020  Allergen Reaction Noted   Pregabalin   03/13/2020   Demerol  [meperidine hcl] Nausea And Vomiting 02/08/2012   Ferrous sulfate Nausea And Vomiting 03/13/2020   Gabapentin Other (See Comments) 02/08/2012   Hydroxyzine Other (See Comments) 11/09/2015   Macrolides and ketolides Swelling 03/13/2020   Midazolam Other (See Comments) 02/08/2012   Trazodone and nefazodone Other (See Comments) 11/09/2015   Ambien [zolpidem] Other (See Comments) 06/15/2020   Band-aid infection defense [bacitracin-polymyxin b] Swelling 09/23/2020   Latex  06/15/2020   Zolpidem tartrate  09/15/2020   Escitalopram oxalate Other (See Comments) 08/11/2015   Penicillins Rash 03/23/2018   Vancomycin Swelling and Rash 03/23/2018    Family History  Problem Relation Age of Onset   Heart disease Mother    Alzheimer's disease Mother    Kidney disease Father    Melanoma Sister    Heart disease Brother     Social History   Socioeconomic History   Marital status: Single    Spouse name: Not on file   Number of children: Not on file   Years of education: Not on file   Highest education level: Not on file  Occupational History   Not on file  Tobacco Use   Smoking status: Former    Years: 32.00    Pack years: 0.00    Types: Cigarettes    Quit date: 2000    Years since quitting: 22.5   Smokeless tobacco: Never  Vaping Use   Vaping Use: Never used  Substance and Sexual Activity   Alcohol use: Never   Drug use: Never   Sexual activity: Not Currently  Other Topics Concern   Not on file  Social History Narrative   Not on file   Social Determinants of Health   Financial Resource Strain: Not on file  Food Insecurity: Not on file  Transportation Needs: Not on file  Physical Activity: Not on file  Stress: Not on file  Social Connections: Not on file  Intimate Partner Violence: Not on file    Review of Systems: All negative except as stated above in HPI.  Physical Exam: Vital signs: Vitals:   09/24/20 0824 09/24/20 0848  BP:  119/70   Pulse:  Resp:    Temp: (!) 95.2 F (35.1 C) (!) 95.2 F (35.1 C)  SpO2:    P 58 Last BM Date: 09/23/20 General:   Lethargic, elderly, thin, no acute distress  Head: normocephalic, atraumatic Eyes: anicteric sclera ENT: oropharynx clear Neck: supple, nontender Lungs:  Clear throughout to auscultation.   No wheezes, crackles, or rhonchi. No acute distress. Heart:  Regular rate and rhythm; no murmurs, clicks, rubs,  or gallops. Abdomen: soft, nontender, nondistended, +BS  Rectal:  Deferred Ext: no edema  GI:  Lab Results: Recent Labs    09/23/20 1328 09/24/20 0242  WBC 9.1 4.0  HGB 7.6* 6.6*  HCT 28.3* 23.5*  PLT 383 330   BMET Recent Labs    09/23/20 1328  NA 140  K 4.6  CL 101  CO2 33*  GLUCOSE 90  BUN 46*  CREATININE 1.64*  CALCIUM 8.9   LFT Recent Labs    09/23/20 1328  PROT 6.1*  ALBUMIN 3.2*  AST 21  ALT 16  ALKPHOS 139*  BILITOT 0.7   PT/INR No results for input(s): LABPROT, INR in the last 72 hours.   Studies/Results: No results found.  Impression/Plan: Symptomatic anemia and chronic BRBPR. Has been on Naprosyn daily chronically and needs an updated EGD to evaluate for peptic ulcer disease. NPO. Supportive care. EGD today by Dr. Paulita Fujita. If EGD unrevealing, then may need an updated colonoscopy during this admit with timing to be determined.    LOS: 0 days   Lear Ng  09/24/2020, 9:00 AM  Questions please call [phone]

## 2020-09-24 NOTE — Progress Notes (Signed)
[facility] PROGRESS NOTE    Progress Note  Kelly Curtis  WER:[id number] DOB: [birth year] DOA: 09/23/2020 PCP: [name], MD     Brief Narrative:   Kelly Curtis is an 75 y.o. 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     Assessment/Plan:   Acute blood loss anemia: Dyspnea is likely due to anemia, FOBT was positive. She has been taking naproxen, she relates bright red stool for the last couple of weeks, but her BUN to creatinine ratio is not greater than 40-1, she denies any new abdominal pain. She is status post 1 unit of packed red blood cells and she still remains asymptomatic feels fatigue and tired, her hemoglobin this morning is 6.6 will transfuse an additional unit and check a CBC posttransfusion all. GI has been notified, keep her n.p.o. for possible procedure.  Dysphagia: To solids, might need a barium swallow, will deferred to GI.  Hypothermia: Continue Bair hugger possibly due to anemia no signs of sepsis or infection.  Acute kidney injury on chronic kidney disease stage II: Likely prerenal transfuse 1 unit of packed red blood cells. Recheck a basic metabolic panel.  Chronic diastolic heart failure: Appears euvolemic.  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  Chronic pain syndrome: Continue methadone and Dilaudid. Holding naproxen due to acute kidney injury.  Insomnia: Continue current medication.    DVT prophylaxis: scd Family Communication:none Status is: Observation  The patient will require care spanning > 2 midnights and should be moved to inpatient  because: Hemodynamically unstable  Dispo: The patient is from: Home              Anticipated d/c is to: Home              Patient currently is not medically stable to d/c.   Difficult to place patient No   Code Status:     Code Status Orders  (From admission, onward)           Start     Ordered   09/23/20 1925  Do not attempt resuscitation (DNR)  Continuous       Question Answer Comment  In the event of cardiac or respiratory ARREST Do not call a "code blue"   In the event of cardiac or respiratory ARREST Do not perform Intubation, CPR, defibrillation or ACLS   In the event of cardiac or respiratory ARREST Use medication by any route, position, wound care, and other measures to relive pain and suffering. May use oxygen, suction and manual treatment of airway obstruction as needed for comfort.      09/23/20 1924           Code Status History     Date Active Date Inactive Code Status Order ID Comments User Context   09/23/2020 1859 09/23/2020 1924 Full Code [id number]  Orene Desanctis, DO ED   06/15/2020 2326 06/25/2020 1937 DNR [id number]  Doran Heater, DO ED   02/09/2020 1643 02/12/2020 0341 DNR [id number]  Harold Hedge, MD ED   03/23/2018 2128 03/24/2018 1719 Full Code [id number]  Ivor Costa, MD ED      Advance Directive Documentation    Flowsheet  Row Most Recent Value  Type of Advance Directive Healthcare Power of Attorney, Living will, Out of facility DNR (pink MOST or yellow form)  [did not bring any copies]  Pre-existing out of facility DNR order (yellow form or pink MOST form) --  "MOST" Form in Place? --         IV Access:   Peripheral IV   Procedures and diagnostic studies:   No results found.   Medical Consultants:   None.   Subjective:    Kelly Curtis she denies any new abdominal pain feels tired fatigue and very weak  Objective:    Vitals:   09/24/20 0444 09/24/20 0500 09/24/20 0538 09/24/20 0616  BP:  (!) 92/47 (!) 90/52 (!) 108/56   Pulse:  67 61 65  Resp:  14 12 13   Temp: 97.8 F (36.6 C)   97.8 F (36.6 C)  TempSrc: Oral   Oral  SpO2:  90% 90% 96%  Weight:      Height:       SpO2: 96 %   Intake/Output Summary (Last 24 hours) at 09/24/2020 0651 Last data filed at 09/24/2020 0616 Gross per 24 hour  Intake 565 ml  Output 300 ml  Net 265 ml   Filed Weights   09/24/20 0145  Weight: 72 kg    Exam: General exam: In no acute distress. Respiratory system: Good air movement and clear to auscultation. Cardiovascular system: S1 & S2 heard, RRR. No JVD. Gastrointestinal system: Abdomen is nondistended, soft and nontender.  Extremities: No pedal edema. Skin: No rashes, lesions or ulcers Psychiatry: Judgement and insight appear normal. Mood & affect appropriate.    Data Reviewed:    Labs: Basic Metabolic Panel: Recent Labs  Lab 09/23/20 1328  NA 140  K 4.6  CL 101  CO2 33*  GLUCOSE 90  BUN 46*  CREATININE 1.64*  CALCIUM 8.9   GFR Estimated Creatinine Clearance: 30.2 mL/min (A) (by C-G formula based on SCr of 1.64 mg/dL (H)). Liver Function Tests: Recent Labs  Lab 09/23/20 1328  AST 21  ALT 16  ALKPHOS 139*  BILITOT 0.7  PROT 6.1*  ALBUMIN 3.2*   No results for input(s): LIPASE, AMYLASE in the last 168 hours. No results for input(s): AMMONIA in the last 168 hours. Coagulation profile No results for input(s): INR, PROTIME in the last 168 hours. COVID-19 Labs  No results for input(s): DDIMER, FERRITIN, LDH, CRP in the last 72 hours.  Lab Results  Component Value Date   SARSCOV2NAA NEGATIVE 09/23/2020   SARSCOV2NAA NEGATIVE 06/15/2020   SARSCOV2NAA NEGATIVE 02/09/2020   [place] NEGATIVE 12/09/2019    CBC: Recent Labs  Lab 09/23/20 1328 09/24/20 0242  WBC 9.1 4.0  NEUTROABS 7.0  --   HGB 7.6* 6.6*  HCT 28.3* 23.5*  MCV 91.3 91.8  PLT 383 330   Cardiac Enzymes: No results for input(s): CKTOTAL, CKMB, CKMBINDEX, TROPONINI in the last 168 hours. BNP (last 3  results) No results for input(s): PROBNP in the last 8760 hours. CBG: No results for input(s): GLUCAP in the last 168 hours. D-Dimer: No results for input(s): DDIMER in the last 72 hours. Hgb A1c: No results for input(s): HGBA1C in the last 72 hours. Lipid Profile: No results for input(s): CHOL, HDL, LDLCALC, TRIG, CHOLHDL, LDLDIRECT in the last 72 hours. Thyroid function studies: No results for input(s): TSH, T4TOTAL, T3FREE, THYROIDAB in the last 72 hours.  Invalid input(s): FREET3 Anemia work up: [employer]    09/24/20 0242  VITAMINB12 332  FOLATE 7.5  TIBC 433  IRON 24*   Sepsis Labs: Recent Labs  Lab 09/23/20 1328 09/24/20 0242  WBC 9.1 4.0   Microbiology Recent Results (from the past 240 hour(s))  Resp Panel by RT-PCR (Flu A&B, Covid) Nasopharyngeal Swab     Status: None   Collection Time: 09/23/20  7:25 PM   Specimen: Nasopharyngeal Swab; Nasopharyngeal(NP) swabs in vial transport medium  Result Value Ref Range Status   SARS Coronavirus 2 by RT PCR NEGATIVE NEGATIVE Final    Comment: (NOTE) SARS-CoV-2 target nucleic acids are NOT DETECTED.  The SARS-CoV-2 RNA is generally detectable in upper respiratory specimens during the acute phase of infection. The lowest concentration of SARS-CoV-2 viral copies this assay can detect is 138 copies/mL. A negative result does not preclude SARS-Cov-2 infection and should not be used as the sole basis for treatment or other patient management decisions. A negative result may occur with  improper specimen collection/handling, submission of specimen other than nasopharyngeal swab, presence of viral mutation(s) within the areas targeted by this assay, and inadequate number of viral copies(<138 copies/mL). A negative result must be combined with clinical observations, patient history, and epidemiological information. The expected result is Negative.  Fact Sheet for Patients:  EntrepreneurPulse.com.au  Fact  Sheet for Healthcare Providers:  IncredibleEmployment.be  This test is no t yet approved or cleared by the [place] FDA and  has been authorized for detection and/or diagnosis of SARS-CoV-2 by FDA under an Emergency Use Authorization (EUA). This EUA will remain  in effect (meaning this test can be used) for the duration of the COVID-19 declaration under Section 564(b)(1) of the Act, 21 U.S.C.section 360bbb-3(b)(1), unless the authorization is terminated  or revoked sooner.       Influenza A by PCR NEGATIVE NEGATIVE Final   Influenza B by PCR NEGATIVE NEGATIVE Final    Comment: (NOTE) The Xpert Xpress SARS-CoV-2/FLU/RSV plus assay is intended as an aid in the diagnosis of influenza from Nasopharyngeal swab specimens and should not be used as a sole basis for treatment. Nasal washings and aspirates are unacceptable for Xpert Xpress SARS-CoV-2/FLU/RSV testing.  Fact Sheet for Patients: EntrepreneurPulse.com.au  Fact Sheet for Healthcare Providers: IncredibleEmployment.be  This test is not yet approved or cleared by the [place] FDA and has been authorized for detection and/or diagnosis of SARS-CoV-2 by FDA under an Emergency Use Authorization (EUA). This EUA will remain in effect (meaning this test can be used) for the duration of the COVID-19 declaration under Section 564(b)(1) of the Act, 21 U.S.C. section 360bbb-3(b)(1), unless the authorization is terminated or revoked.  Performed at [facility], [place] [address] [place], [place] [address]      Medications:    sodium chloride   Intravenous Once   buPROPion  150 mg Oral Daily   busPIRone  5 mg Oral BID   Chlorhexidine Gluconate Cloth  6 each Topical Daily   cycloSPORINE  1 drop Both Eyes BID   levETIRAcetam  250 mg Oral BID   loratadine  10 mg Oral Daily   magnesium gluconate  500 mg Oral Daily   melatonin  15 mg Oral QHS   methadone   10 mg Oral Q6H   midodrine  10 mg Oral TID   mirtazapine  7.5 mg Oral QHS   pantoprazole  40 mg Oral Daily   PARoxetine  10 mg Oral Daily   polyvinyl alcohol  1 drop Both Eyes BID   prednisoLONE acetate  1 drop Left Eye QID   Suvorexant  10 mg Oral Daily   Continuous Infusions:  sodium chloride        LOS: 0 days   Charlynne Cousins  [facility]  09/24/2020, 6:51 AM

## 2020-09-24 NOTE — Progress Notes (Signed)
Calvin attempted patient visit as referred by [facility].  Patient not in room as she is undergoing procedure.  Chaplain will refer back to other chaplain if she is unable to see patient today. Rev. Tamsen Snider Pager [phone]

## 2020-09-24 NOTE — Progress Notes (Signed)
Rectal temp 95.2. bair hugger reapplied for goal of 96.5 per MD order. Pt educated. RN will continue to monitor.

## 2020-09-24 NOTE — Progress Notes (Addendum)
Received a page that patient would like support.  Attempted to visit but patient was sleeping.  We will try back later in the afternoon.

## 2020-09-24 NOTE — Transfer of Care (Signed)
Immediate Anesthesia Transfer of Care Note  Patient: Kelly Curtis  Procedure(s) Performed: ESOPHAGOGASTRODUODENOSCOPY (EGD) WITH PROPOFOL  Patient Location: PACU  Anesthesia Type:MAC  Level of Consciousness: drowsy  Airway & Oxygen Therapy: Patient Spontanous Breathing and Patient connected to face mask oxygen  Post-op Assessment: Report given to RN and Post -op Vital signs reviewed and stable  Post vital signs: Reviewed and stable  Last Vitals:  Vitals Value Taken Time  BP 84/42 09/24/20 1402  Temp    Pulse 58 09/24/20 1404  Resp 10 09/24/20 1404  SpO2 98 % 09/24/20 1404  Vitals shown include unvalidated device data.  Last Pain:  Vitals:   09/24/20 1402  TempSrc:   PainSc: Asleep      Patients Stated Pain Goal: 5 (00/86/76 1950)  Complications: No notable events documented.

## 2020-09-25 ENCOUNTER — Encounter (HOSPITAL_COMMUNITY)
Admission: EM | Disposition: A | Discharge status: Home / Self Care | Payer: Self-pay | Source: Home / Self Care | Attending: Internal Medicine

## 2020-09-25 ENCOUNTER — Encounter (HOSPITAL_COMMUNITY): Payer: Self-pay | Admitting: Internal Medicine

## 2020-09-25 ENCOUNTER — Inpatient Hospital Stay (HOSPITAL_COMMUNITY): Payer: Medicare Other | Admitting: Anesthesiology

## 2020-09-25 ENCOUNTER — Encounter: Payer: Medicare Other | Admitting: Physical Therapy

## 2020-09-25 HISTORY — PX: COLONOSCOPY WITH PROPOFOL: SHX5780

## 2020-09-25 HISTORY — PX: GIVENS CAPSULE STUDY: SHX5432

## 2020-09-25 LAB — BPAM RBC
Blood Product Expiration Date: 202208012359
Blood Product Expiration Date: 202208032359
ISSUE DATE / TIME: 202206300256
ISSUE DATE / TIME: 202206300815
Unit Type and Rh: 5100
Unit Type and Rh: 5100

## 2020-09-25 LAB — BASIC METABOLIC PANEL
Anion gap: 4 — ABNORMAL LOW (ref 5–15)
BUN: 24 mg/dL — ABNORMAL HIGH (ref 8–23)
CO2: 33 mmol/L — ABNORMAL HIGH (ref 22–32)
Calcium: 8.4 mg/dL — ABNORMAL LOW (ref 8.9–10.3)
Chloride: 104 mmol/L (ref 98–111)
Creatinine, Ser: 1.26 mg/dL — ABNORMAL HIGH (ref 0.44–1.00)
GFR, Estimated: 45 mL/min — ABNORMAL LOW (ref >60)
Glucose, Bld: 80 mg/dL (ref 70–99)
Potassium: 4.8 mmol/L (ref 3.5–5.1)
Sodium: 141 mmol/L (ref 135–145)

## 2020-09-25 LAB — TYPE AND SCREEN
ABO/RH(D): O POS
Antibody Screen: NEGATIVE
Unit division: 0
Unit division: 0

## 2020-09-25 SURGERY — IMAGING PROCEDURE, GI TRACT, INTRALUMINAL, VIA CAPSULE

## 2020-09-25 SURGERY — COLONOSCOPY WITH PROPOFOL
Anesthesia: Monitor Anesthesia Care

## 2020-09-25 MED ORDER — SODIUM CHLORIDE 0.9 % IV SOLN: INTRAVENOUS | Status: DC

## 2020-09-25 MED ORDER — PROPOFOL 500 MG/50ML IV EMUL
INTRAVENOUS | Status: AC
  Filled 2020-09-25: qty 50

## 2020-09-25 MED ORDER — PROPOFOL 10 MG/ML IV BOLUS
INTRAVENOUS | Status: AC
  Filled 2020-09-25: qty 40

## 2020-09-25 MED ORDER — PROPOFOL 500 MG/50ML IV EMUL
INTRAVENOUS | Status: DC | PRN
  Administered 2020-09-25: 125 ug/kg/min via INTRAVENOUS

## 2020-09-25 MED ORDER — EPHEDRINE SULFATE 50 MG/ML IJ SOLN
INTRAMUSCULAR | Status: DC | PRN
  Administered 2020-09-25: 10 mg via INTRAVENOUS
  Administered 2020-09-25: 5 mg via INTRAVENOUS

## 2020-09-25 MED ORDER — SODIUM CHLORIDE 0.9 % IV SOLN
510.0000 mg | Freq: Once | INTRAVENOUS | Status: AC
  Administered 2020-09-25: 510 mg via INTRAVENOUS
  Filled 2020-09-25: qty 17
  Filled 2020-09-25: qty 510

## 2020-09-25 MED ORDER — GLYCOPYRROLATE 0.2 MG/ML IJ SOLN
INTRAMUSCULAR | Status: DC | PRN
  Administered 2020-09-25: .2 mg via INTRAVENOUS

## 2020-09-25 MED ORDER — LIDOCAINE HCL (CARDIAC) PF 100 MG/5ML IV SOSY
PREFILLED_SYRINGE | INTRAVENOUS | Status: DC | PRN
  Administered 2020-09-25: 50 mg via INTRAVENOUS

## 2020-09-25 MED ORDER — PROPOFOL 1000 MG/100ML IV EMUL
INTRAVENOUS | Status: AC
  Filled 2020-09-25: qty 100

## 2020-09-25 SURGICAL SUPPLY — 22 items

## 2020-09-25 NOTE — Anesthesia Preprocedure Evaluation (Signed)
Anesthesia Evaluation  Patient identified by MRN, date of birth, ID band Patient awake    Reviewed: Allergy & Precautions, H&P , NPO status , Patient's Chart, lab work & pertinent test results, reviewed documented beta blocker date and time   Airway Mallampati: II  TM Distance: >3 FB Neck ROM: full    Dental no notable dental hx. (+) Teeth Intact, Dental Advisory Given, Caps   Pulmonary neg pulmonary ROS, former smoker,    Pulmonary exam normal breath sounds clear to auscultation       Cardiovascular Exercise Tolerance: Good hypertension, + Peripheral Vascular Disease, +CHF and + DVT  Normal cardiovascular exam+ dysrhythmias  Rhythm:regular Rate:Normal + Systolic murmurs S/P mitral annuloplasty   Neuro/Psych  Headaches, PSYCHIATRIC DISORDERS Anxiety Depression    GI/Hepatic Neg liver ROS, GERD  Medicated,  Endo/Other  negative endocrine ROS  Renal/GU negative Renal ROS  negative genitourinary   Musculoskeletal  (+) Arthritis , Osteoarthritis,    Abdominal   Peds negative pediatric ROS (+)  Hematology  (+) Blood dyscrasia, anemia , Blood loss anemia   Anesthesia Other Findings   Reproductive/Obstetrics negative OB ROS                             Anesthesia Physical  Anesthesia Plan  ASA: 3  Anesthesia Plan: MAC   Post-op Pain Management:    Induction: Intravenous  PONV Risk Score and Plan: 2 and Propofol infusion and Treatment may vary due to age or medical condition  Airway Management Planned: Simple Face Mask  Additional Equipment: None  Intra-op Plan:   Post-operative Plan:   Informed Consent: I have reviewed the patients History and Physical, chart, labs and discussed the procedure including the risks, benefits and alternatives for the proposed anesthesia with the patient or authorized representative who has indicated his/her understanding and acceptance.     Dental  Advisory Given  Plan Discussed with: CRNA and Anesthesiologist  Anesthesia Plan Comments:         Anesthesia Quick Evaluation

## 2020-09-25 NOTE — Progress Notes (Signed)
Patient experiencing more forgetfulness this evening. Patient pulled off leads to capsule study, unable to reapply them. Patient keeps pulling off her telemetry monitor. Educated patient to keep her capsule monitor on and her cardiac monitor on. Patient forgetting to use the call bell prior to getting out of bed without assistance. At times today, patient became unsteady on her feet using her front wheel walker. Bed alarms are on. MD and GI MD made aware.

## 2020-09-25 NOTE — Progress Notes (Signed)
Patient swallowed pillcam with no difficulties at 1155am. NPO for 2 hours. Clear liquids at 155pm. Light snack at 355pm. Resume previously ordered diet at 755pm. Remove device and place in bag at 1155pm.

## 2020-09-25 NOTE — Progress Notes (Signed)
Report called to the receiving nurse Christa on [facility]. All personal belongings packed and sent with patient to room [facility]. Patient Transferred and received on the 5th floor.

## 2020-09-25 NOTE — Progress Notes (Signed)
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  Patient was also having dyspnea.  On presentation FOBT was positive.  Patient also reported bright red blood per rectum in the stool and with wiping for last couple of weeks.  Also reported rectal pain, burning and itching.  On presentation hemoglobin was in the range of 6.  She takes naproxen chronically.  GI consulted and following.  Status post EGD/possible without finding of any clear source of bleeding.  Plan for video capsule i study  Assessment & Plan:   Principal Problem:   Acute blood loss anemia Active Problems:   Erythromelalgia ([place])   Depression with anxiety   Chronic pain   Chronic diastolic CHF (congestive heart failure) (HCC)   Hypothermia   Acute kidney injury superimposed on chronic kidney disease (HCC)   Insomnia   Acute GI bleeding   Acute blood loss anemia: Hemoglobin was in the range of 7 on presentation , dropped to the range of 6.  FOBT was positive on presentation .she was transfused with  2 units of PRBC. Currently hemoglobin stable in the range of 6.  She underwent EGD without finding of any source of bleeding. EGD showed normal esophagus, gastritis.  Colonoscopy showed hemorrhoids, diverticulosis, internal hemorrhoids but no source of bleeding, no active bleeding.   Iron was low as per iron studies.  We will give her a dose of IV iron.  We will supplement iron on discharge.  Plan for video capsule endoscopy.  Continue to monitor H&H.  Hemoglobin stable in the range of 9 today.  History of dysphagia: GI following.  No  finding of stenosis or stricture as per EGD.  AKI on CKD stage II: Creatinine was in the range of 1.6 on presentation. Likely secondary to dehydration. Currently at baseline.  Chronic diastolic congestive heart failure: Currently euvolemic  Chronic pain syndrome/erythromelalgia: Has erythematous discoloration of bilateral hands and feet associated with pain.  On intrathecal  pump.  Also on methadone and Dilaudid.  She was also taking naproxen at home.  Continue supportive care.  Follows with dermatology.           DVT prophylaxis:SCD Code Status: DNR Family Communication: None at the bedside Status is: Inpatient  Remains inpatient appropriate because:Unsafe d/c plan  Dispo: The patient is from: Home              Anticipated d/c is to: Home              Patient currently is not medically stable to d/c.   Difficult to place patient No     Consultants: GI  Procedures:EGD/colonoscopy  Antimicrobials:  Anti-infectives (From admission, onward)    None       Subjective: Patient seen and examined the bedside this morning.  She just came after colonoscopy procedure.  She denies abdomen pain, nausea or vomiting.  No active hematochezia or melena at present.  Apparently looks comfortable  Objective: Vitals:   09/25/20 0240 09/25/20 0417 09/25/20 0510 09/25/20 0600  BP:  106/69 122/69 121/78  Pulse:  63 64 (!) 59  Resp:  14 19 (!) 27  Temp: Marland Kitchen)  94 F (34.4 C) (!) 97.4 F (36.3 C)    TempSrc: Axillary Oral    SpO2:  95% 100% 96%  Weight:      Height:        Intake/Output Summary (Last 24 hours) at 09/25/2020 3382 Last data filed at 09/25/2020 0745 Gross per 24 hour  Intake 1199.67 ml  Output 950 ml  Net 249.67 ml   Filed Weights   09/24/20 0145  Weight: 72 kg    Examination:  General exam: Appears calm and comfortable ,Not in distress, weak, pleasant  female HEENT:PERRL,Oral mucosa moist, Ear/Nose normal on gross exam Respiratory system: Bilateral equal air  entry, normal vesicular breath sounds, no wheezes or crackles  Cardiovascular system: S1 & S2 heard, RRR. No JVD, murmurs, rubs, gallops or clicks. No pedal edema. Gastrointestinal system: Abdomen is nondistended, soft and nontender. No organomegaly or masses felt. Normal bowel sounds heard. Central nervous system: Alert and oriented. No focal neurological deficits. Extremities: No edema, no clubbing ,no cyanosis Skin: Erythematous discoloration on bilateral hands and feet, no ulcers   Data Reviewed: I have personally reviewed following labs and imaging studies  CBC: Recent Labs  Lab 09/23/20 1328 09/24/20 0242 09/24/20 1219  WBC 9.1 4.0 5.4  NEUTROABS 7.0  --   --   HGB 7.6* 6.6* 9.4*  HCT 28.3* 23.5* 31.0*  MCV 91.3 91.8 88.6  PLT 383 330 505   Basic Metabolic Panel: Recent Labs  Lab 09/23/20 1328 09/25/20 0230  NA 140 141  K 4.6 4.8  CL 101 104  CO2 33* 33*  GLUCOSE 90 80  BUN 46* 24*  CREATININE 1.64* 1.26*  CALCIUM 8.9 8.4*   GFR: Estimated Creatinine Clearance: 39.2 mL/min (A) (by C-G formula based on SCr of 1.26 mg/dL (H)). Liver Function Tests: Recent Labs  Lab 09/23/20 1328  AST 21  ALT 16  ALKPHOS 139*  BILITOT 0.7  PROT 6.1*  ALBUMIN 3.2*   No results for input(s): LIPASE, AMYLASE in the last 168 hours. No results for input(s): AMMONIA in the last 168 hours. Coagulation Profile: No results for input(s): INR, PROTIME in the last 168 hours. Cardiac Enzymes: No results for input(s): CKTOTAL, CKMB, CKMBINDEX, TROPONINI in the last 168 hours. BNP (last 3 results) No results for input(s): PROBNP in the last 8760 hours. HbA1C: No results for input(s): HGBA1C in the last 72 hours. CBG: No results for input(s): GLUCAP in the last 168 hours. Lipid Profile: No results for input(s): CHOL, HDL, LDLCALC, TRIG, CHOLHDL, LDLDIRECT in the last 72 hours. Thyroid Function Tests: No results for input(s): TSH, T4TOTAL, FREET4, T3FREE, THYROIDAB in the last 72  hours. Anemia Panel: Recent Labs    09/24/20 0242  VITAMINB12 332  FOLATE 7.5  TIBC 433  IRON 24*   Sepsis Labs: No results for input(s): PROCALCITON, LATICACIDVEN in the last 168 hours.  Recent Results (from the past 240 hour(s))  Resp Panel by RT-PCR (Flu A&B, Covid) Nasopharyngeal Swab     Status: None   Collection Time: 09/23/20  7:25 PM   Specimen: Nasopharyngeal Swab; Nasopharyngeal(NP) swabs in vial transport medium  Result Value Ref Range Status   SARS Coronavirus 2 by RT PCR NEGATIVE NEGATIVE Final    Comment: (NOTE) SARS-CoV-2 target nucleic acids are NOT DETECTED.  The SARS-CoV-2 RNA is generally detectable in upper respiratory specimens during the acute phase of infection. The lowest concentration of SARS-CoV-2 viral copies this assay can detect is 138 copies/mL. A negative result does not preclude  SARS-Cov-2 infection and should not be used as the sole basis for treatment or other patient management decisions. A negative result may occur with  improper specimen collection/handling, submission of specimen other than nasopharyngeal swab, presence of viral mutation(s) within the areas targeted by this assay, and inadequate number of viral copies(<138 copies/mL). A negative result must be combined with clinical observations, patient history, and epidemiological information. The expected result is Negative.  Fact Sheet for Patients:  EntrepreneurPulse.com.au  Fact Sheet for Healthcare Providers:  IncredibleEmployment.be  This test is no t yet approved or cleared by the [place] FDA and  has been authorized for detection and/or diagnosis of SARS-CoV-2 by FDA under an Emergency Use Authorization (EUA). This EUA will remain  in effect (meaning this test can be used) for the duration of the COVID-19 declaration under Section 564(b)(1) of the Act, 21 U.S.C.section 360bbb-3(b)(1), unless the authorization is terminated  or  revoked sooner.       Influenza A by PCR NEGATIVE NEGATIVE Final   Influenza B by PCR NEGATIVE NEGATIVE Final    Comment: (NOTE) The Xpert Xpress SARS-CoV-2/FLU/RSV plus assay is intended as an aid in the diagnosis of influenza from Nasopharyngeal swab specimens and should not be used as a sole basis for treatment. Nasal washings and aspirates are unacceptable for Xpert Xpress SARS-CoV-2/FLU/RSV testing.  Fact Sheet for Patients: EntrepreneurPulse.com.au  Fact Sheet for Healthcare Providers: IncredibleEmployment.be  This test is not yet approved or cleared by the [place] FDA and has been authorized for detection and/or diagnosis of SARS-CoV-2 by FDA under an Emergency Use Authorization (EUA). This EUA will remain in effect (meaning this test can be used) for the duration of the COVID-19 declaration under Section 564(b)(1) of the Act, 21 U.S.C. section 360bbb-3(b)(1), unless the authorization is terminated or revoked.  Performed at [facility], [place] [address] [place], [place] [address]          Radiology Studies: No results found.      Scheduled Meds:  buPROPion  150 mg Oral Daily   busPIRone  5 mg Oral BID   Chlorhexidine Gluconate Cloth  6 each Topical Daily   cycloSPORINE  1 drop Both Eyes BID   levETIRAcetam  250 mg Oral BID   loratadine  10 mg Oral Daily   magnesium gluconate  500 mg Oral Daily   melatonin  15 mg Oral QHS   methadone  10 mg Oral Q6H   midodrine  5 mg Oral TID with meals   mirtazapine  7.5 mg Oral QHS   pantoprazole (PROTONIX) IV  40 mg Intravenous Q12H   PARoxetine  10 mg Oral Daily   polyvinyl alcohol  1 drop Both Eyes BID   prednisoLONE acetate  1 drop Left Eye QID   Suvorexant  10 mg Oral Daily   Continuous Infusions:  sodium chloride     sodium chloride 20 mL/hr at 09/24/20 1828     LOS: 1 day    Time spent: 35 mins.More than 50% of that time was spent in  counseling and/or coordination of care.      Shelly Coss, MD [facility] P7/03/2020, 8:07 AM

## 2020-09-25 NOTE — Interval H&P Note (Signed)
History and Physical Interval Note:  09/25/2020 9:05 AM  Kelly Curtis  has presented today for surgery, with the diagnosis of hematochezia, anemia.  The various methods of treatment have been discussed with the patient and family. After consideration of risks, benefits and other options for treatment, the patient has consented to  Procedure(s): COLONOSCOPY WITH PROPOFOL (N/A) as a surgical intervention.  The patient's history has been reviewed, patient examined, no change in status, stable for surgery.  I have reviewed the patient's chart and labs.  Questions were answered to the patient's satisfaction.     [name]

## 2020-09-25 NOTE — Op Note (Signed)
[facility] Patient Name: Kelly Curtis Procedure Date: 09/25/2020 MRN: [id number] Attending MD: Arta Silence , MD Date of Birth: 1945/09/15 CSN: [id number] Age: 75 Admit Type: Inpatient Procedure:                Colonoscopy Indications:              Hematochezia, Acute post hemorrhagic anemia Providers:                Arta Silence, MD, Nelia Shi, RN, Laverda Sorenson, Technician, Arnoldo Hooker, CRNA Referring MD:             Hospitalists Medicines:                Monitored Anesthesia Care Complications:            No immediate complications. Estimated Blood Loss:     Estimated blood loss: none. Procedure:                Pre-Anesthesia Assessment:                           - Prior to the procedure, a History and Physical                            was performed, and patient medications and                            allergies were reviewed. The patient's tolerance of                            previous anesthesia was also reviewed. The risks                            and benefits of the procedure and the sedation                            options and risks were discussed with the patient.                            All questions were answered, and informed consent                            was obtained. Prior Anticoagulants: The patient has                            taken no previous anticoagulant or antiplatelet                            agents. ASA Grade Assessment: III - A patient with                            severe systemic disease. After reviewing the risks  and benefits, the patient was deemed in                            satisfactory condition to undergo the procedure.                           After obtaining informed consent, the colonoscope                            was passed under direct vision. Throughout the                            procedure, the patient's blood pressure, pulse, and                             oxygen saturations were monitored continuously. The                            [id number]-[id number] ([id number] Olympus pediatric colonscope                            was introduced through the anus and advanced to the                            the cecum, identified by appendiceal orifice and                            ileocecal valve. The ileocecal valve, appendiceal                            orifice, and rectum were photographed. The entire                            colon was examined. The colonoscopy was performed                            without difficulty. The patient tolerated the                            procedure well. The quality of the bowel                            preparation was good. Scope In: 9:19:53 AM Scope Out: 9:43:57 AM Scope Withdrawal Time: 0 hours 10 minutes 8 seconds  Total Procedure Duration: 0 hours 24 minutes 4 seconds  Findings:      Hemorrhoids were found on perianal exam.      A few medium-mouthed diverticula were found in the sigmoid colon and       descending colon.      Colon otherwise normal; no other polyps, masses, vascular ectasias, or       inflammatory changes were seen.      Internal hemorrhoids were found during retroflexion. The hemorrhoids       were mild.      No additional abnormalities were found on retroflexion.  No old/fresh blood seen in colon at time of our examination. Impression:               - Hemorrhoids found on perianal exam.                           - Diverticulosis in the sigmoid colon and in the                            descending colon.                           - Internal hemorrhoids.                           - The examination was otherwise normal.                           - GI bleeding could be from hemorrhoids or                            diverticulosis. No active bleeding now. No                            explanation for anemia on endoscopy/colonoscopy                            (could be  small bowel source, could be anemia of                            chronic disease) Moderate Sedation:      None Recommendation:           - Return patient to hospital ward for ongoing care.                           - Clear liquid diet today.                           - Continue present medications.                           - To visualize the small bowel, perform video                            capsule endoscopy today.                           [facility] will follow. Procedure Code(s):        --- Professional ---                           [phone], Colonoscopy, flexible; diagnostic, including                            collection of specimen(s) by brushing or washing,  when performed (separate procedure) Diagnosis Code(s):        --- Professional ---                           K64.8, Other hemorrhoids                           K92.1, Melena (includes Hematochezia)                           D62, Acute posthemorrhagic anemia                           K57.30, Diverticulosis of large intestine without                            perforation or abscess without bleeding CPT copyright 2019 American Medical Association. All rights reserved. The codes documented in this report are preliminary and upon coder review may  be revised to meet current compliance requirements. Arta Silence, MD 09/25/2020 9:54:59 AM This report has been signed electronically. Number of Addenda: 0

## 2020-09-25 NOTE — Anesthesia Postprocedure Evaluation (Signed)
Anesthesia Post Note  Patient: Sharryn Belding  Procedure(s) Performed: COLONOSCOPY WITH PROPOFOL     Patient location during evaluation: PACU Anesthesia Type: MAC Level of consciousness: awake and alert Pain management: pain level controlled Vital Signs Assessment: post-procedure vital signs reviewed and stable Respiratory status: spontaneous breathing, nonlabored ventilation, respiratory function stable and patient connected to nasal cannula oxygen Cardiovascular status: stable and blood pressure returned to baseline Postop Assessment: no apparent nausea or vomiting Anesthetic complications: no   No notable events documented.  Last Vitals:  Vitals:   09/25/20 1015 09/25/20 1200  BP: (!) 133/92   Pulse: 88   Resp: 18   Temp:  (!) 35.5 C  SpO2: 95%     Last Pain:  Vitals:   09/25/20 1200  TempSrc: Axillary  PainSc:                  Jordi Lacko

## 2020-09-25 NOTE — Transfer of Care (Signed)
Immediate Anesthesia Transfer of Care Note  Patient: Nayelie Gionfriddo  Procedure(s) Performed: Procedure(s): COLONOSCOPY WITH PROPOFOL (N/A)  Patient Location: PACU  Anesthesia Type:MAC  Level of Consciousness:  sedated, patient cooperative and responds to stimulation  Airway & Oxygen Therapy:Patient Spontanous Breathing and Patient connected to face mask oxgen  Post-op Assessment:  Report given to PACU RN and Post -op Vital signs reviewed and stable  Post vital signs:  Reviewed and stable  Last Vitals:  Vitals:   09/25/20 0841 09/25/20 0949  BP: (!) 157/81 (!) 98/50  Pulse: 65 80  Resp: 20 (!) 6  Temp: 36.6 C   SpO2: 80% 99%    Complications: No apparent anesthesia complications

## 2020-09-26 LAB — BASIC METABOLIC PANEL
Anion gap: 8 (ref 5–15)
BUN: 16 mg/dL (ref 8–23)
CO2: 26 mmol/L (ref 22–32)
Calcium: 9.3 mg/dL (ref 8.9–10.3)
Chloride: 108 mmol/L (ref 98–111)
Creatinine, Ser: 1.36 mg/dL — ABNORMAL HIGH (ref 0.44–1.00)
GFR, Estimated: 41 mL/min — ABNORMAL LOW (ref >60)
Glucose, Bld: 80 mg/dL (ref 70–99)
Potassium: 4.7 mmol/L (ref 3.5–5.1)
Sodium: 142 mmol/L (ref 135–145)

## 2020-09-26 LAB — CBC WITH DIFFERENTIAL/PLATELET
Abs Immature Granulocytes: 0.01 10*3/uL (ref 0.00–0.07)
Basophils Absolute: 0 10*3/uL (ref 0.0–0.1)
Basophils Relative: 1 %
Eosinophils Absolute: 0.2 10*3/uL (ref 0.0–0.5)
Eosinophils Relative: 7 %
HCT: 34.7 % — ABNORMAL LOW (ref 36.0–46.0)
Hemoglobin: 10.4 g/dL — ABNORMAL LOW (ref 12.0–15.0)
Immature Granulocytes: 0 %
Lymphocytes Relative: 24 %
Lymphs Abs: 0.8 10*3/uL (ref 0.7–4.0)
MCH: 27.1 pg (ref 26.0–34.0)
MCHC: 30 g/dL (ref 30.0–36.0)
MCV: 90.4 fL (ref 80.0–100.0)
Monocytes Absolute: 0.4 10*3/uL (ref 0.1–1.0)
Monocytes Relative: 11 %
Neutro Abs: 2 10*3/uL (ref 1.7–7.7)
Neutrophils Relative %: 57 %
Platelets: 308 10*3/uL (ref 150–400)
RBC: 3.84 MIL/uL — ABNORMAL LOW (ref 3.87–5.11)
RDW: 22 % — ABNORMAL HIGH (ref 11.5–15.5)
WBC: 3.5 10*3/uL — ABNORMAL LOW (ref 4.0–10.5)
nRBC: 0 % (ref 0.0–0.2)

## 2020-09-26 MED ORDER — FERROUS SULFATE 325 (65 FE) MG PO TABS: 325.0000 mg | ORAL_TABLET | Freq: Every day | ORAL | 1 refills | Status: AC

## 2020-09-26 MED ORDER — PANTOPRAZOLE SODIUM 40 MG PO TBEC: 40.0000 mg | DELAYED_RELEASE_TABLET | Freq: Every day | ORAL | 1 refills | Status: AC

## 2020-09-26 NOTE — Progress Notes (Signed)
Patient will be going home with family later this day. Belongings and home medications were returned to the patient. Education on medications will be provided.

## 2020-09-26 NOTE — Progress Notes (Signed)
Kelly Curtis 9:51 AM  Subjective: Patient feeling well without any complaints and no signs of bleeding and wants to go home capsule leads taken off last night early  Objective: Vital signs stable afebrile no acute distress hemoglobin stable  Assessment: Improved  Plan: Okay with me to go home I will look at her capsule endoscopy report later this weekend although it was probably not complete and we will call her on Tuesday barring a significant finding and set up follow-up as needed with her primary gastroenterologist Dr. Therisa Doyne  [facility]  office [phone] After 5PM or if no answer call [phone]

## 2020-09-26 NOTE — Discharge Summary (Signed)
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  Patient was also having dyspnea.  On presentation FOBT was positive.  Patient also reported bright red blood per rectum in the stool and with wiping for last couple of weeks.  Also reported rectal pain, burning and itching.  On presentation hemoglobin was in the range of 6.  She takes naproxen chronically.  GI consulted and following.  Status post EGD/possible without finding of any clear source of bleeding.  Underwent video capsule study, results are pending, gastroenterology cleared for discharge and will interpret the results in the weekend and call for follow-up appointment if necessary.  She is medically stable for discharge home today.  Hemoglobin currently stable.  Following problems were addressed during her hospitalization:   Acute blood loss anemia: Hemoglobin was in the range of 7 on presentation , dropped to the range of 6.  FOBT was positive on presentation .she was transfused with  2 units of PRBC. Currently hemoglobin stable in the range of 10 today.  She underwent EGD without finding of any source of bleeding. EGD showed normal esophagus, gastritis.  Colonoscopy showed hemorrhoids, diverticulosis, internal hemorrhoids but no source of bleeding, no active bleeding.   Iron was low as per iron studies.  S/P  a dose of IV iron.  We will supplement iron on discharge.  Underwent video capsule study, results are  pending, gastroenterology cleared for discharge and will interpret the results in the weekend and call for follow-up appointment if necessary.  She has been instructed to discontinue naproxen.  Continue Protonix on discharge.  History of dysphagia: GI were following.  No finding of stenosis or stricture as per EGD.   AKI on CKD stage II: Creatinine was in the range of 1.6 on presentation. Likely secondary to dehydration. Currently at baseline.   Chronic diastolic congestive heart failure: Currently euvolemic   Chronic pain syndrome/erythromelalgia: Has erythematous discoloration of bilateral hands and feet associated with pain.  On intrathecal  pump.  Also on methadone and Dilaudid.  She was also taking naproxen at home.  Continue supportive care.  Follows with dermatology.    Discharge Diagnoses:  Principal Problem:   Acute blood loss anemia Active Problems:   Erythromelalgia ([place])   Depression with anxiety   Chronic pain   Chronic diastolic CHF (congestive heart failure) (HCC)   Hypothermia   Acute kidney injury superimposed on chronic kidney disease (HCC)   Insomnia   Acute GI bleeding    Discharge Instructions  Discharge Instructions     Diet - low sodium heart healthy   Complete by: As directed    Discharge instructions   Complete by: As directed    1)Please take prescribed medications as instructed 2)You will be called by her gastroenterologist 's office for follow-up appointment 3) follow-up with your PCP in a week.  Do a CBC test to check hemoglobin during the follow-up.   Increase activity slowly   Complete by: As directed    No wound care   Complete by: As directed  Allergies as of 09/26/2020       Reactions   Pregabalin    Other reaction(s): Other, Suicidal ideations Suicidal ideations   Demerol  [meperidine Hcl] Nausea And Vomiting   Ferrous Sulfate Nausea And Vomiting   Other reaction(s): upset stomach   Gabapentin Other (See Comments)    Suicidal ideation (03/23/18 - pt currently is getting small amount in pain pump with no reaction)   Hydroxyzine Other (See Comments)   hallucinations   Macrolides And Ketolides Swelling   Other reaction(s): Significant swelling   Midazolam Other (See Comments)   Bradycardia   Trazodone And Nefazodone Other (See Comments)   dizziness   Ambien [zolpidem] Other (See Comments)   unknown   Band-aid Infection Defense [bacitracin-polymyxin B] Swelling   redness   Latex    Other reaction(s): rash   Zolpidem Tartrate    Other reaction(s): "feels crazy"   Escitalopram Oxalate Other (See Comments)   confusion   Penicillins Rash   DID THE REACTION INVOLVE: Swelling of the face/tongue/throat, SOB, or low BP? No Sudden or severe rash/hives, skin peeling, or the inside of the mouth or nose? Yes Did it require medical treatment? Yes When did it last happen?     75 years old  If all above answers are "NO", may proceed with cephalosporin use.   Vancomycin Swelling, Rash        Medication List     STOP taking these medications    aspirin EC 81 MG tablet   naproxen 500 MG tablet Commonly known as: NAPROSYN   omeprazole 20 MG capsule Commonly known as: PRILOSEC       TAKE these medications    acetaminophen 500 MG tablet Commonly known as: TYLENOL Take 1,000 mg by mouth every 6 (six) hours as needed for moderate pain.   Belsomra 10 MG Tabs Generic drug: Suvorexant Take 10 mg by mouth daily.   buPROPion 150 MG 24 hr tablet Commonly known as: WELLBUTRIN XL Take 150 mg by mouth daily.   busPIRone 5 MG tablet Commonly known as: BUSPAR Take 5 mg by mouth 2 (two) times daily.   calcium carbonate 500 MG chewable tablet Commonly known as: TUMS - dosed in mg elemental calcium Chew 500 mg by mouth daily as needed for indigestion or heartburn.   cetirizine 10 MG tablet Commonly known as: ZYRTEC Take 10 mg by mouth daily.   cycloSPORINE 0.05 % ophthalmic emulsion Commonly known  as: RESTASIS Place 1 drop into both eyes 2 (two) times daily.   dimenhyDRINATE 50 MG tablet Commonly known as: DRAMAMINE Take 50 mg by mouth every 8 (eight) hours as needed for nausea or dizziness.   diphenhydrAMINE 25 MG tablet Commonly known as: BENADRYL Take 25 mg by mouth every 6 (six) hours as needed for itching or sleep.   ferrous sulfate 325 (65 FE) MG tablet Take 1 tablet (325 mg total) by mouth daily with breakfast.   fluticasone 50 MCG/ACT nasal spray Commonly known as: FLONASE Place 1 spray into both nostrils daily as needed for allergies or rhinitis.   furosemide 20 MG tablet Commonly known as: LASIX Take 20 mg by mouth 2 (two) times daily as needed for fluid.   glycerin Supp Place 1 Chip rectally daily as needed for moderate constipation.   HYDROmorphone 2 MG tablet Commonly known as: DILAUDID Take 2 mg by mouth every 4 (four) hours as needed for severe pain.   levETIRAcetam 250 MG tablet Commonly known as: KEPPRA Take 250 mg  by mouth 2 (two) times daily.   magnesium gluconate 500 MG tablet Commonly known as: MAGONATE Take 500 mg by mouth daily.   melatonin 5 MG Tabs Take 15 mg by mouth at bedtime.   methadone 10 MG tablet Commonly known as: DOLOPHINE Take 10 mg by mouth every 6 (six) hours.   midodrine 10 MG tablet Commonly known as: PROAMATINE Take 10 mg by mouth 3 (three) times daily.   mirtazapine 15 MG tablet Commonly known as: REMERON Take 7.5 mg by mouth at bedtime.   multivitamin with minerals Tabs tablet Take 1 tablet by mouth daily.   naloxone 4 MG/0.1ML Liqd nasal spray kit Commonly known as: NARCAN Place 1 spray into the nose once as needed (opiod overdose).   NON FORMULARY 1 Dose by Implant route continuous. Intrathecal pump - Gabapentin - 22.107 mg /day + clonidine 110.54 mcg /day   ondansetron 8 MG tablet Commonly known as: ZOFRAN Take 8 mg by mouth every 8 (eight) hours as needed for nausea or vomiting.   pantoprazole 40  MG tablet Commonly known as: Protonix Take 1 tablet (40 mg total) by mouth daily.   PARoxetine 10 MG tablet Commonly known as: PAXIL Take 10 mg by mouth daily.   polyethylene glycol 17 g packet Commonly known as: MIRALAX / GLYCOLAX Take 17 g by mouth daily.   prednisoLONE acetate 1 % ophthalmic suspension Commonly known as: PRED FORTE Place 1 drop into the left eye 4 (four) times daily.   PREPARATION H EX Apply 1 application topically daily.   SYSTANE HYDRATION PF OP Place 1 drop into both eyes 2 (two) times daily.   tiZANidine 4 MG tablet Commonly known as: ZANAFLEX Take 4 mg by mouth 3 (three) times daily as needed for muscle spasms.   triamcinolone 0.025 % cream Commonly known as: KENALOG Apply 1 application topically 2 (two) times daily.        Follow-up Information     Stoneking, Hal, MD. Schedule an appointment as soon as possible for a visit in 1 week(s).   Specialty: Internal Medicine Contact information: [address] [place] [address] [phone]         Ronnette Juniper, MD. Schedule an appointment as soon as possible for a visit in 2 week(s).   Specialty: Gastroenterology Contact information: [address] [place] [address] [phone]                Allergies  Allergen Reactions   Pregabalin     Other reaction(s): Other, Suicidal ideations Suicidal ideations    Demerol  [Meperidine Hcl] Nausea And Vomiting   Ferrous Sulfate Nausea And Vomiting    Other reaction(s): upset stomach   Gabapentin Other (See Comments)    Suicidal ideation (03/23/18 - pt currently is getting small amount in pain pump with no reaction)   Hydroxyzine Other (See Comments)    hallucinations   Macrolides And Ketolides Swelling    Other reaction(s): Significant swelling   Midazolam Other (See Comments)    Bradycardia   Trazodone And Nefazodone Other (See Comments)    dizziness   Ambien [Zolpidem] Other (See Comments)     unknown   Band-Aid Infection Defense [Bacitracin-Polymyxin B] Swelling    redness   Latex     Other reaction(s): rash   Zolpidem Tartrate     Other reaction(s): "feels crazy"   Escitalopram Oxalate Other (See Comments)    confusion   Penicillins Rash  DID THE REACTION INVOLVE: Swelling of the face/tongue/throat, SOB, or low BP? No Sudden or severe rash/hives, skin peeling, or the inside of the mouth or nose? Yes Did it require medical treatment? Yes When did it last happen?     75 years old  If all above answers are "NO", may proceed with cephalosporin use.   Vancomycin Swelling and Rash    Consultations: GI   Procedures/Studies: No results found.    Subjective: Patient seen and examined the bedside this morning.  Hemodynamically stable for discharge today.  Discharge Exam: Vitals:   09/25/20 2125 09/26/20 0138  BP: 111/70 113/88  Pulse: 63 68  Resp: 15 16  Temp: 98.4 F (36.9 C) 98 F (36.7 C)  SpO2: 93% 96%   Vitals:   09/25/20 1322 09/25/20 1730 09/25/20 2125 09/26/20 0138  BP: 139/90 136/78 111/70 113/88  Pulse: (!) 58 69 63 68  Resp: '19 18 15 16  ' Temp: (!) 97.5 F (36.4 C) 98.1 F (36.7 C) 98.4 F (36.9 C) 98 F (36.7 C)  TempSrc: Oral Oral Oral   SpO2: 94% 95% 93% 96%  Weight:      Height:        General: Pt is alert, awake, not in acute distress Cardiovascular: RRR, S1/S2 +, no rubs, no gallops Respiratory: CTA bilaterally, no wheezing, no rhonchi Abdominal: Soft, NT, ND, bowel sounds + Extremities: no edema, no cyanosis, erythematous discoloration on the hands and feet    The results of significant diagnostics from this hospitalization (including imaging, microbiology, ancillary and laboratory) are listed below for reference.     Microbiology: Recent Results (from the past 240 hour(s))  Resp Panel by RT-PCR (Flu A&B, Covid) Nasopharyngeal Swab     Status: None   Collection Time: 09/23/20  7:25 PM   Specimen: Nasopharyngeal Swab;  Nasopharyngeal(NP) swabs in vial transport medium  Result Value Ref Range Status   SARS Coronavirus 2 by RT PCR NEGATIVE NEGATIVE Final    Comment: (NOTE) SARS-CoV-2 target nucleic acids are NOT DETECTED.  The SARS-CoV-2 RNA is generally detectable in upper respiratory specimens during the acute phase of infection. The lowest concentration of SARS-CoV-2 viral copies this assay can detect is 138 copies/mL. A negative result does not preclude SARS-Cov-2 infection and should not be used as the sole basis for treatment or other patient management decisions. A negative result may occur with  improper specimen collection/handling, submission of specimen other than nasopharyngeal swab, presence of viral mutation(s) within the areas targeted by this assay, and inadequate number of viral copies(<138 copies/mL). A negative result must be combined with clinical observations, patient history, and epidemiological information. The expected result is Negative.  Fact Sheet for Patients:  EntrepreneurPulse.com.au  Fact Sheet for Healthcare Providers:  IncredibleEmployment.be  This test is no t yet approved or cleared by the [place] FDA and  has been authorized for detection and/or diagnosis of SARS-CoV-2 by FDA under an Emergency Use Authorization (EUA). This EUA will remain  in effect (meaning this test can be used) for the duration of the COVID-19 declaration under Section 564(b)(1) of the Act, 21 U.S.C.section 360bbb-3(b)(1), unless the authorization is terminated  or revoked sooner.       Influenza A by PCR NEGATIVE NEGATIVE Final   Influenza B by PCR NEGATIVE NEGATIVE Final    Comment: (NOTE) The Xpert Xpress SARS-CoV-2/FLU/RSV plus assay is intended as an aid in the diagnosis of influenza from Nasopharyngeal swab specimens and should not be used as a sole  basis for treatment. Nasal washings and aspirates are unacceptable for Xpert Xpress  SARS-CoV-2/FLU/RSV testing.  Fact Sheet for Patients: EntrepreneurPulse.com.au  Fact Sheet for Healthcare Providers: IncredibleEmployment.be  This test is not yet approved or cleared by the [place] FDA and has been authorized for detection and/or diagnosis of SARS-CoV-2 by FDA under an Emergency Use Authorization (EUA). This EUA will remain in effect (meaning this test can be used) for the duration of the COVID-19 declaration under Section 564(b)(1) of the Act, 21 U.S.C. section 360bbb-3(b)(1), unless the authorization is terminated or revoked.  Performed at [facility], [place] [address] [place], [place] [address]      Labs: BNP (last 3 results) No results for input(s): BNP in the last 8760 hours. Basic Metabolic Panel: Recent Labs  Lab 09/23/20 1328 09/25/20 0230 09/26/20 0613  NA 140 141 142  K 4.6 4.8 4.7  CL 101 104 108  CO2 33* 33* 26  GLUCOSE 90 80 80  BUN 46* 24* 16  CREATININE 1.64* 1.26* 1.36*  CALCIUM 8.9 8.4* 9.3   Liver Function Tests: Recent Labs  Lab 09/23/20 1328  AST 21  ALT 16  ALKPHOS 139*  BILITOT 0.7  PROT 6.1*  ALBUMIN 3.2*   No results for input(s): LIPASE, AMYLASE in the last 168 hours. No results for input(s): AMMONIA in the last 168 hours. CBC: Recent Labs  Lab 09/23/20 1328 09/24/20 0242 09/24/20 1219 09/26/20 0613  WBC 9.1 4.0 5.4 3.5*  NEUTROABS 7.0  --   --  2.0  HGB 7.6* 6.6* 9.4* 10.4*  HCT 28.3* 23.5* 31.0* 34.7*  MCV 91.3 91.8 88.6 90.4  PLT 383 330 331 308   Cardiac Enzymes: No results for input(s): CKTOTAL, CKMB, CKMBINDEX, TROPONINI in the last 168 hours. BNP: Invalid input(s): POCBNP CBG: No results for input(s): GLUCAP in the last 168 hours. D-Dimer No results for input(s): DDIMER in the last 72 hours. Hgb A1c No results for input(s): HGBA1C in the last 72 hours. Lipid Profile No results for input(s): CHOL, HDL, LDLCALC, TRIG, CHOLHDL,  LDLDIRECT in the last 72 hours. Thyroid function studies No results for input(s): TSH, T4TOTAL, T3FREE, THYROIDAB in the last 72 hours.  Invalid input(s): FREET3 Anemia work up Recent Labs    09/24/20 0242  VITAMINB12 332  FOLATE 7.5  TIBC 433  IRON 24*   Urinalysis    Component Value Date/Time   COLORURINE YELLOW 09/24/2020 [place] 09/24/2020 1141   LABSPEC 1.014 09/24/2020 1141   PHURINE 6.0 09/24/2020 1141   GLUCOSEU NEGATIVE 09/24/2020 1141   [place] 09/24/2020 1141   [place] 09/24/2020 1141   [place] 09/24/2020 1141   PROTEINUR NEGATIVE 09/24/2020 1141   NITRITE NEGATIVE 09/24/2020 1141   LEUKOCYTESUR MODERATE (A) 09/24/2020 1141   Sepsis Labs Invalid input(s): PROCALCITONIN,  WBC,  LACTICIDVEN Microbiology Recent Results (from the past 240 hour(s))  Resp Panel by RT-PCR (Flu A&B, Covid) Nasopharyngeal Swab     Status: None   Collection Time: 09/23/20  7:25 PM   Specimen: Nasopharyngeal Swab; Nasopharyngeal(NP) swabs in vial transport medium  Result Value Ref Range Status   SARS Coronavirus 2 by RT PCR NEGATIVE NEGATIVE Final    Comment: (NOTE) SARS-CoV-2 target nucleic acids are NOT DETECTED.  The SARS-CoV-2 RNA is generally detectable in upper respiratory specimens during the acute phase of infection. The lowest concentration of SARS-CoV-2 viral copies this assay can detect is 138 copies/mL. A negative result does not preclude SARS-Cov-2  infection and should not be used as the sole basis for treatment or other patient management decisions. A negative result may occur with  improper specimen collection/handling, submission of specimen other than nasopharyngeal swab, presence of viral mutation(s) within the areas targeted by this assay, and inadequate number of viral copies(<138 copies/mL). A negative result must be combined with clinical observations, patient history, and epidemiological information. The  expected result is Negative.  Fact Sheet for Patients:  EntrepreneurPulse.com.au  Fact Sheet for Healthcare Providers:  IncredibleEmployment.be  This test is no t yet approved or cleared by the [place] FDA and  has been authorized for detection and/or diagnosis of SARS-CoV-2 by FDA under an Emergency Use Authorization (EUA). This EUA will remain  in effect (meaning this test can be used) for the duration of the COVID-19 declaration under Section 564(b)(1) of the Act, 21 U.S.C.section 360bbb-3(b)(1), unless the authorization is terminated  or revoked sooner.       Influenza A by PCR NEGATIVE NEGATIVE Final   Influenza B by PCR NEGATIVE NEGATIVE Final    Comment: (NOTE) The Xpert Xpress SARS-CoV-2/FLU/RSV plus assay is intended as an aid in the diagnosis of influenza from Nasopharyngeal swab specimens and should not be used as a sole basis for treatment. Nasal washings and aspirates are unacceptable for Xpert Xpress SARS-CoV-2/FLU/RSV testing.  Fact Sheet for Patients: EntrepreneurPulse.com.au  Fact Sheet for Healthcare Providers: IncredibleEmployment.be  This test is not yet approved or cleared by the [place] FDA and has been authorized for detection and/or diagnosis of SARS-CoV-2 by FDA under an Emergency Use Authorization (EUA). This EUA will remain in effect (meaning this test can be used) for the duration of the COVID-19 declaration under Section 564(b)(1) of the Act, 21 U.S.C. section 360bbb-3(b)(1), unless the authorization is terminated or revoked.  Performed at [facility], [place] [address] [place], [place] [address]     Please note: You were cared for by a hospitalist during your hospital stay. Once you are discharged, your primary care physician will handle any further medical issues. Please note that NO REFILLS for any discharge medications will be authorized  once you are discharged, as it is imperative that you return to your primary care physician (or establish a relationship with a primary care physician if you do not have one) for your post hospital discharge needs so that they can reassess your need for medications and monitor your lab values.    Time coordinating discharge: 40 minutes  SIGNED:   Shelly Coss, MD  [facility] 09/26/2020, 11:12 AM Pager [phone]  If 7PM-7AM, please contact night-coverage [email or web address] Password [email or web address]

## 2020-09-26 NOTE — Procedures (Signed)
PROCEDURE NOTE  09/23/2020 - 09/25/2020  10:54 AM  PATIENT:  Kelly Curtis  75 y.o. female  PRE-OPERATIVE DIAGNOSIS: Anemia questionable GI blood loss  POST-OPERATIVE DIAGNOSIS: Same  PROCEDURE: Capsule endoscopy  SURGEON:  Surgeon(s): Arta Silence, MD/Makhya Arave  ASSESSMENT/FINDINGS: Capsule only left the stomach briefly to enter the duodenal bulb and then returned to the stomach without significant findings or signs of bleeding  PLAN OF CARE: Okay to go home and follow-up with her primary gastroenterologist Dr. Therisa Doyne to decide if repeat study or other work-up and plans are needed and based on the study will probably need her capsule to be endoscopically placed in the small bowel if it is truly needed to be done and please call me if I can be of any further assistance with this hospital stay

## 2020-09-30 DIAGNOSIS — G4700 Insomnia, unspecified: Secondary | ICD-10-CM | POA: Diagnosis not present

## 2020-09-30 DIAGNOSIS — F325 Major depressive disorder, single episode, in full remission: Secondary | ICD-10-CM | POA: Diagnosis not present

## 2020-09-30 DIAGNOSIS — I129 Hypertensive chronic kidney disease with stage 1 through stage 4 chronic kidney disease, or unspecified chronic kidney disease: Secondary | ICD-10-CM | POA: Diagnosis not present

## 2020-09-30 DIAGNOSIS — D508 Other iron deficiency anemias: Secondary | ICD-10-CM | POA: Diagnosis not present

## 2020-09-30 DIAGNOSIS — N1831 Chronic kidney disease, stage 3a: Secondary | ICD-10-CM | POA: Diagnosis not present

## 2020-10-02 ENCOUNTER — Encounter: Payer: Medicare Other | Admitting: Physical Therapy

## 2020-10-06 DIAGNOSIS — D508 Other iron deficiency anemias: Secondary | ICD-10-CM | POA: Diagnosis not present

## 2020-10-06 DIAGNOSIS — I7381 Erythromelalgia: Secondary | ICD-10-CM | POA: Diagnosis not present

## 2020-10-06 DIAGNOSIS — N179 Acute kidney failure, unspecified: Secondary | ICD-10-CM | POA: Diagnosis not present

## 2020-10-06 DIAGNOSIS — I129 Hypertensive chronic kidney disease with stage 1 through stage 4 chronic kidney disease, or unspecified chronic kidney disease: Secondary | ICD-10-CM | POA: Diagnosis not present

## 2020-10-06 DIAGNOSIS — I5189 Other ill-defined heart diseases: Secondary | ICD-10-CM | POA: Diagnosis not present

## 2020-10-06 DIAGNOSIS — D62 Acute posthemorrhagic anemia: Secondary | ICD-10-CM | POA: Diagnosis not present

## 2020-10-06 DIAGNOSIS — N1831 Chronic kidney disease, stage 3a: Secondary | ICD-10-CM | POA: Diagnosis not present

## 2020-10-07 DIAGNOSIS — R110 Nausea: Secondary | ICD-10-CM | POA: Diagnosis not present

## 2020-10-07 DIAGNOSIS — K5903 Drug induced constipation: Secondary | ICD-10-CM | POA: Diagnosis not present

## 2020-10-07 DIAGNOSIS — Z862 Personal history of diseases of the blood and blood-forming organs and certain disorders involving the immune mechanism: Secondary | ICD-10-CM | POA: Diagnosis not present

## 2020-10-08 DIAGNOSIS — K5903 Drug induced constipation: Secondary | ICD-10-CM | POA: Diagnosis not present

## 2020-10-08 DIAGNOSIS — M79672 Pain in left foot: Secondary | ICD-10-CM | POA: Diagnosis not present

## 2020-10-08 DIAGNOSIS — M792 Neuralgia and neuritis, unspecified: Secondary | ICD-10-CM | POA: Diagnosis not present

## 2020-10-08 DIAGNOSIS — M5442 Lumbago with sciatica, left side: Secondary | ICD-10-CM | POA: Diagnosis not present

## 2020-10-08 DIAGNOSIS — M79641 Pain in right hand: Secondary | ICD-10-CM | POA: Diagnosis not present

## 2020-10-08 DIAGNOSIS — G8929 Other chronic pain: Secondary | ICD-10-CM | POA: Diagnosis not present

## 2020-10-08 DIAGNOSIS — M79671 Pain in right foot: Secondary | ICD-10-CM | POA: Diagnosis not present

## 2020-10-08 DIAGNOSIS — I7381 Erythromelalgia: Secondary | ICD-10-CM | POA: Diagnosis not present

## 2020-10-08 DIAGNOSIS — R519 Headache, unspecified: Secondary | ICD-10-CM | POA: Diagnosis not present

## 2020-10-08 DIAGNOSIS — Z79899 Other long term (current) drug therapy: Secondary | ICD-10-CM | POA: Diagnosis not present

## 2020-10-08 DIAGNOSIS — M79642 Pain in left hand: Secondary | ICD-10-CM | POA: Diagnosis not present

## 2020-10-08 DIAGNOSIS — Z978 Presence of other specified devices: Secondary | ICD-10-CM | POA: Diagnosis not present

## 2020-10-09 ENCOUNTER — Encounter: Payer: Medicare Other | Admitting: Physical Therapy

## 2020-10-20 DIAGNOSIS — M5432 Sciatica, left side: Secondary | ICD-10-CM | POA: Diagnosis not present

## 2020-10-20 DIAGNOSIS — M5417 Radiculopathy, lumbosacral region: Secondary | ICD-10-CM | POA: Diagnosis not present

## 2020-10-20 DIAGNOSIS — B0229 Other postherpetic nervous system involvement: Secondary | ICD-10-CM | POA: Diagnosis not present

## 2020-10-20 DIAGNOSIS — G8929 Other chronic pain: Secondary | ICD-10-CM | POA: Diagnosis not present

## 2020-10-20 DIAGNOSIS — M5431 Sciatica, right side: Secondary | ICD-10-CM | POA: Diagnosis not present

## 2020-10-20 DIAGNOSIS — M5442 Lumbago with sciatica, left side: Secondary | ICD-10-CM | POA: Diagnosis not present

## 2020-10-21 DIAGNOSIS — Z961 Presence of intraocular lens: Secondary | ICD-10-CM | POA: Diagnosis not present

## 2020-10-21 DIAGNOSIS — H04123 Dry eye syndrome of bilateral lacrimal glands: Secondary | ICD-10-CM | POA: Diagnosis not present

## 2020-10-21 DIAGNOSIS — H26491 Other secondary cataract, right eye: Secondary | ICD-10-CM | POA: Diagnosis not present

## 2020-10-21 DIAGNOSIS — H18413 Arcus senilis, bilateral: Secondary | ICD-10-CM | POA: Diagnosis not present

## 2020-11-16 DIAGNOSIS — D508 Other iron deficiency anemias: Secondary | ICD-10-CM | POA: Diagnosis not present

## 2020-11-16 DIAGNOSIS — I129 Hypertensive chronic kidney disease with stage 1 through stage 4 chronic kidney disease, or unspecified chronic kidney disease: Secondary | ICD-10-CM | POA: Diagnosis not present

## 2020-11-16 DIAGNOSIS — Z862 Personal history of diseases of the blood and blood-forming organs and certain disorders involving the immune mechanism: Secondary | ICD-10-CM | POA: Diagnosis not present

## 2020-11-16 DIAGNOSIS — N1831 Chronic kidney disease, stage 3a: Secondary | ICD-10-CM | POA: Diagnosis not present

## 2020-11-20 DIAGNOSIS — Z862 Personal history of diseases of the blood and blood-forming organs and certain disorders involving the immune mechanism: Secondary | ICD-10-CM | POA: Diagnosis not present

## 2020-12-03 DIAGNOSIS — M79642 Pain in left hand: Secondary | ICD-10-CM | POA: Diagnosis not present

## 2020-12-03 DIAGNOSIS — K5903 Drug induced constipation: Secondary | ICD-10-CM | POA: Diagnosis not present

## 2020-12-03 DIAGNOSIS — Z978 Presence of other specified devices: Secondary | ICD-10-CM | POA: Diagnosis not present

## 2020-12-03 DIAGNOSIS — M5417 Radiculopathy, lumbosacral region: Secondary | ICD-10-CM | POA: Diagnosis not present

## 2020-12-03 DIAGNOSIS — B0229 Other postherpetic nervous system involvement: Secondary | ICD-10-CM | POA: Diagnosis not present

## 2020-12-03 DIAGNOSIS — M79671 Pain in right foot: Secondary | ICD-10-CM | POA: Diagnosis not present

## 2020-12-03 DIAGNOSIS — M5442 Lumbago with sciatica, left side: Secondary | ICD-10-CM | POA: Diagnosis not present

## 2020-12-03 DIAGNOSIS — R519 Headache, unspecified: Secondary | ICD-10-CM | POA: Diagnosis not present

## 2020-12-03 DIAGNOSIS — M79641 Pain in right hand: Secondary | ICD-10-CM | POA: Diagnosis not present

## 2020-12-03 DIAGNOSIS — M792 Neuralgia and neuritis, unspecified: Secondary | ICD-10-CM | POA: Diagnosis not present

## 2020-12-03 DIAGNOSIS — I7381 Erythromelalgia: Secondary | ICD-10-CM | POA: Diagnosis not present

## 2020-12-03 DIAGNOSIS — Z79899 Other long term (current) drug therapy: Secondary | ICD-10-CM | POA: Diagnosis not present

## 2021-01-11 DIAGNOSIS — H18413 Arcus senilis, bilateral: Secondary | ICD-10-CM | POA: Diagnosis not present

## 2021-01-11 DIAGNOSIS — M25511 Pain in right shoulder: Secondary | ICD-10-CM | POA: Diagnosis not present

## 2021-01-11 DIAGNOSIS — Z96611 Presence of right artificial shoulder joint: Secondary | ICD-10-CM | POA: Diagnosis not present

## 2021-01-11 DIAGNOSIS — Z961 Presence of intraocular lens: Secondary | ICD-10-CM | POA: Diagnosis not present

## 2021-01-11 DIAGNOSIS — H04123 Dry eye syndrome of bilateral lacrimal glands: Secondary | ICD-10-CM | POA: Diagnosis not present

## 2021-01-11 DIAGNOSIS — H16233 Neurotrophic keratoconjunctivitis, bilateral: Secondary | ICD-10-CM | POA: Diagnosis not present

## 2021-01-15 DIAGNOSIS — Z20822 Contact with and (suspected) exposure to covid-19: Secondary | ICD-10-CM | POA: Diagnosis not present

## 2021-01-20 DIAGNOSIS — M79672 Pain in left foot: Secondary | ICD-10-CM | POA: Diagnosis not present

## 2021-01-20 DIAGNOSIS — Z5181 Encounter for therapeutic drug level monitoring: Secondary | ICD-10-CM | POA: Diagnosis not present

## 2021-01-20 DIAGNOSIS — R519 Headache, unspecified: Secondary | ICD-10-CM | POA: Diagnosis not present

## 2021-01-20 DIAGNOSIS — M5417 Radiculopathy, lumbosacral region: Secondary | ICD-10-CM | POA: Diagnosis not present

## 2021-01-20 DIAGNOSIS — M79671 Pain in right foot: Secondary | ICD-10-CM | POA: Diagnosis not present

## 2021-01-20 DIAGNOSIS — I7381 Erythromelalgia: Secondary | ICD-10-CM | POA: Diagnosis not present

## 2021-01-20 DIAGNOSIS — M79642 Pain in left hand: Secondary | ICD-10-CM | POA: Diagnosis not present

## 2021-01-20 DIAGNOSIS — Z79899 Other long term (current) drug therapy: Secondary | ICD-10-CM | POA: Diagnosis not present

## 2021-01-20 DIAGNOSIS — M79641 Pain in right hand: Secondary | ICD-10-CM | POA: Diagnosis not present

## 2021-01-20 DIAGNOSIS — M792 Neuralgia and neuritis, unspecified: Secondary | ICD-10-CM | POA: Diagnosis not present

## 2021-03-10 DIAGNOSIS — H16233 Neurotrophic keratoconjunctivitis, bilateral: Secondary | ICD-10-CM | POA: Diagnosis not present

## 2021-03-10 DIAGNOSIS — H26491 Other secondary cataract, right eye: Secondary | ICD-10-CM | POA: Diagnosis not present

## 2021-03-10 DIAGNOSIS — H04123 Dry eye syndrome of bilateral lacrimal glands: Secondary | ICD-10-CM | POA: Diagnosis not present

## 2021-03-10 DIAGNOSIS — H18413 Arcus senilis, bilateral: Secondary | ICD-10-CM | POA: Diagnosis not present

## 2021-03-11 DIAGNOSIS — M79642 Pain in left hand: Secondary | ICD-10-CM | POA: Diagnosis not present

## 2021-03-11 DIAGNOSIS — M792 Neuralgia and neuritis, unspecified: Secondary | ICD-10-CM | POA: Diagnosis not present

## 2021-03-11 DIAGNOSIS — M79672 Pain in left foot: Secondary | ICD-10-CM | POA: Diagnosis not present

## 2021-03-11 DIAGNOSIS — I7381 Erythromelalgia: Secondary | ICD-10-CM | POA: Diagnosis not present

## 2021-03-11 DIAGNOSIS — M79671 Pain in right foot: Secondary | ICD-10-CM | POA: Diagnosis not present

## 2021-03-11 DIAGNOSIS — M5459 Other low back pain: Secondary | ICD-10-CM | POA: Diagnosis not present

## 2021-03-11 DIAGNOSIS — R519 Headache, unspecified: Secondary | ICD-10-CM | POA: Diagnosis not present

## 2021-03-11 DIAGNOSIS — K5903 Drug induced constipation: Secondary | ICD-10-CM | POA: Diagnosis not present

## 2021-03-11 DIAGNOSIS — Z978 Presence of other specified devices: Secondary | ICD-10-CM | POA: Diagnosis not present

## 2021-03-11 DIAGNOSIS — M79641 Pain in right hand: Secondary | ICD-10-CM | POA: Diagnosis not present

## 2021-03-30 DIAGNOSIS — I129 Hypertensive chronic kidney disease with stage 1 through stage 4 chronic kidney disease, or unspecified chronic kidney disease: Secondary | ICD-10-CM | POA: Diagnosis not present

## 2021-03-30 DIAGNOSIS — F325 Major depressive disorder, single episode, in full remission: Secondary | ICD-10-CM | POA: Diagnosis not present

## 2021-03-30 DIAGNOSIS — Z1389 Encounter for screening for other disorder: Secondary | ICD-10-CM | POA: Diagnosis not present

## 2021-03-30 DIAGNOSIS — I7381 Erythromelalgia: Secondary | ICD-10-CM | POA: Diagnosis not present

## 2021-03-30 DIAGNOSIS — R413 Other amnesia: Secondary | ICD-10-CM | POA: Diagnosis not present

## 2021-03-30 DIAGNOSIS — N1831 Chronic kidney disease, stage 3a: Secondary | ICD-10-CM | POA: Diagnosis not present

## 2021-03-30 DIAGNOSIS — Z23 Encounter for immunization: Secondary | ICD-10-CM | POA: Diagnosis not present

## 2021-03-30 DIAGNOSIS — Z0000 Encounter for general adult medical examination without abnormal findings: Secondary | ICD-10-CM | POA: Diagnosis not present

## 2021-03-30 DIAGNOSIS — I5189 Other ill-defined heart diseases: Secondary | ICD-10-CM | POA: Diagnosis not present

## 2021-04-28 DIAGNOSIS — M792 Neuralgia and neuritis, unspecified: Secondary | ICD-10-CM | POA: Diagnosis not present

## 2021-04-28 DIAGNOSIS — R519 Headache, unspecified: Secondary | ICD-10-CM | POA: Diagnosis not present

## 2021-04-28 DIAGNOSIS — M79641 Pain in right hand: Secondary | ICD-10-CM | POA: Diagnosis not present

## 2021-04-28 DIAGNOSIS — I7381 Erythromelalgia: Secondary | ICD-10-CM | POA: Diagnosis not present

## 2021-04-28 DIAGNOSIS — M79672 Pain in left foot: Secondary | ICD-10-CM | POA: Diagnosis not present

## 2021-04-28 DIAGNOSIS — B0229 Other postherpetic nervous system involvement: Secondary | ICD-10-CM | POA: Diagnosis not present

## 2021-04-28 DIAGNOSIS — M79642 Pain in left hand: Secondary | ICD-10-CM | POA: Diagnosis not present

## 2021-04-28 DIAGNOSIS — Z978 Presence of other specified devices: Secondary | ICD-10-CM | POA: Diagnosis not present

## 2021-04-28 DIAGNOSIS — M5459 Other low back pain: Secondary | ICD-10-CM | POA: Diagnosis not present

## 2021-04-28 DIAGNOSIS — M79671 Pain in right foot: Secondary | ICD-10-CM | POA: Diagnosis not present

## 2021-04-28 DIAGNOSIS — K5903 Drug induced constipation: Secondary | ICD-10-CM | POA: Diagnosis not present

## 2021-05-13 DIAGNOSIS — Z20822 Contact with and (suspected) exposure to covid-19: Secondary | ICD-10-CM | POA: Diagnosis not present

## 2021-05-26 DIAGNOSIS — Z20822 Contact with and (suspected) exposure to covid-19: Secondary | ICD-10-CM | POA: Diagnosis not present

## 2021-06-11 DIAGNOSIS — N1831 Chronic kidney disease, stage 3a: Secondary | ICD-10-CM | POA: Diagnosis not present

## 2021-06-11 DIAGNOSIS — I129 Hypertensive chronic kidney disease with stage 1 through stage 4 chronic kidney disease, or unspecified chronic kidney disease: Secondary | ICD-10-CM | POA: Diagnosis not present

## 2021-06-11 DIAGNOSIS — F325 Major depressive disorder, single episode, in full remission: Secondary | ICD-10-CM | POA: Diagnosis not present

## 2021-06-16 DIAGNOSIS — M5459 Other low back pain: Secondary | ICD-10-CM | POA: Diagnosis not present

## 2021-06-16 DIAGNOSIS — M792 Neuralgia and neuritis, unspecified: Secondary | ICD-10-CM | POA: Diagnosis not present

## 2021-06-21 DIAGNOSIS — Z20822 Contact with and (suspected) exposure to covid-19: Secondary | ICD-10-CM | POA: Diagnosis not present

## 2021-06-26 DIAGNOSIS — Z20822 Contact with and (suspected) exposure to covid-19: Secondary | ICD-10-CM | POA: Diagnosis not present

## 2021-07-12 DIAGNOSIS — I129 Hypertensive chronic kidney disease with stage 1 through stage 4 chronic kidney disease, or unspecified chronic kidney disease: Secondary | ICD-10-CM | POA: Diagnosis not present

## 2021-07-12 DIAGNOSIS — N1831 Chronic kidney disease, stage 3a: Secondary | ICD-10-CM | POA: Diagnosis not present

## 2021-07-12 DIAGNOSIS — I5189 Other ill-defined heart diseases: Secondary | ICD-10-CM | POA: Diagnosis not present

## 2021-07-12 DIAGNOSIS — F325 Major depressive disorder, single episode, in full remission: Secondary | ICD-10-CM | POA: Diagnosis not present

## 2021-07-12 DIAGNOSIS — G4700 Insomnia, unspecified: Secondary | ICD-10-CM | POA: Diagnosis not present

## 2021-07-22 DIAGNOSIS — Z20822 Contact with and (suspected) exposure to covid-19: Secondary | ICD-10-CM | POA: Diagnosis not present

## 2021-07-23 DIAGNOSIS — Z20822 Contact with and (suspected) exposure to covid-19: Secondary | ICD-10-CM | POA: Diagnosis not present

## 2021-07-26 DIAGNOSIS — Z20822 Contact with and (suspected) exposure to covid-19: Secondary | ICD-10-CM | POA: Diagnosis not present

## 2021-08-03 DIAGNOSIS — Z978 Presence of other specified devices: Secondary | ICD-10-CM | POA: Diagnosis not present

## 2021-08-03 DIAGNOSIS — R519 Headache, unspecified: Secondary | ICD-10-CM | POA: Diagnosis not present

## 2021-08-03 DIAGNOSIS — Z5181 Encounter for therapeutic drug level monitoring: Secondary | ICD-10-CM | POA: Diagnosis not present

## 2021-08-03 DIAGNOSIS — G894 Chronic pain syndrome: Secondary | ICD-10-CM | POA: Diagnosis not present

## 2021-08-03 DIAGNOSIS — M792 Neuralgia and neuritis, unspecified: Secondary | ICD-10-CM | POA: Diagnosis not present

## 2021-08-03 DIAGNOSIS — Z79899 Other long term (current) drug therapy: Secondary | ICD-10-CM | POA: Diagnosis not present

## 2021-08-03 DIAGNOSIS — I7381 Erythromelalgia: Secondary | ICD-10-CM | POA: Diagnosis not present

## 2021-08-11 DIAGNOSIS — H16233 Neurotrophic keratoconjunctivitis, bilateral: Secondary | ICD-10-CM | POA: Diagnosis not present

## 2021-08-11 DIAGNOSIS — H04123 Dry eye syndrome of bilateral lacrimal glands: Secondary | ICD-10-CM | POA: Diagnosis not present

## 2021-08-11 DIAGNOSIS — H02831 Dermatochalasis of right upper eyelid: Secondary | ICD-10-CM | POA: Diagnosis not present

## 2021-08-11 DIAGNOSIS — H18413 Arcus senilis, bilateral: Secondary | ICD-10-CM | POA: Diagnosis not present

## 2021-08-26 DIAGNOSIS — I7381 Erythromelalgia: Secondary | ICD-10-CM | POA: Diagnosis not present

## 2021-08-26 DIAGNOSIS — D539 Nutritional anemia, unspecified: Secondary | ICD-10-CM | POA: Diagnosis not present

## 2021-08-26 DIAGNOSIS — D649 Anemia, unspecified: Secondary | ICD-10-CM | POA: Diagnosis not present

## 2021-08-26 DIAGNOSIS — R600 Localized edema: Secondary | ICD-10-CM | POA: Diagnosis not present

## 2021-08-30 DIAGNOSIS — E875 Hyperkalemia: Secondary | ICD-10-CM | POA: Diagnosis not present

## 2021-08-30 DIAGNOSIS — I509 Heart failure, unspecified: Secondary | ICD-10-CM | POA: Diagnosis not present

## 2021-08-30 DIAGNOSIS — R600 Localized edema: Secondary | ICD-10-CM | POA: Diagnosis not present

## 2021-09-03 DIAGNOSIS — R600 Localized edema: Secondary | ICD-10-CM | POA: Diagnosis not present

## 2021-09-03 DIAGNOSIS — E875 Hyperkalemia: Secondary | ICD-10-CM | POA: Diagnosis not present

## 2021-09-06 DIAGNOSIS — H16223 Keratoconjunctivitis sicca, not specified as Sjogren's, bilateral: Secondary | ICD-10-CM | POA: Diagnosis not present

## 2021-09-06 DIAGNOSIS — H04123 Dry eye syndrome of bilateral lacrimal glands: Secondary | ICD-10-CM | POA: Diagnosis not present

## 2021-09-23 DIAGNOSIS — M79671 Pain in right foot: Secondary | ICD-10-CM | POA: Diagnosis not present

## 2021-09-23 DIAGNOSIS — Z5181 Encounter for therapeutic drug level monitoring: Secondary | ICD-10-CM | POA: Diagnosis not present

## 2021-09-23 DIAGNOSIS — Z79899 Other long term (current) drug therapy: Secondary | ICD-10-CM | POA: Diagnosis not present

## 2021-09-23 DIAGNOSIS — M79642 Pain in left hand: Secondary | ICD-10-CM | POA: Diagnosis not present

## 2021-09-23 DIAGNOSIS — M79672 Pain in left foot: Secondary | ICD-10-CM | POA: Diagnosis not present

## 2021-09-23 DIAGNOSIS — M5459 Other low back pain: Secondary | ICD-10-CM | POA: Diagnosis not present

## 2021-09-23 DIAGNOSIS — M79641 Pain in right hand: Secondary | ICD-10-CM | POA: Diagnosis not present

## 2021-10-04 DIAGNOSIS — T68XXXA Hypothermia, initial encounter: Secondary | ICD-10-CM | POA: Diagnosis not present

## 2021-10-04 DIAGNOSIS — K921 Melena: Secondary | ICD-10-CM | POA: Diagnosis not present

## 2021-10-04 DIAGNOSIS — R197 Diarrhea, unspecified: Secondary | ICD-10-CM | POA: Diagnosis not present

## 2021-10-04 DIAGNOSIS — R269 Unspecified abnormalities of gait and mobility: Secondary | ICD-10-CM | POA: Diagnosis not present

## 2021-10-04 DIAGNOSIS — R680 Hypothermia, not associated with low environmental temperature: Secondary | ICD-10-CM | POA: Diagnosis not present

## 2021-10-05 DIAGNOSIS — T68XXXA Hypothermia, initial encounter: Secondary | ICD-10-CM | POA: Diagnosis not present

## 2021-10-11 DIAGNOSIS — G894 Chronic pain syndrome: Secondary | ICD-10-CM | POA: Diagnosis not present

## 2021-10-11 DIAGNOSIS — M7989 Other specified soft tissue disorders: Secondary | ICD-10-CM | POA: Diagnosis not present

## 2021-10-11 DIAGNOSIS — N179 Acute kidney failure, unspecified: Secondary | ICD-10-CM | POA: Diagnosis not present

## 2021-10-11 DIAGNOSIS — I7381 Erythromelalgia: Secondary | ICD-10-CM | POA: Diagnosis not present

## 2021-10-11 DIAGNOSIS — R944 Abnormal results of kidney function studies: Secondary | ICD-10-CM | POA: Diagnosis not present

## 2021-10-11 DIAGNOSIS — K921 Melena: Secondary | ICD-10-CM | POA: Diagnosis not present

## 2021-10-28 ENCOUNTER — Encounter: Payer: Self-pay | Admitting: Family Medicine

## 2021-10-28 ENCOUNTER — Telehealth: Payer: Self-pay

## 2021-10-28 ENCOUNTER — Other Ambulatory Visit: Payer: Medicare Other | Admitting: Family Medicine

## 2021-10-28 DIAGNOSIS — Z515 Encounter for palliative care: Secondary | ICD-10-CM | POA: Diagnosis not present

## 2021-10-28 NOTE — Progress Notes (Signed)
[facility] Consult Note Telephone: [phone]  Fax: [phone]   Date of encounter: 10/28/21 1:04 PM PATIENT NAME: Kelly Curtis [name] [address]   [phone] (home) [phone] (work) DOB: 04-07-1945 MRN: [id number] PRIMARY CARE PROVIDER:    [name], MD,  [place] [place] [address] [phone]  REFERRING PROVIDER:   [name], MD [address] [place],  [place] [address] [phone]  RESPONSIBLE PARTY:    Contact Information     Name Relation Home Work [place] Relative   [phone]   Kelly Curtis Sister [phone]  [phone]     I connected with  Kelly Curtis on 10/28/21 by telephone as she was unable to hold a video device and verified that I am speaking with the correct person using two identifiers.   I discussed the limitations of evaluation and management by telemedicine. The patient expressed understanding and agreed to proceed.    Palliative Care was asked to follow this patient by consultation request of  [name], MD to address advance care planning and complex medical decision making. This is the initial visit.          ASSESSMENT, SYMPTOM MANAGEMENT AND PLAN / RECOMMENDATIONS:   Palliative Care Encounter Continue follow up for [facility] POA and goals of care Assess and assist coping mechanisms for grief with ex-spouse loss  2.  CKD/acute blood loss anemia Encourage adequate fluid intake Check on plan to monitor H and H, no increased thrombocytopenia Last Hgb/hct in system from 1 year ago  Follow up Palliative Care Visit: Palliative care will continue to follow for complex medical decision making, advance care planning, and clarification of goals. Return 3-4 weeks or prn.    This visit was coded based on medical decision making (MDM).  PPS: 50%  HOSPICE ELIGIBILITY/DIAGNOSIS: TBD  Chief Complaint:  [facility] received a referral to follow up with patient for chronic disease management in setting of chronic heart failure and erythromelalgia.    HISTORY OF PRESENT ILLNESS:  Kelly Curtis is a 76 y.o. year old female with chronic CHF, erythromelalgia with chronic pain, recent GI bleed with IDA, CKD, depression, BLE edema, GERD,  and HTN.  She states most of her allergies are more like intolerance of the medications, not true allergies.  Widespread pain in feet, hands, face and ears. "I'm in bed 99% of the time." Does not sleep much during the day.  On 2 oral narcotics that help with pain, takes Miralax daily to help with opiod induced constipation. She is bathing and dressing self, get meals except the neighbors help on bad days.  Her sister lives about 5 blocks away. EM causes difficulty going out in the summer.  Wears depends at night, not incontinent during the day.  Has some orthopnea at times.  Has adjustable Queen sized bed that can elevate head and feet.  Has PND which has been gradually developing.  Has frequent nausea which responds to Zofran.  Her sister Kelly Curtis brings her meds and caregiver gives it.  Appetite waxes and wanes, no significant weight loss.  Has had progressive weight gain.  Weight yesterday 157 lbs.  Has significant pitting edema in both legs which does not subside overnight.  Has not had a fall in several months.  Caregivers from [facility].  She was close to her ex-husband who died the week before last. She does have an intrathecal Baclofen  pump which gives her a cocktail of Clonidine, gabapentin in the pump.  History obtained from review of EMR, discussion with Ms. Kelly Curtis.    I reviewed available labs, medications, imaging, studies and related documents from the EMR.  Records reviewed and summarized above.   ROS General: NAD ENMT: denies dysphagia Cardiovascular: denies chest pain, denies DOE Pulmonary: denies cough, denies increased SOB Abdomen: endorses  fair appetite, endorses constipation, endorses continence of bowel except when sleeping GU: denies dysuria, endorses continence of urine except when sleeping MSK:  endorses gradually increased weakness, no falls reported Skin: denies rashes or wounds Neurological: denies pain, denies insomnia Psych: Endorses positive mood Heme/lymph/immuno: denies bruises, abnormal bleeding at present  Physical Exam: limited to audible exam only Current and past weights:  General: pleasant and cooperative ENMT: slightly weak/soft tone of speech CV: able to speak in complete sentences without having to stop to breathe Pulmonary: no audible rales, rhonchi or cough   CURRENT PROBLEM LIST:  Patient Active Problem List   Diagnosis Date Noted   Acute GI bleeding 09/24/2020   Acute blood loss anemia 09/23/2020   Hypothermia 09/23/2020   Acute kidney injury superimposed on chronic kidney disease ([facility]) 09/23/2020   Insomnia 09/23/2020   Chronic diastolic CHF (congestive heart failure) ([facility]) 06/16/2020   Iron deficiency anemia 06/16/2020   Abdominal wall abscess 02/09/2020   Chronic pain 02/09/2020   HTN (hypertension) 03/24/2018   Acute on chronic diastolic CHF (congestive heart failure) ([place]) 03/23/2018   Microcytic anemia 03/23/2018   Leg swelling 03/23/2018   Erythromelalgia ([place])    GERD (gastroesophageal reflux disease)    Depression with anxiety    PAST MEDICAL HISTORY:  Active Ambulatory Problems    Diagnosis Date Noted   Acute on chronic diastolic CHF (congestive heart failure) ([place]) 03/23/2018   Microcytic anemia 03/23/2018   Erythromelalgia (HCC)    GERD (gastroesophageal reflux disease)    Depression with anxiety    Leg swelling 03/23/2018   HTN (hypertension) 03/24/2018   Abdominal wall abscess 02/09/2020   Chronic pain 02/09/2020   Chronic diastolic CHF (congestive heart failure) ([facility]) 06/16/2020   Iron deficiency anemia 06/16/2020   Acute blood loss anemia 09/23/2020    Hypothermia 09/23/2020   Acute kidney injury superimposed on chronic kidney disease ([facility] [place] [facility]) 09/23/2020   Insomnia 09/23/2020   Acute GI bleeding 09/24/2020   Resolved Ambulatory Problems    Diagnosis Date Noted   AKI (acute kidney injury) ([place]) 03/24/2018   Pancreatitis 06/15/2020   Intractable abdominal pain 06/16/2020   Acute lower UTI 06/16/2020   Past Medical History:  Diagnosis Date   Arthritis    Cancer ([place])    Chronic kidney disease    Constipation due to pain medication    DVT (deep venous thrombosis) (HCC)    Dysrhythmia    Erythermalgia (HCC)    Headache    History of blood transfusion    Hypotension    Mitral valve disorder    SOCIAL HX:  Social History   Tobacco Use   Smoking status: Former    Years: 32.00    Types: Cigarettes    Quit date: 2000    Years since quitting: 23.6   Smokeless tobacco: Never  Substance Use Topics   Alcohol use: Never   FAMILY HX:  Family History  Problem Relation Age of Onset   Heart disease Mother    Alzheimer's disease Mother    Kidney disease Father    Melanoma Sister    Heart disease  Brother        Preferred Pharmacy: ALLERGIES:  Allergies  Allergen Reactions   Pregabalin     Other reaction(s): Other, Suicidal ideations Suicidal ideations    Demerol  [Meperidine Hcl] Nausea And Vomiting   Ferrous Sulfate Nausea And Vomiting    Other reaction(s): upset stomach   Gabapentin Other (See Comments)    Suicidal ideation (03/23/18 - pt currently is getting small amount in pain pump with no reaction)   Hydroxyzine Other (See Comments)    hallucinations   Macrolides And Ketolides Swelling    Other reaction(s): Significant swelling   Midazolam Other (See Comments)    Bradycardia   Trazodone And Nefazodone Other (See Comments)    dizziness   Ambien [Zolpidem] Other (See Comments)    unknown   Band-Aid Infection Defense [Bacitracin-Polymyxin B] Swelling    redness   Latex     Other reaction(s): rash   Zolpidem  Tartrate     Other reaction(s): "feels crazy"   Escitalopram Oxalate Other (See Comments)    confusion   Penicillins Rash    DID THE REACTION INVOLVE: Swelling of the face/tongue/throat, SOB, or low BP? No Sudden or severe rash/hives, skin peeling, or the inside of the mouth or nose? Yes Did it require medical treatment? Yes When did it last happen?     76 years old  If all above answers are "NO", may proceed with cephalosporin use.   Vancomycin Swelling and Rash     PERTINENT MEDICATIONS:  Outpatient Encounter Medications as of 10/28/2021  Medication Sig   acetaminophen (TYLENOL) 500 MG tablet Take 1,000 mg by mouth every 6 (six) hours as needed for moderate pain.   buPROPion (WELLBUTRIN XL) 150 MG 24 hr tablet Take 150 mg by mouth daily.    busPIRone (BUSPAR) 5 MG tablet Take 5 mg by mouth 2 (two) times daily.   calcium carbonate (TUMS - DOSED IN MG ELEMENTAL CALCIUM) 500 MG chewable tablet Chew 500 mg by mouth daily as needed for indigestion or heartburn.   cetirizine (ZYRTEC) 10 MG tablet Take 10 mg by mouth daily.   cycloSPORINE (RESTASIS) 0.05 % ophthalmic emulsion Place 1 drop into both eyes 2 (two) times daily.   dimenhyDRINATE (DRAMAMINE) 50 MG tablet Take 50 mg by mouth every 8 (eight) hours as needed for nausea or dizziness.   diphenhydrAMINE (BENADRYL) 25 MG tablet Take 25 mg by mouth every 6 (six) hours as needed for itching or sleep.   ferrous sulfate 325 (65 FE) MG tablet Take 1 tablet (325 mg total) by mouth daily with breakfast.   fluticasone (FLONASE) 50 MCG/ACT nasal spray Place 1 spray into both nostrils daily as needed for allergies or rhinitis.   furosemide (LASIX) 20 MG tablet Take 20 mg by mouth 2 (two) times daily as needed for fluid.   glycerin SUPP Place 1 Chip rectally daily as needed for moderate constipation.   HYDROmorphone (DILAUDID) 2 MG tablet Take 2 mg by mouth every 4 (four) hours as needed for severe pain.   levETIRAcetam (KEPPRA) 250 MG tablet Take 250  mg by mouth 2 (two) times daily.    Lidocaine-Glycerin (PREPARATION H EX) Apply 1 application topically daily.   magnesium gluconate (MAGONATE) 500 MG tablet Take 500 mg by mouth daily.   melatonin 5 MG TABS Take 15 mg by mouth at bedtime.   methadone (DOLOPHINE) 10 MG tablet Take 10 mg by mouth every 6 (six) hours.   midodrine (PROAMATINE) 10 MG tablet Take  10 mg by mouth 3 (three) times daily.   mirtazapine (REMERON) 15 MG tablet Take 7.5 mg by mouth at bedtime.   Multiple Vitamin (MULTIVITAMIN WITH MINERALS) TABS tablet Take 1 tablet by mouth daily.   naloxone (NARCAN) nasal spray 4 mg/0.1 mL Place 1 spray into the nose once as needed (opiod overdose).   NON FORMULARY 1 Dose by Implant route continuous. Intrathecal pump - Gabapentin - 22.107 mg /day + clonidine 110.54 mcg /day   ondansetron (ZOFRAN) 8 MG tablet Take 8 mg by mouth every 8 (eight) hours as needed for nausea or vomiting.   pantoprazole (PROTONIX) 40 MG tablet Take 1 tablet (40 mg total) by mouth daily.   PARoxetine (PAXIL) 10 MG tablet Take 10 mg by mouth daily.    Polyethyl Glycol-Propyl Glycol (SYSTANE HYDRATION PF OP) Place 1 drop into both eyes 2 (two) times daily.   polyethylene glycol (MIRALAX / GLYCOLAX) 17 g packet Take 17 g by mouth daily.   prednisoLONE acetate (PRED FORTE) 1 % ophthalmic suspension Place 1 drop into the left eye 4 (four) times daily.   Suvorexant (BELSOMRA) 10 MG TABS Take 10 mg by mouth daily.   tiZANidine (ZANAFLEX) 4 MG tablet Take 4 mg by mouth 3 (three) times daily as needed for muscle spasms.    triamcinolone (KENALOG) 0.025 % cream Apply 1 application topically 2 (two) times daily.   No facility-administered encounter medications on file as of 10/28/2021.     ----------------------------------------------------------------------------------------- Advance Care Planning/Goals of Care: Goals include to maximize quality of life and symptom management. 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 Decision not to resuscitate or to de-escalate disease focused treatments due to poor prognosis. CODE STATUS: MOST as of 03/13/20: DNR/DNI with limited additional interventions Antibiotics if indicated No feeding tubes Iv fluids were not addressed  Thank you for the opportunity to participate in the care of Ms. Kelly Curtis.  The palliative care team will continue to follow. Please call our office at [phone] if we can be of additional assistance.   Marijo Conception, FNP-C  COVID-19 PATIENT SCREENING TOOL Asked and negative response unless otherwise noted:  Have you had symptoms of covid, tested positive or been in contact with someone with symptoms/positive test in the past 5-10 days? no

## 2021-10-28 NOTE — Telephone Encounter (Signed)
Spoke with patient and scheduled a telephonic Palliative Consult for 10/28/21 @ 12 PM.   Consent obtained; updated Netsmart, Team List and Epic.

## 2021-11-01 DIAGNOSIS — K921 Melena: Secondary | ICD-10-CM | POA: Diagnosis not present

## 2021-11-17 DIAGNOSIS — H04123 Dry eye syndrome of bilateral lacrimal glands: Secondary | ICD-10-CM | POA: Diagnosis not present

## 2021-11-17 DIAGNOSIS — M25511 Pain in right shoulder: Secondary | ICD-10-CM | POA: Diagnosis not present

## 2021-11-17 DIAGNOSIS — H16223 Keratoconjunctivitis sicca, not specified as Sjogren's, bilateral: Secondary | ICD-10-CM | POA: Diagnosis not present

## 2021-11-22 ENCOUNTER — Encounter: Payer: Self-pay | Admitting: Family Medicine

## 2021-11-22 ENCOUNTER — Telehealth: Payer: Self-pay | Admitting: Family Medicine

## 2021-11-22 ENCOUNTER — Other Ambulatory Visit: Payer: Medicare Other | Admitting: Family Medicine

## 2021-11-22 VITALS — BP 136/72 | HR 71 | Resp 18

## 2021-11-22 DIAGNOSIS — G8929 Other chronic pain: Secondary | ICD-10-CM | POA: Diagnosis not present

## 2021-11-22 DIAGNOSIS — F418 Other specified anxiety disorders: Secondary | ICD-10-CM

## 2021-11-22 DIAGNOSIS — R609 Edema, unspecified: Secondary | ICD-10-CM | POA: Diagnosis not present

## 2021-11-22 DIAGNOSIS — F4321 Adjustment disorder with depressed mood: Secondary | ICD-10-CM

## 2021-11-22 DIAGNOSIS — I7381 Erythromelalgia: Secondary | ICD-10-CM | POA: Diagnosis not present

## 2021-11-22 DIAGNOSIS — K5903 Drug induced constipation: Secondary | ICD-10-CM

## 2021-11-22 DIAGNOSIS — M25511 Pain in right shoulder: Secondary | ICD-10-CM | POA: Diagnosis not present

## 2021-11-22 NOTE — Progress Notes (Signed)
[facility] Consult Note Telephone: [phone]  Fax: [phone]    Date of encounter: 11/22/21 11:17 AM PATIENT NAME: Kelly Curtis [place] [address]   [phone] (home) [phone] (work) DOB: 07-27-1945 MRN: [id number] PRIMARY CARE PROVIDER:    [name], MD,  [place] [place] [address] [phone]  REFERRING PROVIDER:   [name], MD [address] [place],  [place] [address] [phone]  RESPONSIBLE PARTY:    Contact Information     Name Relation Home Work [place] Relative   [phone]   [name],[name] Sister [phone]  [phone]        I met face to face with patient and sister in her home. Palliative Care was asked to follow this patient by consultation request of  [name], MD to address advance care planning and complex medical decision making. This is a follow up visit  ASSESSMENT, SYMPTOM MANAGEMENT AND PLAN / RECOMMENDATIONS:   Chronic right shoulder pain Worsening ROM, edema Question possibility of early clot with hx of erythomelalgia without specific signs as etiology for edema Consulted orthopedic PA office regarding changes in functional and physical exam concerning for possible frozen shoulder to see if steroid course advisable or if should have re-evaluated by ortho versus possible visit to pain management to consider nerve ablation followed by use of PT to regain mobility. Continue current pain medication regimen. Apply ice pack 15 min on/30 min off to help decrease inflammation, edema and pain.  2.   Erythromelalgia Question if has associated myeloproliferative disorder vs increased risk for VTE. Decreased pulse in right radial/increased distal cyanosis of hand and foot on right but difficult to determine if due to edema, temperature slightly cool but equal to left arm. Last CBC in 08/2020 showed normal PLT  count and normal HGB. Question if CBC, ddimer and/or peripheral smear would be useful. Have sent message in Epic to discuss with PCP  3.   Depression with anxiety/grief reaction Also messaged PCP about potential increase in Paxil dose to 20 mg daily. Grief reaction from recent loss of ex husband-will request SW referral for grief counseling and coping strategies.  4.   Unspecified edema Has CHF but edema is not following on dependent side, other physical findings of volume overload are not present. Uncertain etiology-VTE is a possibility  5.    Drug induced constipation (opiods) Encouraged addition of Senokot 1 po BID.     Advance Care Planning/Goals of Care: Goals include to maximize quality of life and symptom management.  Identification of a healthcare agent-sister [name] Review of an advance directive document-MOST Decision not to resuscitate or to de-escalate disease focused treatments due to poor prognosis. CODE STATUS MOST as of 03/13/2020: DNR/DNI with limited additional intervention Use of antibiotics if indicated IV fluids were not addressed No feeding tube.    Follow up Palliative Care Visit: Palliative care will continue to follow for complex medical decision making, advance care planning, and clarification of goals. Return 4 weeks or prn.   This visit was coded based on medical decision making (MDM).  PPS: 50%  HOSPICE ELIGIBILITY/DIAGNOSIS: TBD  Chief Complaint:  Palliative Care is following patient for chronic medical management in setting of heart failure and erythromelalgia.  HISTORY OF PRESENT ILLNESS:  Kelly Curtis is a 76 y.o. year old female with chronic diastolic heart failure, HTN, erythromelalgia affecting hands/feet/face/ears, GERD, CKD and chronic right shoulder pain.  Last year  she had been consistently taking anti-inflammatory med and was noted to have acute GI bleed and blood loss anemia which has been stable.  She also has an implanted  intrathecal pain pump per pain management in addition to Methadone and MS Contin.  She c/o increased right shoulder pain.  [place] last about 6-8 weeks ago, started having pain about 2 weeks ago in right shoulder with continuous pain of about a 6 which increases with movement and now with limitations in ROM. She has had swelling in right arm and right leg which does not seem to be gradually increasing. Prior hx of right shoulder replacement. She thinks that fall was on the right side.  She has discomfort in the right leg.  Sister says swelling has increased with reduction in Lasix to 20 mg daily. Pt lays predominantly on her left side, does get OOB and ambulate some daily.  Denies CP, SOB.  Has DOE. No PND.  Sleeps on 2 pillows which has not changed.  Pt has had nausea and vomiting, takes Ondansetron which helps some.  She states her appetite is good. Wears depends and has some incontinence of bowel and bladder. Has help as needed for bathing and dressing.  Ambulates with a cane, walker and uses transport wc for MD visits. Not sleeping really well. Having a lot of depression and ex husband died on 12-11-22, were close. She states "I'm ready to not be here." She has requested that NP follow up with her brother-in-law-Greg [name] who is a former paramedic and has good understanding of medical information as pt may be sleeping. Pt is open to grief counseling and increase in dose of Paxil if ok with PCP. Pt c/o constipation and uses Miralax and glycerin suppositories.  History obtained from review of EMR, discussion with sister, and/or [name].   Per Orthopedic PA note: AP and axillary views of the right shoulder obtained in office today and reviewed by me demonstrate no acute bony abnormality.  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  There is significant medial poly wear and slight elevation of the humeral head.  There does not appear to be an acute dislocation.  No sign of  periprosthetic fracture.    I reviewed available labs, medications, imaging, studies and related documents from the EMR with no new records in the last year with the exception of right shoulder xray.  Records reviewed and summarized above.   ROS General: NAD Cardiovascular: denies chest pain, endorses DOE Pulmonary: denies cough, denies increased SOB Abdomen: endorses good appetite and intermittent nausea, endorses constipation, endorses continence of bowel GU: denies dysuria, endorses continence of urine MSK:  endorses weakness, limited ROM/increased pain in right shoulder,  no recent falls reported Skin: denies rashes or wounds Neurological: endorses pain in right shoulder and more pressure in right LE, endorses insomnia Psych: Endorses depression and grief over loss of ex-spouse   Physical Exam: Current and past weights: 170 lbs on 11/17/21 at ortho office Constitutional: NAD General: thin, flushed CV: S1S2, RRR, right radial pulse 1+ /left radial pulse 2+ both arms cool with rubor/cyanosis of hands worse on right.  1+ non-pitting edema of right leg to thigh. Neg homans on right.  No palpable cord in right arm or leg Pulmonary: CTAB, no increased work of breathing, no cough, room air Abdomen: normo-active BS + 4 quadrants, soft and non tender MSK: no sarcopenia, moves all extremities, ambulatory with cane.  Forward extension of right shoulder < 75 degrees, lateral  extension limited to about 75 degrees with inability to extend fully laterally, no IR/ABD/ADD of right shoulder Skin: warm and dry, no rashes or wounds on visible skin Neuro:  noted generalized weakness,  no cognitive impairment Psych: non-anxious affect, A and O x 3, depressed with grief Hem/lymph/immuno: no widespread bruising   Thank you for the opportunity to participate in the care of [name].  The palliative care team will continue to follow. Please call our office at [phone] if we can be of additional  assistance.   Marijo Conception, FNP -C  COVID-19 PATIENT SCREENING TOOL Asked and negative response unless otherwise noted:   Have you had symptoms of covid, tested positive or been in contact with someone with symptoms/positive test in the past 5-10 days?  No

## 2021-11-22 NOTE — Telephone Encounter (Signed)
TCT Dionisio David PA with [facility] in [place] who last saw pt last week. Left message with receptionist regarding changes in physical exam findings for patient including decreasing ROM through all ranges and cannot forward extend from shoulder to even 90 degrees. She also has 1-2+ edema of right arm.  Concern for frozen shoulder and wanted to know if course of steroids would help or should we speak with pain management clinic about possible nerve block to allow pt to do PT and move that arm.  Left cell number for call back. Damaris Hippo FNP-C

## 2021-11-23 ENCOUNTER — Telehealth: Payer: Self-pay | Admitting: Family Medicine

## 2021-11-23 NOTE — Telephone Encounter (Signed)
Follow up after talking with ortho PA today. 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  She states that steroids may help some with inflammation but are unlikely to provide increased ROM and that in her opinion the only help for her is to either have surgery or to see pain management about a possible nerve ablation for the shoulder. Doing PT will likely only further loosen the glenoid portion of the joint which is allowing the humerus to ride higher in the socket.  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  Advised of above findings.  [name] says that pt has had edema of her right side and that always in the past the Lasix has helped her get rid of excess but doesn't seem to be affecting her currently.  Advised of my concern for possible VTE although unusual to be both RUL and RLL at same time.  Spouse states pt had Ddimer at [facility] in the office on 10/11/21 which was negative.  She has had heme positive stool which they were told that was likely upper GI and has been given referral to follow up with [facility] for upper endoscopy but haven't heard anything.  [name] says that Dr [name] is retiring in the next couple of days and sister states pt has been assigned to Dr Rochele Raring who has referred pt to Cardiology. Will follow up with SW as sister states that pt cried all day today, grieving the loss of her ex-husband and stating she doesn't want to live.  Sister's spouse [name] did purchase a specialized ice pack for shoulders but are uncertain if this has been used or helpful today.  Sister is asking that [facility] follow up with her spouse [name] to set up appt  for pt for counseling.  Santiago Glad Abbi Mancini FNP-C Damaris Hippo FNP-C

## 2021-11-24 ENCOUNTER — Telehealth: Payer: Self-pay | Admitting: Family Medicine

## 2021-11-24 NOTE — Telephone Encounter (Signed)
Pt with severe depressive symptoms and passive suicidal thoughts with recent close loss.  Left vm on CMA for Dr Candyce Churn Raju's phone.  Message states Dr Louis Matte will be out of the office until September 5th, 2023.  Advised that I was calling to alert her of increasing poorly controlled depressive symptoms and wanting to increase her Paxil dose from 10 to 20 mg daily.  Per message, I advised that I was going to increase the dose since Dr Louis Matte is not in until next week and to let me know if there was anything else needed.  Damaris Hippo FNP-C

## 2021-11-25 ENCOUNTER — Telehealth: Payer: Self-pay

## 2021-11-25 NOTE — Telephone Encounter (Signed)
SW  completed a referral to [facility] for  grief counseling for patient via email.

## 2021-12-02 DIAGNOSIS — R195 Other fecal abnormalities: Secondary | ICD-10-CM | POA: Diagnosis not present

## 2021-12-02 DIAGNOSIS — M25511 Pain in right shoulder: Secondary | ICD-10-CM | POA: Diagnosis not present

## 2021-12-02 DIAGNOSIS — M7989 Other specified soft tissue disorders: Secondary | ICD-10-CM | POA: Diagnosis not present

## 2021-12-02 DIAGNOSIS — N179 Acute kidney failure, unspecified: Secondary | ICD-10-CM | POA: Diagnosis not present

## 2021-12-02 DIAGNOSIS — F325 Major depressive disorder, single episode, in full remission: Secondary | ICD-10-CM | POA: Diagnosis not present

## 2021-12-02 DIAGNOSIS — E538 Deficiency of other specified B group vitamins: Secondary | ICD-10-CM | POA: Diagnosis not present

## 2021-12-02 DIAGNOSIS — I7381 Erythromelalgia: Secondary | ICD-10-CM | POA: Diagnosis not present

## 2021-12-02 DIAGNOSIS — G894 Chronic pain syndrome: Secondary | ICD-10-CM | POA: Diagnosis not present

## 2021-12-02 DIAGNOSIS — D649 Anemia, unspecified: Secondary | ICD-10-CM | POA: Diagnosis not present

## 2021-12-06 ENCOUNTER — Ambulatory Visit (HOSPITAL_COMMUNITY)
Admission: RE | Admit: 2021-12-06 | Discharge: 2021-12-06 | Disposition: A | Discharge status: Home / Self Care | Payer: Medicare Other | Source: Ambulatory Visit | Attending: Cardiology | Admitting: Cardiology

## 2021-12-06 ENCOUNTER — Other Ambulatory Visit (HOSPITAL_COMMUNITY): Payer: Self-pay

## 2021-12-06 ENCOUNTER — Encounter (HOSPITAL_COMMUNITY): Payer: Self-pay | Admitting: Cardiology

## 2021-12-06 VITALS — BP 94/60 | HR 66 | Wt 168.8 lb

## 2021-12-06 DIAGNOSIS — N183 Chronic kidney disease, stage 3 unspecified: Secondary | ICD-10-CM

## 2021-12-06 DIAGNOSIS — I951 Orthostatic hypotension: Secondary | ICD-10-CM

## 2021-12-06 DIAGNOSIS — I5032 Chronic diastolic (congestive) heart failure: Secondary | ICD-10-CM | POA: Insufficient documentation

## 2021-12-06 DIAGNOSIS — L570 Actinic keratosis: Secondary | ICD-10-CM | POA: Diagnosis not present

## 2021-12-06 LAB — BASIC METABOLIC PANEL
Anion gap: 8 (ref 5–15)
BUN: 56 mg/dL — ABNORMAL HIGH (ref 8–23)
CO2: 32 mmol/L (ref 22–32)
Calcium: 8.8 mg/dL — ABNORMAL LOW (ref 8.9–10.3)
Chloride: 102 mmol/L (ref 98–111)
Creatinine, Ser: 2.7 mg/dL — ABNORMAL HIGH (ref 0.44–1.00)
GFR, Estimated: 18 mL/min — ABNORMAL LOW (ref >60)
Glucose, Bld: 81 mg/dL (ref 70–99)
Potassium: 5 mmol/L (ref 3.5–5.1)
Sodium: 142 mmol/L (ref 135–145)

## 2021-12-06 LAB — BRAIN NATRIURETIC PEPTIDE: B Natriuretic Peptide: 352.6 pg/mL — ABNORMAL HIGH (ref 0.0–100.0)

## 2021-12-06 NOTE — Patient Instructions (Signed)
There has been no changes to your medications.  Labs done today, your results will be available in MyChart, we will contact you for abnormal readings.  You are scheduled for a Cardiac Catheterization on Friday, September 29 with Dr. Loralie Champagne.  1. Please arrive at the [facility] (Main Entrance A) at [facility]: [address] [place], [place] [address] at 7:00 AM (This time is two hours before your procedure to ensure your preparation). Free valet parking service is available.   Special note: Every effort is made to have your procedure done on time. Please understand that emergencies sometimes delay scheduled procedures.  2. Diet: Do not eat solid foods after midnight.  The patient may have clear liquids until 5am upon the day of the procedure.  3. Medication instructions in preparation for your procedure:   Contrast Allergy: No   Stop taking, Lasix (Furosemide)  Friday, September 29,  On the morning of your procedure, take any morning medicines NOT listed above.  You may use sips of water.  5. Plan for one night stay--bring personal belongings. 6. Bring a current list of your medications and current insurance cards. 7. You MUST have a responsible person to drive you home. 8. Someone MUST be with you the first 24 hours after you arrive home or your discharge will be delayed. 9. Please wear clothes that are easy to get on and off and wear slip-on shoes.  Your physician recommends that you schedule a follow-up appointment in: 6 weeks  If you have any questions or concerns before your next appointment please send [place] a message through [place] or call our office at [phone].    TO LEAVE A MESSAGE FOR THE NURSE SELECT OPTION 2, PLEASE LEAVE A MESSAGE INCLUDING: YOUR NAME DATE OF BIRTH CALL BACK NUMBER REASON FOR CALL**this is important as we prioritize the call backs  YOU WILL RECEIVE A CALL BACK THE SAME DAY AS LONG AS YOU CALL BEFORE 4:00 PM  At the [facility], you and your health needs are our priority. As part of our continuing mission to provide you with exceptional heart care, we have created designated Provider Care Teams. These Care Teams include your primary Cardiologist (physician) and Advanced Practice Providers (APPs- Physician Assistants and Nurse Practitioners) who all work together to provide you with the care you need, when you need it.   You may see any of the following providers on your designated Care Team at your next follow up: Dr Glori Bickers Dr Loralie Champagne Dr. Roxana Hires, NP Lyda Jester, [place] [facility] [place], [place] Forestine Na, NP Audry Riles, PharmD   Please be sure to bring in all your medications bottles to every appointment.

## 2021-12-06 NOTE — Progress Notes (Signed)
ReDS Vest / Clip - 12/06/21 1500       ReDS Vest / Clip   Station Marker A    Ruler Value 31    ReDS Value Range Low volume    ReDS Actual Value 26

## 2021-12-07 NOTE — Progress Notes (Signed)
PCP: Charlane Ferretti, MD Cardiology: Dr. Aundra Dubin  76 y.o. 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 Louis Matte for evaluation of CHF.  Patient had mitral valve prolapse necessitating mitral valve repair in 2000.  Patient had atrial fibrillation noted prior to mitral valve repair but has had none noted since.  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  This has led to chronic pain in her hands and feet that has been debilitating. It is difficult for her to walk because of the pain in her feet.  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 She has orthostatic symptoms and takes midodrine. She has developed CKD stage 3 with hyperkalemia necessitating periodic treatment with patiromer.    Over the last 6 months, in addition to redness and pain in her feet and hands, patient has developed peripheral edema affecting the R>L lower leg as well as the right forearm.  Echo was done in 6/23, showing EF 60-65%, normal LV thickness, normal RV, s/p MV repair with mean gradient 8 mmHg, trivial MR, PASP 43 mmHg. There was concern for HFpEF, and Lasix was started.  She took Lasix 40 mg bid initially but did not see improvement with Lasix, so has decreased it to 40 mg daily.  She is unable to wear compression stockings due to pain in her legs.  She does develop dyspnea when she walks across her house though pain in her feet is a more prominent symptom.  No chest pain.  She lives alone but has full-time nursing care.  She reports a venous ultrasound earlier this year that did not show DVT (done by PCP).   REDs clip 26%  ECG (personally reviewed): NSR, QTc 504 msec  Labs (6/23): K 4.4, creatinine 1.4, hgb 9.4  PMH: 1. L-spine arthritis.  2. HTN 3. CKD stage 3 with hyperkalemia.  4. Atrial fibrillation: Noted only in 1999 pre-MV repair.  5. Mitral regurgitation: Mitral valve prolapse.  S/p MV repair in 2000.   6. Diverticulosis 7.  Pancreatitis in 3/22 8. Right shoulder replacement.  9. HFpEF: Echo (6/23) with EF 60-65%, normal LV thickness, normal RV, s/p MV repair with mean gradient 8 mmHg, trivial MR, PASP 43 mmHg.  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   11. H/o vertigo  SH: Lives in [place], has home health aide.  Sister lives nearby.  Remote smoker.  No ETOH.   Family History  Problem Relation Age of Onset   Heart disease Mother    Alzheimer's disease Mother    Kidney disease Father    Melanoma Sister    Heart disease Brother    ROS: All systems reviewed and negative except as per HPI.   Current Outpatient Medications  Medication Sig Dispense Refill   acetaminophen (TYLENOL) 500 MG tablet Take 1,000 mg by mouth every 6 (six) hours as needed for moderate pain.     buPROPion (WELLBUTRIN XL) 150 MG 24 hr tablet Take 150 mg by mouth daily.      busPIRone (BUSPAR) 5 MG tablet Take 10 mg by mouth daily.     calcium carbonate (TUMS - DOSED IN MG ELEMENTAL CALCIUM) 500 MG chewable tablet Chew 500 mg by mouth daily as needed for indigestion or heartburn.     Cenegermin-bkbj (OXERVATE) 0.002 % SOLN Apply 1 drop to eye 2 (two) times daily.     cycloSPORINE (RESTASIS)  0.05 % ophthalmic emulsion Place 1 drop into both eyes 2 (two) times daily.     dimenhyDRINATE (DRAMAMINE) 50 MG tablet Take 50 mg by mouth every 8 (eight) hours as needed for nausea or dizziness.     diphenhydrAMINE (BENADRYL) 25 MG tablet Take 25 mg by mouth every 6 (six) hours as needed for itching or sleep.     ferrous sulfate 325 (65 FE) MG tablet Take 1 tablet (325 mg total) by mouth daily with breakfast. 30 tablet 1   fluticasone (FLONASE) 50 MCG/ACT nasal spray Place 1 spray into both nostrils daily as needed for allergies or rhinitis.     furosemide (LASIX) 20 MG tablet Take 20 mg by mouth 2 (two) times daily as needed for fluid.     glycerin SUPP Place 1 Chip rectally daily as  needed for moderate constipation.     levETIRAcetam (KEPPRA) 250 MG tablet Take 250 mg by mouth 2 (two) times daily.      Lidocaine-Glycerin (PREPARATION H EX) Apply 1 application topically daily.     melatonin 5 MG TABS Take 15 mg by mouth at bedtime.     methadone (DOLOPHINE) 10 MG tablet Take 10 mg by mouth every 6 (six) hours.     midodrine (PROAMATINE) 10 MG tablet Take 10 mg by mouth 3 (three) times daily.     mirtazapine (REMERON) 15 MG tablet Take 7.5 mg by mouth at bedtime.     morphine (MS CONTIN) 15 MG 12 hr tablet Take 15 mg by mouth in the morning, at noon, in the evening, and at bedtime.     Multiple Vitamin (MULTIVITAMIN WITH MINERALS) TABS tablet Take 1 tablet by mouth daily.     naloxone (NARCAN) nasal spray 4 mg/0.1 mL Place 1 spray into the nose once as needed (opiod overdose).     NON FORMULARY 1 Dose by Implant route continuous. Intrathecal pump - Gabapentin - 22.107 mg /day + clonidine 110.54 mcg /day     ondansetron (ZOFRAN) 8 MG tablet Take 8 mg by mouth every 8 (eight) hours as needed for nausea or vomiting.     pantoprazole (PROTONIX) 40 MG tablet Take 1 tablet (40 mg total) by mouth daily. 30 tablet 1   PARoxetine (PAXIL) 10 MG tablet Take 10 mg by mouth daily.      polyethylene glycol (MIRALAX / GLYCOLAX) 17 g packet Take 17 g by mouth daily.     Povidone (IVIZIA DRY EYES OP) Apply 1 drop to eye 2 (two) times daily.     Suvorexant (BELSOMRA) 10 MG TABS Take 10 mg by mouth daily.     tiZANidine (ZANAFLEX) 4 MG tablet Take 4 mg by mouth 3 (three) times daily as needed for muscle spasms.      triamcinolone (KENALOG) 0.025 % cream Apply 1 application topically 2 (two) times daily.     No current facility-administered medications for this encounter.   BP 94/60   Pulse 66   Wt 76.6 kg (168 lb 12.8 oz)   SpO2 94%   BMI 28.97 kg/m  General: NAD Neck: No JVD, no thyromegaly or thyroid nodule.  Lungs: Clear to auscultation bilaterally with normal respiratory  effort. CV: Nondisplaced PMI.  Heart regular S1/S2, no S3/S4, no murmur.  No carotid bruit.  Normal pedal pulses.  Abdomen: Soft, nontender, no hepatosplenomegaly, no distention.  Skin: Intact without lesions or rashes.  Neurologic: Alert and oriented x 3.  Psych: Normal affect. Extremities: Right forearm/hand swelling/edema, redness of hands.  2+ edema to knee on right, 1+ edema to knee on left.  Redness of lower legs bilaterally.  HEENT: Normal.   Assessment/Plan: 1. Chronic diastolic CHF: Echo in 4/40 showed EF 60-65%, normal LV thickness, normal RV, s/p MV repair with mean gradient 8 mmHg, trivial MR, PASP 43 mmHg.  She has prominent peripheral edema but no JVD.  REDs clip 26% is not suggestive of CHF.  Her peripheral edema seems to me to be more related to venous insufficiency than CHF.   I do not have a good explanation for the differential swelling of her right forearm, however.   - I would not increase her Lasix at this time. Check BMET and BNP.  - It may be helpful to decrease Lasix and start SGLT2 inhibitor though I would like to see result of RHC first.  - I recommended that we do a RHC to assess filling pressures and PA pressure.  Though her edema seems less likely to be due to CHF than venous insufficiency, she does have substrate for development of HFpEF (previous MV repair).  We discussed risks/benefits and she agrees to procedure.  - She is unable to tolerate compression stockings.  2. H/o MV repair: Done in 2000 for mitral valve prolapse with MR.  Echo in 6/23 showed trivial MR but mean gradient was increased to 8 mmHg across the valve.  - Should have antibiotic prophylaxis with dental work.  3. Orthostatic hypotension: She is on midodrine.  Symptoms seem to be minimal currently.  4. CKD stage 3: With tendency towards hyperkalemia.   - Check BMET today.   Loralie Champagne 12/07/2021

## 2021-12-08 DIAGNOSIS — M25511 Pain in right shoulder: Secondary | ICD-10-CM | POA: Diagnosis not present

## 2021-12-08 DIAGNOSIS — Z96611 Presence of right artificial shoulder joint: Secondary | ICD-10-CM | POA: Diagnosis not present

## 2021-12-08 DIAGNOSIS — I7381 Erythromelalgia: Secondary | ICD-10-CM | POA: Diagnosis not present

## 2021-12-13 ENCOUNTER — Telehealth: Payer: Self-pay

## 2021-12-13 ENCOUNTER — Other Ambulatory Visit: Payer: Medicare Other | Admitting: Family Medicine

## 2021-12-13 ENCOUNTER — Encounter: Payer: Self-pay | Admitting: Family Medicine

## 2021-12-13 VITALS — HR 127 | Resp 24

## 2021-12-13 DIAGNOSIS — R6520 Severe sepsis without septic shock: Secondary | ICD-10-CM | POA: Diagnosis not present

## 2021-12-13 DIAGNOSIS — I7381 Erythromelalgia: Secondary | ICD-10-CM | POA: Diagnosis not present

## 2021-12-13 DIAGNOSIS — N183 Chronic kidney disease, stage 3 unspecified: Secondary | ICD-10-CM

## 2021-12-13 DIAGNOSIS — J9601 Acute respiratory failure with hypoxia: Secondary | ICD-10-CM | POA: Diagnosis not present

## 2021-12-13 DIAGNOSIS — R197 Diarrhea, unspecified: Secondary | ICD-10-CM | POA: Diagnosis not present

## 2021-12-13 DIAGNOSIS — I5030 Unspecified diastolic (congestive) heart failure: Secondary | ICD-10-CM | POA: Diagnosis not present

## 2021-12-13 DIAGNOSIS — M25511 Pain in right shoulder: Secondary | ICD-10-CM | POA: Diagnosis not present

## 2021-12-13 DIAGNOSIS — K922 Gastrointestinal hemorrhage, unspecified: Secondary | ICD-10-CM | POA: Diagnosis not present

## 2021-12-13 DIAGNOSIS — A419 Sepsis, unspecified organism: Secondary | ICD-10-CM

## 2021-12-13 DIAGNOSIS — K219 Gastro-esophageal reflux disease without esophagitis: Secondary | ICD-10-CM | POA: Diagnosis not present

## 2021-12-13 DIAGNOSIS — I890 Lymphedema, not elsewhere classified: Secondary | ICD-10-CM | POA: Diagnosis not present

## 2021-12-13 DIAGNOSIS — K2901 Acute gastritis with bleeding: Secondary | ICD-10-CM | POA: Diagnosis not present

## 2021-12-13 DIAGNOSIS — I5033 Acute on chronic diastolic (congestive) heart failure: Secondary | ICD-10-CM

## 2021-12-13 DIAGNOSIS — N189 Chronic kidney disease, unspecified: Secondary | ICD-10-CM | POA: Diagnosis not present

## 2021-12-13 DIAGNOSIS — N179 Acute kidney failure, unspecified: Secondary | ICD-10-CM | POA: Diagnosis not present

## 2021-12-13 NOTE — Progress Notes (Signed)
[facility] Pall[facility] Consult Note Telephone: [phone]  Fax: [phone]    Date of encounter: 12/13/21 11:38 AM PATIENT NAME: Kelly Curtis [address] [place] [address]   [phone] (home) [phone] (work) DOB: 11-28-1945 MRN: [id number] PRIMARY CARE PROVIDER:    Charlane Ferretti, MD,  [place] [place] [address] [place] [address] [phone]  REFERRING PROVIDER:   Charlane Ferretti, MD [address] [place],  [place] [address] [phone]  RESPONSIBLE PARTY:    Contact Information     Name Relation Home Work Kelly Curtis Relative   [phone]   Kelly Curtis Sister [phone]  [phone]        I met face to face with patient and family in her home. Palliative Care was asked to follow this patient by consultation request of  Kelly Ferretti, MD to address advance care planning and complex medical decision making. This is a follow up visit   ASSESSMENT , SYMPTOM MANAGEMENT AND PLAN / RECOMMENDATIONS:  Sepsis with acute hypoxic respiratory failure, unspecified organism Likely septic shock and hypovolemia from diarrhea. Wants Hospice Care with transition to [facility]. Hypotensive, tachycardic, tachypneic and hypoxic Discussed with admitting hospice physician who was in agreement with hospice transition. Spoke with Kelly Kelly Curtis, PCP and updated on current condition, agree with prognosis of 6 months or less following current prognosis and is in agreement to remain as attending. Confirmed bed with [facility] Covid 19 PCR negative PPS 20%  2.  Acute on chronic diastolic heart failure Noted venous stasis dermatitis with weeping, particularly on right.  Continues to have significant right sided edema  3.  GI hemorrhage with acute gastritis Diarrhea with black stools since Wednesday  4.  Acute renal failure superimposed on CKD stage 3 Cr baseline 1.2-1.5, recent was 2.70 on 9/11 Likely  related to hypovolemia/chronic bleeding, hypotension despite use of Midodrine.  Advance Care Planning/Goals of Care: Goals include to maximize quality of life and symptom management. Patient/health care surrogate gave his/her permission to discuss. Our advance care planning conversation included a discussion about:    Exploration of personal, cultural or spiritual beliefs that might influence medical decisions-pt has just had pain meds and continues to c/o pain, is asking about other routes of pain control.  Pt refusing hospitalization  Exploration of goals of care in the event of a sudden injury or illness- she and family are in agreement with [employer] Identification of a healthcare agent-sister Kelly Curtis and brother in law Kelly Curtis Review of an advance directive document-MOST . Decision not to resuscitate or to de-escalate disease focused treatments due to poor prognosis. CODE STATUS: DNR/DNI/ LTD (but pt refusing hospital transport) Antibiotics as indicated MOST didn't address IV fluids No feeding tube.     Follow up Palliative Care Visit: Palliative care will arrange admission to [facility]    This visit was coded based on medical decision making (MDM).  PPS: 20%  HOSPICE ELIGIBILITY/DIAGNOSIS: TBD  Chief Complaint:  Acute visit requested by family with pt having increased pain, diarrhea with black stool and no food intake since Wednesday.  HISTORY OF PRESENT ILLNESS:  Kelly Curtis is a 76 y.o. year old female with  chronic diastolic heart failure, HTN, erythromelalgia affecting hands/feet/face/ears, GERD, CKD and chronic right shoulder pain.  Last year she had been consistently taking anti-inflammatory med and was noted to have acute GI bleed and blood loss anemia which has been stable.  She also has  an implanted intrathecal pain pump per pain management in addition to Methadone and MS Contin. Received phone call from brother in law Kelly Curtis that pt was not doing well.  She had  been having diarrhea with black stools since Wednesday and had not eaten.  She was c/o pain and refusing to go to the ER or MD office, wanted referral to Hospice.  On arrival pt is laying on her left side in bed, hands and feet are mottled/cool.  Family said she has fallen 6-8 times over several days because she refuses to stay in bed and insists on getting up to the bathroom. Brother in law reports difficulty with getting her up.  He said she had significant edema of right leg which suddenly opened and leaked a large amount of clear fluid on the floor. Pt has pain from Erythromelalgia in hands, ears and feet.  She has pain in her right shoulder where she had prior replacement which is deteriorating.    She was recently seen by Kelly Curtis, Cardiology who indicated PASP on prior echo was 43 mm HG with EF 60-65% in 2022.  He was recommending a potential SGLT2 inhibitor pending outcome of a RHC and thought right sided swelling was more a factor of venous insufficiency than heart failure in particular since pt did not respond to furosemide but couldn't explain the right arm edema but pt did not have JVD.  She has MV replacement in 2000 with severe mitral stenosis currently. Pt asks if she can't be pushed down the stairs to "get this over with".  She takes MS contin 15 mg Q 6 hrs and Methadone 10 mg every 6 hours.  Last had MS contin 30 mg (15 mg at 11 am, was still uncomfortable at 12:15 pm and was given remaining 15 mg).  Sister accidentally crushed Methadone so only gave half dose  (5 mg) in fluid at 2:45 pm.    History obtained from review of EMR, discussion with family, paid caregiver and/or Kelly Curtis.   BMP 12/06/21: K+ normal at 5.0, Elevated Cr 2.70, BUN 56, low Ca 8.8, eGFR 18 BNP 12/06/21  elevated at 352.6   I reviewed EMR for available labs, medications, imaging, studies and related documents.  There are no new records since last visit/Records reviewed and summarized above.   ROS ENMT:  denies dysphagia Cardiovascular: denies chest pain, denies DOE Pulmonary: endorses cough, denies increased SOB Abdomen: endorses good appetite, denies constipation, endorses continence of bowel GU: denies dysuria, endorses continence of urine MSK:  denies increased weakness,  no falls reported Skin: denies rashes or wounds Neurological: denies pain, denies insomnia Psych: Endorses positive mood Heme/lymph/immuno: denies bruises, abnormal bleeding  Physical Exam: Current and past weights: 168 lbs 12.8 ounces as of 12/06/21 Constitutional: Appears lethargic and chronically ill General: frail appearing   CV: S1S2, regular tachycardic rhythm, no MRG, RLE 1-2+ edema, RUE 2+ edema, venous stasis dermatitis noted over anterior shin on right Pulmonary: Course crackles throughout, worse in BLL.  Non productive cough on room air Abdomen: normo-active BS + 4 quadrants, soft and non tender MSK: no sarcopenia, bedbound for the last several days as she falls whenever attempting to ambulate Skin: Cyanotic mottling with cool periphery of hands/feet.  Noted exfoliation of skin of foot. Deep rubor/ Erythema of anterior right shin with some flaking of skin and weeping of clear fluid Neuro:  Generalized weakness,  no cognitive impairment Psych: non-anxious/depressed affect, asking to die.  Oriented x 2. Hem/lymph/immuno: widespread  bruising noted in varying stages from fall.  Ecchymoses to right posterior shoulder, right forearm, across left lateral buttock.   Thank you for the opportunity to participate in the care of Kelly Curtis.  The palliative care team will continue to follow. Please call our office at [phone] if we can be of additional assistance.   Marijo Conception, FNP -C  COVID-19 PATIENT SCREENING TOOL Asked and negative response unless otherwise noted:   Have you had symptoms of covid, tested positive or been in contact with someone with symptoms/positive test in the past 5-10 days?    Unknown, COVID PCR negative at home today.

## 2021-12-13 NOTE — Telephone Encounter (Signed)
1450- per request of Santiago Glad, NP. Contacted [facility] non emergency number to request transport for patient to [facility] room #2. Spoke with Ovid Curd [phone]) who stated, he would get a truck out as soon as possible for transport. Notified Santiago Glad after call.

## 2021-12-14 DIAGNOSIS — N189 Chronic kidney disease, unspecified: Secondary | ICD-10-CM | POA: Diagnosis not present

## 2021-12-14 DIAGNOSIS — I7381 Erythromelalgia: Secondary | ICD-10-CM | POA: Diagnosis not present

## 2021-12-14 DIAGNOSIS — I890 Lymphedema, not elsewhere classified: Secondary | ICD-10-CM | POA: Diagnosis not present

## 2021-12-14 DIAGNOSIS — K922 Gastrointestinal hemorrhage, unspecified: Secondary | ICD-10-CM | POA: Diagnosis not present

## 2021-12-14 DIAGNOSIS — R197 Diarrhea, unspecified: Secondary | ICD-10-CM | POA: Diagnosis not present

## 2021-12-14 DIAGNOSIS — I5030 Unspecified diastolic (congestive) heart failure: Secondary | ICD-10-CM | POA: Diagnosis not present

## 2021-12-15 DIAGNOSIS — N189 Chronic kidney disease, unspecified: Secondary | ICD-10-CM | POA: Diagnosis not present

## 2021-12-15 DIAGNOSIS — K922 Gastrointestinal hemorrhage, unspecified: Secondary | ICD-10-CM | POA: Diagnosis not present

## 2021-12-15 DIAGNOSIS — I5030 Unspecified diastolic (congestive) heart failure: Secondary | ICD-10-CM | POA: Diagnosis not present

## 2021-12-15 DIAGNOSIS — I7381 Erythromelalgia: Secondary | ICD-10-CM | POA: Diagnosis not present

## 2021-12-15 DIAGNOSIS — R197 Diarrhea, unspecified: Secondary | ICD-10-CM | POA: Diagnosis not present

## 2021-12-15 DIAGNOSIS — I890 Lymphedema, not elsewhere classified: Secondary | ICD-10-CM | POA: Diagnosis not present

## 2021-12-16 ENCOUNTER — Telehealth (HOSPITAL_COMMUNITY): Payer: Self-pay

## 2021-12-16 DIAGNOSIS — I5030 Unspecified diastolic (congestive) heart failure: Secondary | ICD-10-CM | POA: Diagnosis not present

## 2021-12-16 DIAGNOSIS — N189 Chronic kidney disease, unspecified: Secondary | ICD-10-CM | POA: Diagnosis not present

## 2021-12-16 DIAGNOSIS — I890 Lymphedema, not elsewhere classified: Secondary | ICD-10-CM | POA: Diagnosis not present

## 2021-12-16 DIAGNOSIS — R197 Diarrhea, unspecified: Secondary | ICD-10-CM | POA: Diagnosis not present

## 2021-12-16 DIAGNOSIS — I7381 Erythromelalgia: Secondary | ICD-10-CM | POA: Diagnosis not present

## 2021-12-16 DIAGNOSIS — K922 Gastrointestinal hemorrhage, unspecified: Secondary | ICD-10-CM | POA: Diagnosis not present

## 2021-12-22 ENCOUNTER — Other Ambulatory Visit: Payer: Medicare Other | Admitting: Family Medicine

## 2021-12-24 ENCOUNTER — Ambulatory Visit (HOSPITAL_COMMUNITY): Admission: RE | Admit: 2021-12-24 | Payer: Medicare Other | Source: Ambulatory Visit | Admitting: Cardiology

## 2021-12-24 ENCOUNTER — Encounter (HOSPITAL_COMMUNITY): Admission: RE | Payer: Self-pay | Source: Ambulatory Visit

## 2021-12-24 SURGERY — RIGHT HEART CATH
Anesthesia: LOCAL

## 2021-12-26 NOTE — Telephone Encounter (Signed)
Patients RT heart cath cancelled due to patient being transported to hospice care as of 12/13/21

## 2021-12-26 DEATH — deceased

## 2022-01-17 ENCOUNTER — Encounter (HOSPITAL_COMMUNITY): Payer: Medicare Other

## 2022-09-17 IMAGING — CT CT ABD-PELV W/ CM
2 of 5 series · 16 of 46 positions shown, 18 images · IV contrast (omnipaque)
Comparison: None.

CLINICAL DATA: Right-sided abdominal pain since cholecystectomy
12/12/2019, nausea and vomiting

EXAM:
CT ABDOMEN AND PELVIS WITH CONTRAST
TECHNIQUE: Multidetector CT imaging of the abdomen and pelvis was performed
using the standard protocol following bolus administration of
intravenous contrast.
CONTRAST:  100mL OMNIPAQUE IOHEXOL 300 MG/ML  SOLN

[Series 2: axial (person_name) (person_name) · axial · 0.74mm/px · z∈[-431,-41]mm · 13 of 88 slices shown, 15 images]
[im 5/88  soft-tissue]
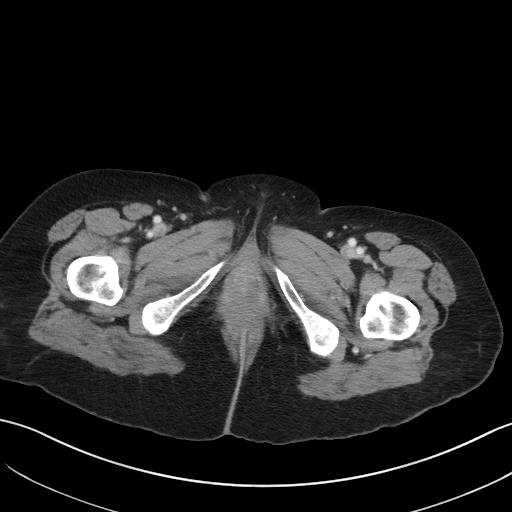
[im 5/88  bone]
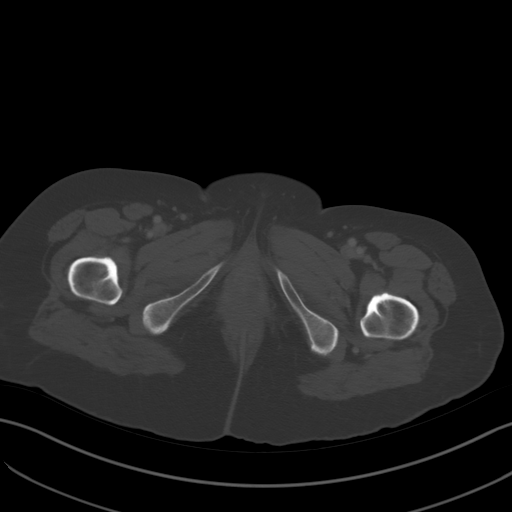
[im 14/88  soft-tissue]
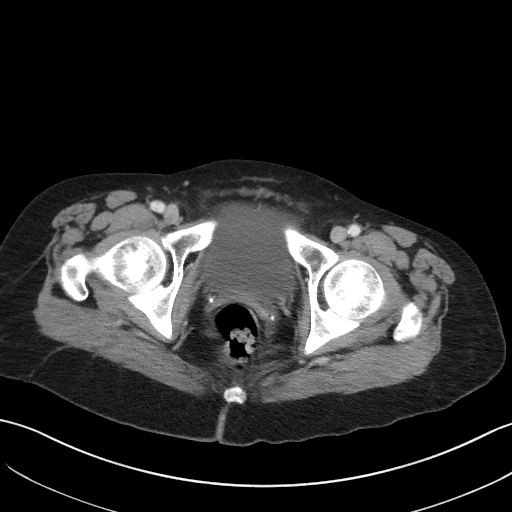
[im 18/88  soft-tissue]
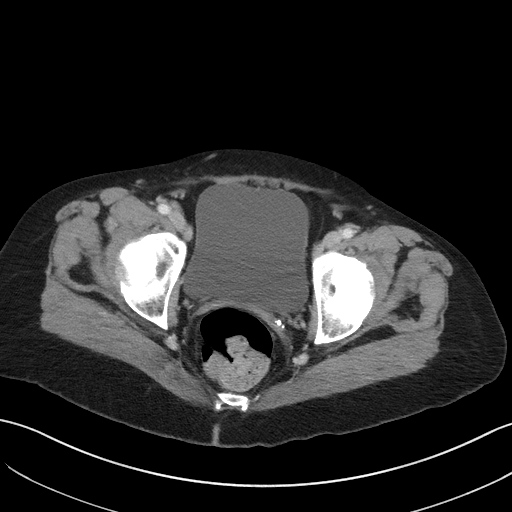
[im 27/88  soft-tissue]
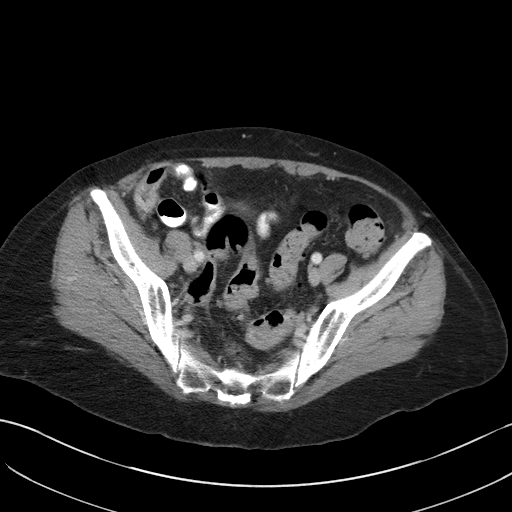
[im 31/88  soft-tissue]
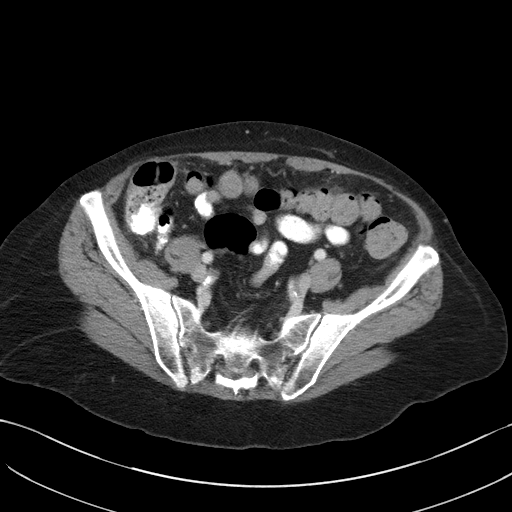
[im 40/88  soft-tissue]
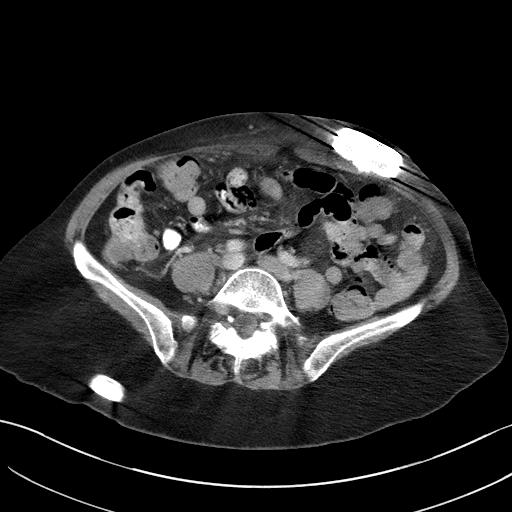
[im 44/88  soft-tissue]
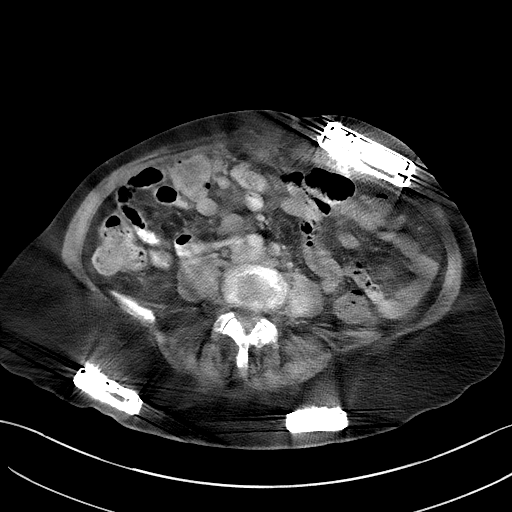
[im 48/88  soft-tissue]
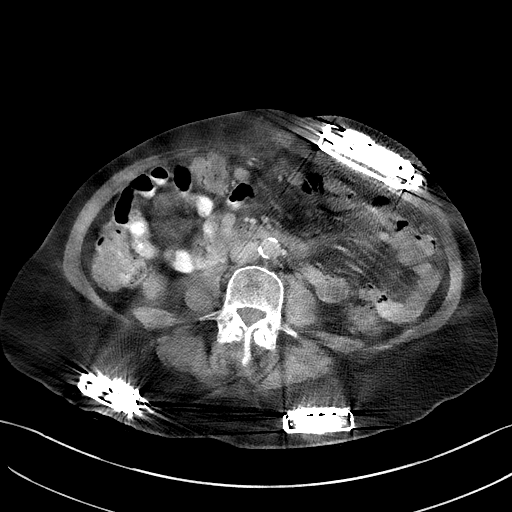
[im 57/88  soft-tissue]
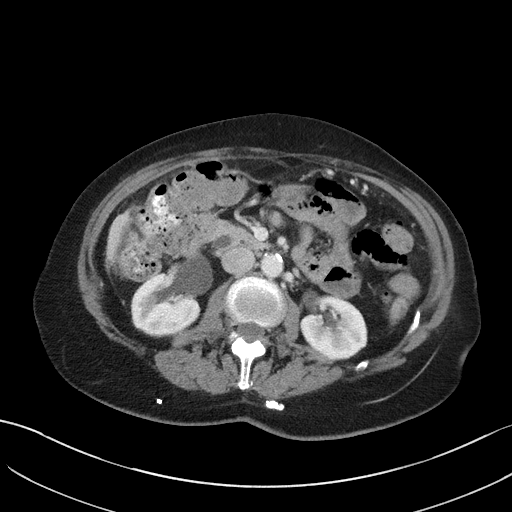
[im 57/88  bone]
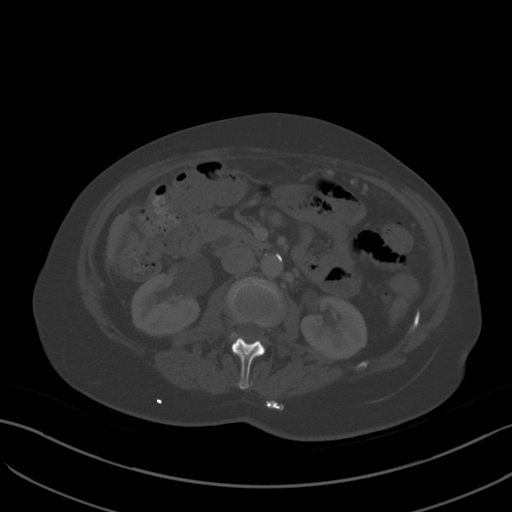
[im 61/88  soft-tissue]
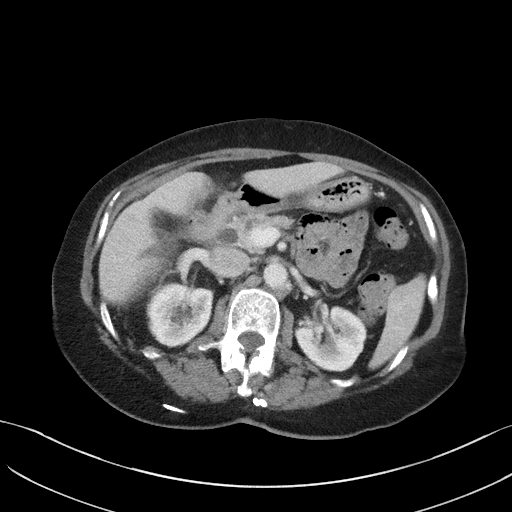
[im 70/88  soft-tissue]
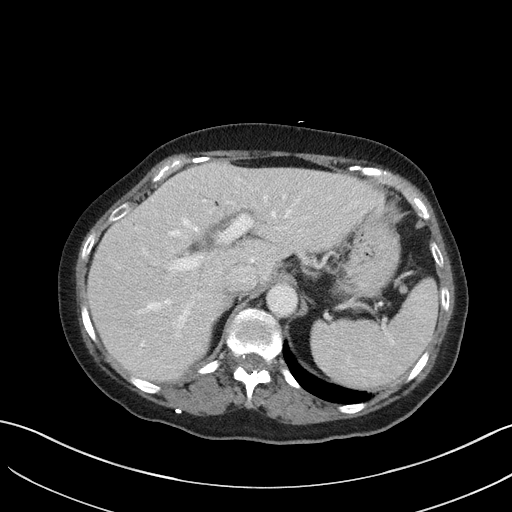
[im 74/88  soft-tissue]
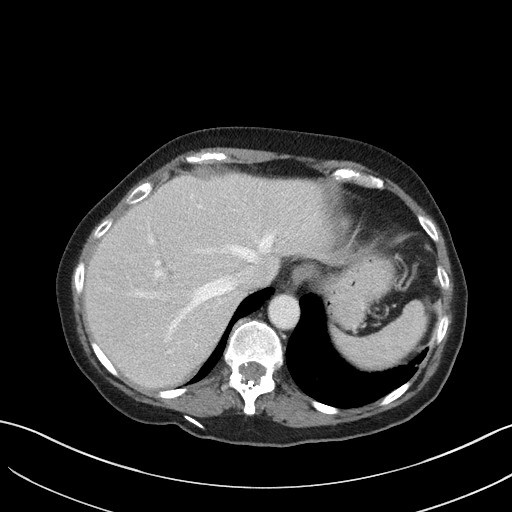
[im 83/88  soft-tissue]
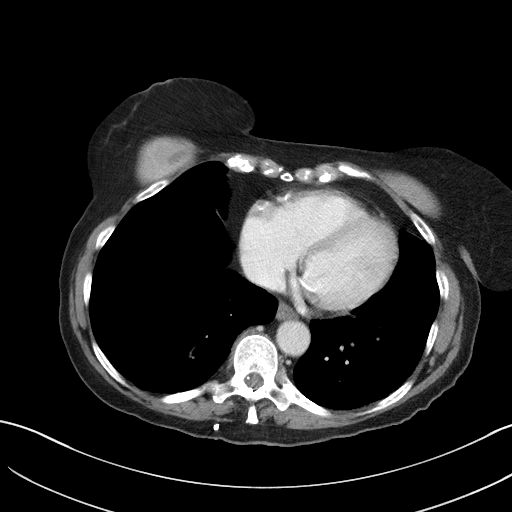

[Series 5: coronal st · coronal · 0.78mm/px · 3 of 82 slices shown]
[im 28/82  soft-tissue]
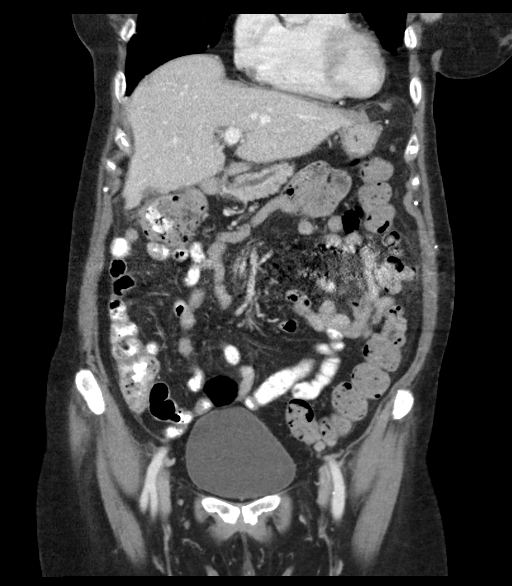
[im 37/82  soft-tissue]
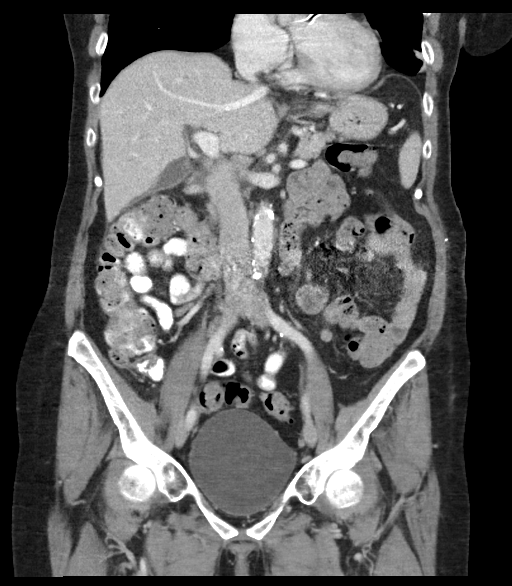
[im 46/82  soft-tissue]
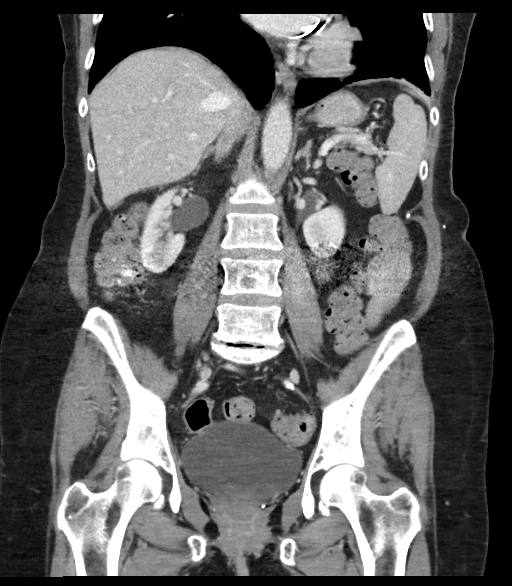

[16 of 46 positions shown; findings below may reference images not displayed]

FINDINGS: Lower chest: No acute pleural or parenchymal lung disease.

Hepatobiliary: Mild focal fatty infiltration within the right lobe
liver at the gallbladder fossa. No other focal liver abnormalities.
Postsurgical changes are seen from cholecystectomy. There is a small
amount of simple appearing fluid along the right lobe liver
measuring 1.6 x 2.7 cm, likely related to recent cholecystectomy.
Common bile duct measures 10 mm.

Pancreas: Unremarkable. No pancreatic ductal dilatation or
surrounding inflammatory changes.

Spleen: Normal in size without focal abnormality.

Adrenals/Urinary Tract: Adrenal glands are unremarkable. Kidneys are
normal, without renal calculi, focal lesion, or hydronephrosis.
Bladder is unremarkable.

Stomach/Bowel: No bowel obstruction or ileus. Moderate stool
throughout the colon. Scattered diverticulosis of the distal colon
without diverticulitis.

Vascular/Lymphatic: Aortic atherosclerosis. No enlarged abdominal or
pelvic lymph nodes.

Reproductive: Status post hysterectomy. No adnexal masses.

Other: No free fluid or free gas. Fat containing umbilical hernia.
Likely infusion pump within the left anterior abdominal wall. Spinal
cord stimulators are seen within the gluteal regions.

Musculoskeletal: No acute or destructive bony lesions. Bilateral
spondylolysis of L5 without spondylolisthesis. Reconstructed images
demonstrate no additional findings.
IMPRESSION: 1. Simple appearing fluid collection along the inferior margin right
lobe liver, likely related to recent cholecystectomy. Differential
diagnosis includes postoperative seroma, resolving postoperative
hematoma, or possibly biloma. If further evaluation is desired,
nuclear medicine hepatobiliary scan could be considered. No
inflammatory changes to suggest infection.
2. Moderate fecal retention.
3. Fat containing umbilical hernia.
4.  Aortic Atherosclerosis (UPRPL-MD8.8).
